# Patient Record
Sex: Male | Born: 1935 | Race: White | Hispanic: No | Marital: Married | Smoking: Former smoker
Health system: Southern US, Community
[De-identification: ages and names within clinical notes are randomized; demographics above are authoritative.]

## PROBLEM LIST (undated history)

## (undated) DIAGNOSIS — J45909 Unspecified asthma, uncomplicated: Secondary | ICD-10-CM

## (undated) DIAGNOSIS — D649 Anemia, unspecified: Secondary | ICD-10-CM

## (undated) DIAGNOSIS — C801 Malignant (primary) neoplasm, unspecified: Secondary | ICD-10-CM

## (undated) DIAGNOSIS — E785 Hyperlipidemia, unspecified: Secondary | ICD-10-CM

## (undated) DIAGNOSIS — E119 Type 2 diabetes mellitus without complications: Secondary | ICD-10-CM

## (undated) DIAGNOSIS — Z923 Personal history of irradiation: Secondary | ICD-10-CM

## (undated) HISTORY — DX: Unspecified asthma, uncomplicated: J45.909

## (undated) HISTORY — PX: EYE SURGERY: SHX253

## (undated) HISTORY — PX: ORIF ANKLE FRACTURE: SUR919

---

## 2001-03-15 ENCOUNTER — Encounter: Admission: RE | Admit: 2001-03-15 | Discharge: 2001-06-13 | Payer: Self-pay | Admitting: Internal Medicine

## 2001-06-07 ENCOUNTER — Ambulatory Visit (HOSPITAL_COMMUNITY): Admission: RE | Admit: 2001-06-07 | Discharge: 2001-06-07 | Payer: Self-pay | Admitting: Internal Medicine

## 2004-05-22 ENCOUNTER — Emergency Department (HOSPITAL_COMMUNITY): Admission: EM | Admit: 2004-05-22 | Discharge: 2004-05-22 | Payer: Self-pay | Admitting: Emergency Medicine

## 2004-05-24 ENCOUNTER — Inpatient Hospital Stay (HOSPITAL_COMMUNITY): Admission: RE | Admit: 2004-05-24 | Discharge: 2004-05-26 | Payer: Self-pay | Admitting: Orthopaedic Surgery

## 2007-07-24 ENCOUNTER — Emergency Department (HOSPITAL_COMMUNITY): Admission: EM | Admit: 2007-07-24 | Discharge: 2007-07-25 | Payer: Self-pay | Admitting: Emergency Medicine

## 2008-07-24 ENCOUNTER — Ambulatory Visit (HOSPITAL_COMMUNITY): Admission: RE | Admit: 2008-07-24 | Discharge: 2008-07-24 | Payer: Self-pay | Admitting: Internal Medicine

## 2009-11-17 ENCOUNTER — Ambulatory Visit (HOSPITAL_COMMUNITY): Admission: RE | Admit: 2009-11-17 | Discharge: 2009-11-17 | Payer: Self-pay | Admitting: Internal Medicine

## 2010-07-29 ENCOUNTER — Other Ambulatory Visit (HOSPITAL_COMMUNITY): Payer: Self-pay | Admitting: Internal Medicine

## 2010-07-29 ENCOUNTER — Ambulatory Visit (HOSPITAL_COMMUNITY)
Admission: RE | Admit: 2010-07-29 | Discharge: 2010-07-29 | Disposition: A | Discharge status: Home / Self Care | Payer: Medicare Other | Source: Ambulatory Visit | Attending: Internal Medicine | Admitting: Internal Medicine

## 2010-07-29 DIAGNOSIS — M545 Low back pain, unspecified: Secondary | ICD-10-CM | POA: Insufficient documentation

## 2010-07-29 DIAGNOSIS — M51379 Other intervertebral disc degeneration, lumbosacral region without mention of lumbar back pain or lower extremity pain: Secondary | ICD-10-CM | POA: Insufficient documentation

## 2010-07-29 DIAGNOSIS — M5137 Other intervertebral disc degeneration, lumbosacral region: Secondary | ICD-10-CM | POA: Insufficient documentation

## 2010-08-20 NOTE — Discharge Summary (Signed)
NAME:  Eric Brady, Eric Brady             ACCOUNT NO.:  [id number]   MEDICAL RECORD NO.:  [id number]          PATIENT TYPE:  INP   LOCATION:  A309                          FACILITY:  [facility]   PHYSICIAN:  J. Sanjuana Kava, M.D. DATE OF BIRTH:  08-Jul-1932   DATE OF ADMISSION:  05/24/2004  DATE OF DISCHARGE:  02/22/2006LH                                 DISCHARGE SUMMARY   CHIEF COMPLAINT:  Displaced medial malleolus fracture and nondisplaced  fracture of the lateral malleolus right ankle.  Noninsulin-dependent  diabetes mellitus.  Hyperlipidemia.   OPERATION:  Open treatment internal fixation right ankle fracture.   DISCHARGE DISPOSITION:  Patient is discharged home.  He is to follow up in  our office on June 08, 2004.  X-rays and plaster at that time.   DISCHARGE STATUS:  Improved.   DISCHARGE PROGNOSIS:  Good.   DISCHARGE MEDICATION:  Vicodin ES one tab q.4h. p.r.n. for pain.   BRIEF HISTORY AND PHYSICAL:  Please refer to the typed history and physical  in the patient's chart.   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  He was seen in the emergency room and was eventually referred to our office  for an evaluation on Monday of this week, it was noted at that time patient  had a displaced medial malleolus fracture, recommendation was that of  surgical treatment.  He also had a nondisplaced fracture of the fibula of  the right ankle.   Patient underwent open treatment internal fixation of the right ankle  fracture on May 24, 2004.  He tolerated procedure very well.  Postoperatively he was admitted to the hospital for pain control and further  instructions and physical therapy.   Patient has done well.  He had some complaints of dizziness and nausea every  time his head was elevated or he attempted to ambulate however, by the time  of discharge the patient was ambulating  toe-touch weightbearing 150 feet  with crutches with no assistance.   He remained afebrile in stable condition.   Patient was given the following instructions to continue ice and elevation  of the right lower extremity, crutch ambulation, no weightbearing to that  extremity, he is to keep it dry and clean, call the office with any  problems.      BC/MEDQ  D:  05/26/2004  T:  05/26/2004  Job:  [id number]

## 2010-08-20 NOTE — H&P (Signed)
NAME:  Eric Brady, Eric Brady             ACCOUNT NO.:  [id number]   MEDICAL RECORD NO.:  [id number]          PATIENT TYPE:  AMB   LOCATION:  DAY                           FACILITY:  [facility]   PHYSICIAN:  J. Sanjuana Kava, M.D. DATE OF BIRTH:  [birth year]   DATE OF ADMISSION:  05/24/2004  DATE OF DISCHARGE:  LH                                HISTORY & PHYSICAL   CHIEF COMPLAINT:  I hurt my ankle.   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  A cow kicked over a 4 x 6 gate, hit against his right ankle. He had pain and  tenderness. He had to take some cattle into [place], [place]. He  went ahead and did that and then came back home. Later that evening, he was  limping and having pain to his ankle. Complained about it during the night.  The next morning, he complained about it, and his wife brought him to the  hospital. X-rays revealed bimalleolar fracture of the right ankle with some  displacement of the medial malleolus. Put in a posterior splint, told to  come see me this morning. X-ray shows some separation medially over the  medial malleolus. Lateral malleolus is nondisplaced. Because of the  separation medially, he is going to need open treatment internal fixation. I  have explained this to the patient and his wife. Risks and imponderables of  the surgery are discussed including infection, pulmonary embolism, need for  physical therapy, no to limited weight bearing for 6 to 8 weeks.   PAST MEDICAL HISTORY:  Positive for diabetes, diet controlled. He denies  heart disease, lung disease, kidney disease, stroke, paralysis, TB,  rheumatic fever, cancer, polio, ulcer disease, or circulatory problems.   ALLERGIES:  He denies any allergies.   MEDICATIONS:  He is currently taking Lipitor 5 mg a day and one baby aspirin  a day.   SOCIAL HISTORY:  He does smoke, does not drink. 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 Dr. Willey Blade is his family doctor. The patient  lives in [place], and  he is married.   PAST SURGICAL HISTORY:  His only surgery is cataract surgery 6 or 7 years  ago.   FAMILY HISTORY:  Denies any diseases that run in the family.   PHYSICAL EXAMINATION:  VITAL SIGNS:  BP is 140/80, pulse 60, respiratory  rate 16, afebrile. Height 5 foot 10-1/2 inches, weight 183.  GENERAL:  The patient alert and cooperative, oriented.  HEENT:  Negative.  NECK:  Supple.  LUNGS:  Clear to P&A.  HEART:  Regular without murmur heard.  ABDOMEN:  Soft, nontender without masses.  EXTREMITIES:  Right lower leg swollen, nontender, ecchymotic. There is no  erythema. Neurovascular intact. Other extremities within normal limits. No  other apparent injuries.  CENTRAL NERVOUS SYSTEM:  Intact.  SKIN:  Intact.   IMPRESSION:  Bimalleolar fracture of right ankle.   PLAN:  Open treatment and internal fixation. Plan to do his surgery later  today. I have talked to the anesthesiologist, and by the time we do the  surgery, he had been NPO long enough to safely proceed. The patient did have  oatmeal early this morning. It will be greater than 6 hours when his surgery  is done. He knows about being NPO. He is going to be directly admitted to  the hospital.      JWK/MEDQ  D:  05/24/2004  T:  05/24/2004  Job:  [id number]

## 2010-08-20 NOTE — Op Note (Signed)
NAME:  Eric Brady, Eric Brady             ACCOUNT NO.:  [id number]   MEDICAL RECORD NO.:  [id number]          PATIENT TYPE:  AMB   LOCATION:  DAY                           FACILITY:  [facility]   PHYSICIAN:  J. Sanjuana Kava, M.D. DATE OF BIRTH:  1933-02-03   DATE OF PROCEDURE:  05/24/2004  DATE OF DISCHARGE:                                 OPERATIVE REPORT   PREOPERATIVE DIAGNOSIS:  Bimalleolar fracture of the right ankle.   POSTOPERATIVE DIAGNOSIS:  Bimalleolar fracture of the right ankle.   PROCEDURE:  Open reduction and internal treatment of the ankle on the right.   SURGEON:  J. Sanjuana Kava, M.D.   ASSISTANT:  Adolphus Birchwood, P.A.-C.   ANESTHESIA:  Spinal.   TOURNIQUET TIME:  9 minutes.   No drains.  Posterior splint applied.   INDICATION FOR PHYSICIAN ASSISTANT:  We are a small community hospital.  There is no house staff.  Physician assistant helps obtain placement of the  screw, casting.   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  He came to the office today.  The fracture  medially is displaced.  I saw him earlier this morning and I recommended  surgery, put him on the schedule.  Risks and imponderables were discussed in  detail.  Both the patient and his wife appeared to understand and agreed to  the procedure as outlined.   DESCRIPTION OF PROCEDURE:  The patient was seen in the holding area with his  wife.  The right leg has been marked previously by the patient.  I marked  it.  The patient was taken to the operating room, given spinal anesthesia,  placed supine on the operating table.  The tourniquet placed deflated on the  right upper leg.  The patient prepped and draped in the usual manner.  The  leg was elevated and wrapped circumferentially with an Esmarch bandage.  We  again identified the patient and the right ankle.  Esmarch removed.   A  medial incision was made with retraction, medial malleolus identified,  removed the hematoma and anatomically held it in place with the clamp.  A  0.062 smooth Kirschner wire was placed and then a 3.2 drill and then a 55 mm  malleolar screw was inserted.  The wound was reapproximated using 2-0  chromic, 2-0 plain and skin staples.  The tourniquet deflated after nine  minutes.  X-  ray showed excellent reduction of the fracture.  I did not fix the lateral  side because it was anatomically in place and I did not see that placing a  plate or screw would help that any more.  The patient placed in a sterile  dressing, posterior splint.  Tolerated the procedure well and will go to the  recovery room in good condition.      JWK/MEDQ  D:  05/24/2004  T:  05/24/2004  Job:  [id number]

## 2011-08-12 ENCOUNTER — Encounter (INDEPENDENT_AMBULATORY_CARE_PROVIDER_SITE_OTHER): Payer: Self-pay | Admitting: *Deleted

## 2012-08-02 ENCOUNTER — Telehealth (INDEPENDENT_AMBULATORY_CARE_PROVIDER_SITE_OTHER): Payer: Self-pay | Admitting: *Deleted

## 2012-08-02 ENCOUNTER — Other Ambulatory Visit (INDEPENDENT_AMBULATORY_CARE_PROVIDER_SITE_OTHER): Payer: Self-pay | Admitting: *Deleted

## 2012-08-02 ENCOUNTER — Encounter (INDEPENDENT_AMBULATORY_CARE_PROVIDER_SITE_OTHER): Payer: Self-pay | Admitting: *Deleted

## 2012-08-02 DIAGNOSIS — Z1211 Encounter for screening for malignant neoplasm of colon: Secondary | ICD-10-CM

## 2012-08-02 MED ORDER — PEG-KCL-NACL-NASULF-NA ASC-C 100 G PO SOLR: 1.0000 | Freq: Once | ORAL | Status: DC

## 2012-08-02 NOTE — Telephone Encounter (Signed)
Patient needs movi prep 

## 2012-08-15 ENCOUNTER — Telehealth (INDEPENDENT_AMBULATORY_CARE_PROVIDER_SITE_OTHER): Payer: Self-pay | Admitting: *Deleted

## 2012-08-15 NOTE — Telephone Encounter (Signed)
  Procedure: tcs  Reason/Indication:  screening  Has patient had this procedure before?  Yes, 10-11 yrs ago  If so, when, by whom and where?    Is there a family history of colon cancer?  no  Who?  What age when diagnosed?    Is patient diabetic?   yes      Does patient have prosthetic heart valve?  no  Do you have a pacemaker?  no  Has patient ever had endocarditis? no  Has patient had joint replacement within last 12 months?  no  Is patient on Coumadin, Plavix and/or Aspirin? yes  Medications: metformin 500 mg 2 in am & 2 in pm, atorvastatin 10 mg daily, asa 81 mg daily  Allergies: nkda  Medication Adjustment: asa 2 days, hold metformin evening before procedure and morning of procedure  Procedure date & time: 09/12/12 at 915

## 2012-08-16 NOTE — Telephone Encounter (Signed)
agree

## 2012-08-29 ENCOUNTER — Encounter (HOSPITAL_COMMUNITY): Payer: Self-pay | Admitting: Pharmacy Technician

## 2012-09-12 ENCOUNTER — Encounter (HOSPITAL_COMMUNITY)
Admission: RE | Disposition: A | Discharge status: Home / Self Care | Payer: Self-pay | Source: Ambulatory Visit | Attending: Internal Medicine

## 2012-09-12 ENCOUNTER — Encounter (HOSPITAL_COMMUNITY): Payer: Self-pay | Admitting: *Deleted

## 2012-09-12 ENCOUNTER — Ambulatory Visit (HOSPITAL_COMMUNITY)
Admission: RE | Admit: 2012-09-12 | Discharge: 2012-09-12 | Disposition: A | Discharge status: Home / Self Care | Payer: Medicare Other | Source: Ambulatory Visit | Attending: Internal Medicine | Admitting: Internal Medicine

## 2012-09-12 DIAGNOSIS — E785 Hyperlipidemia, unspecified: Secondary | ICD-10-CM | POA: Insufficient documentation

## 2012-09-12 DIAGNOSIS — K5730 Diverticulosis of large intestine without perforation or abscess without bleeding: Secondary | ICD-10-CM

## 2012-09-12 DIAGNOSIS — K644 Residual hemorrhoidal skin tags: Secondary | ICD-10-CM

## 2012-09-12 DIAGNOSIS — Z79899 Other long term (current) drug therapy: Secondary | ICD-10-CM | POA: Insufficient documentation

## 2012-09-12 DIAGNOSIS — Z87891 Personal history of nicotine dependence: Secondary | ICD-10-CM | POA: Insufficient documentation

## 2012-09-12 DIAGNOSIS — D126 Benign neoplasm of colon, unspecified: Secondary | ICD-10-CM

## 2012-09-12 DIAGNOSIS — Z7982 Long term (current) use of aspirin: Secondary | ICD-10-CM | POA: Insufficient documentation

## 2012-09-12 DIAGNOSIS — Z1211 Encounter for screening for malignant neoplasm of colon: Secondary | ICD-10-CM

## 2012-09-12 DIAGNOSIS — E119 Type 2 diabetes mellitus without complications: Secondary | ICD-10-CM | POA: Insufficient documentation

## 2012-09-12 DIAGNOSIS — D1779 Benign lipomatous neoplasm of other sites: Secondary | ICD-10-CM | POA: Insufficient documentation

## 2012-09-12 HISTORY — DX: Hyperlipidemia, unspecified: E78.5

## 2012-09-12 HISTORY — DX: Type 2 diabetes mellitus without complications: E11.9

## 2012-09-12 HISTORY — PX: COLONOSCOPY: SHX5424

## 2012-09-12 LAB — GLUCOSE, CAPILLARY: Glucose-Capillary: 89 mg/dL (ref 70–99)

## 2012-09-12 SURGERY — COLONOSCOPY
Anesthesia: Moderate Sedation

## 2012-09-12 MED ORDER — MEPERIDINE HCL 50 MG/ML IJ SOLN
INTRAMUSCULAR | Status: DC | PRN
  Administered 2012-09-12: 25 mg via INTRAVENOUS

## 2012-09-12 MED ORDER — MIDAZOLAM HCL 5 MG/5ML IJ SOLN
INTRAMUSCULAR | Status: DC | PRN
  Administered 2012-09-12: 1 mg via INTRAVENOUS
  Administered 2012-09-12: 2 mg via INTRAVENOUS

## 2012-09-12 MED ORDER — MIDAZOLAM HCL 5 MG/5ML IJ SOLN
INTRAMUSCULAR | Status: AC
  Filled 2012-09-12: qty 10

## 2012-09-12 MED ORDER — MEPERIDINE HCL 50 MG/ML IJ SOLN
INTRAMUSCULAR | Status: AC
  Filled 2012-09-12: qty 1

## 2012-09-12 MED ORDER — STERILE WATER FOR IRRIGATION IR SOLN
Status: DC | PRN
  Administered 2012-09-12: 09:00:00

## 2012-09-12 MED ORDER — SODIUM CHLORIDE 0.9 % IV SOLN
INTRAVENOUS | Status: DC
  Administered 2012-09-12: 09:00:00 via INTRAVENOUS

## 2012-09-12 NOTE — H&P (Signed)
Eric Brady is an 79 y.o. male.   Chief Complaint: Patient is here for colonoscopy. HPI: Patient is 79 year old Caucasian male who presents for screening colonoscopy. His last exam was in 2003. He denies abdominal pain change in his bowel habits or rectal bleeding. Family history is negative for CRC.  Past Medical History  Diagnosis Date  . Hyperlipidemia   . Diabetes mellitus without complication     Past Surgical History  Procedure Laterality Date  . Orif ankle fracture Right     History reviewed. No pertinent family history. Social History:  reports that he has quit smoking. He does not have any smokeless tobacco history on file. He reports that he does not drink alcohol or use illicit drugs.  Allergies: No Known Allergies  Medications Prior to Admission  Medication Sig Dispense Refill  . aspirin EC 81 MG tablet Take 81 mg by mouth daily.      Marland Kitchen atorvastatin (LIPITOR) 10 MG tablet Take 10 mg by mouth daily.      . metFORMIN (GLUCOPHAGE) 500 MG tablet Take 1,000 mg by mouth 2 (two) times daily with a meal.        Results for orders placed during the hospital encounter of 09/12/12 (from the past 48 hour(s))  GLUCOSE, CAPILLARY     Status: None   Collection Time    09/12/12  8:36 AM      Result Value Range   Glucose-Capillary 89  70 - 99 mg/dL   No results found.  ROS  Blood pressure 143/88, pulse 46, temperature 98 F (36.7 C), resp. rate 18, height 5\' 11"  (1.803 m), weight 180 lb (81.647 kg), SpO2 99.00%. Physical Exam  Constitutional: He appears well-developed and well-nourished.  HENT:  Mouth/Throat: Oropharynx is clear and moist.  Eyes: Conjunctivae are normal. No scleral icterus.  Neck: No thyromegaly present.  Cardiovascular: Normal rate, regular rhythm and normal heart sounds.   No murmur heard. Respiratory: Effort normal and breath sounds normal.  GI: Soft. He exhibits no distension and no mass. There is no tenderness.  Musculoskeletal: He exhibits no  edema.  Lymphadenopathy:    He has no cervical adenopathy.  Neurological: He is alert.  Skin: Skin is warm and dry.     Assessment/Plan Average risk screening colonoscopy.  Brady,Eric U 09/12/2012, 9:28 AM

## 2012-09-12 NOTE — Op Note (Signed)
COLONOSCOPY PROCEDURE REPORT  PATIENT:  Eric Brady  MR#:  [id number] Birthdate:  06/20/1932, 79 y.o., male Endoscopist:  Dr. Rogene Houston, MD Referred By:  Dr. Asencion Noble, MD  Procedure Date: 09/12/2012  Procedure:   Colonoscopy  Indications:  Patient is 79 year old Caucasian male who is here for average risk screening colonoscopy. His last exam was in 2003.  Informed Consent:  The procedure and risks were reviewed with the patient and informed consent was obtained.  Medications:  Demerol 25 mg IV Versed 3 mg IV  Description of procedure:  After a digital rectal exam was performed, that colonoscope was advanced from the anus through the rectum and colon to the area of the cecum, ileocecal valve and appendiceal orifice. The cecum was deeply intubated. These structures were well-seen and photographed for the record. From the level of the cecum and ileocecal valve, the scope was slowly and cautiously withdrawn. The mucosal surfaces were carefully surveyed utilizing scope tip to flexion to facilitate fold flattening as needed. The scope was pulled down into the rectum where a thorough exam including retroflexion was performed.  Findings:   Prep excellent. Few scattered diverticula throughout the colon. 12 dimeter submucosal lipoma noted at ascending colon above the ileocecal valve. Small cecal polyp ablated via cold biopsy. 5 mm polyp in transverse colon cold snared. Both of these polyps were submitted together. Normal rectal mucosa. Small hemorrhoids below the dentate line.    Therapeutic/Diagnostic Maneuvers Performed:  See above  Complications:  None  Cecal Withdrawal Time:  12 minutes  Impression:  Examination performed to cecum. Pancolonic diverticulosis. 12 mm submucosal lipoma at ascending colon. Two small polyps were removed and submitted together; one from cecum removed via cold biopsy and the second one was cold snared from transverse colon.  Recommendations:   Standard instructions given. I will contact patient with biopsy results and further recommendations.  [name]  09/12/2012 10:09 AM  CC: Dr. Asencion Noble, MD & Dr. Rayne Du ref. provider found

## 2012-09-13 ENCOUNTER — Encounter (HOSPITAL_COMMUNITY): Payer: Self-pay | Admitting: Internal Medicine

## 2012-09-19 ENCOUNTER — Encounter (INDEPENDENT_AMBULATORY_CARE_PROVIDER_SITE_OTHER): Payer: Self-pay | Admitting: *Deleted

## 2014-05-29 ENCOUNTER — Other Ambulatory Visit (HOSPITAL_COMMUNITY): Payer: Self-pay | Admitting: Internal Medicine

## 2014-05-29 ENCOUNTER — Ambulatory Visit (HOSPITAL_COMMUNITY)
Admission: RE | Admit: 2014-05-29 | Discharge: 2014-05-29 | Disposition: A | Discharge status: Home / Self Care | Payer: Medicare Other | Source: Ambulatory Visit | Attending: Internal Medicine | Admitting: Internal Medicine

## 2014-05-29 DIAGNOSIS — W109XXA Fall (on) (from) unspecified stairs and steps, initial encounter: Secondary | ICD-10-CM | POA: Diagnosis not present

## 2014-05-29 DIAGNOSIS — M542 Cervicalgia: Secondary | ICD-10-CM

## 2014-05-29 DIAGNOSIS — W19XXXA Unspecified fall, initial encounter: Secondary | ICD-10-CM

## 2014-05-29 DIAGNOSIS — M479 Spondylosis, unspecified: Secondary | ICD-10-CM | POA: Insufficient documentation

## 2014-11-10 ENCOUNTER — Other Ambulatory Visit (HOSPITAL_COMMUNITY): Payer: Self-pay | Admitting: Internal Medicine

## 2014-11-10 DIAGNOSIS — R748 Abnormal levels of other serum enzymes: Secondary | ICD-10-CM

## 2014-11-11 ENCOUNTER — Ambulatory Visit (HOSPITAL_COMMUNITY)
Admission: RE | Admit: 2014-11-11 | Discharge: 2014-11-11 | Disposition: A | Discharge status: Home / Self Care | Payer: Medicare Other | Source: Ambulatory Visit | Attending: Internal Medicine | Admitting: Internal Medicine

## 2014-11-11 ENCOUNTER — Other Ambulatory Visit (HOSPITAL_COMMUNITY): Payer: Self-pay | Admitting: Internal Medicine

## 2014-11-11 DIAGNOSIS — N2889 Other specified disorders of kidney and ureter: Secondary | ICD-10-CM | POA: Diagnosis not present

## 2014-11-11 DIAGNOSIS — R748 Abnormal levels of other serum enzymes: Secondary | ICD-10-CM

## 2014-11-11 DIAGNOSIS — R944 Abnormal results of kidney function studies: Secondary | ICD-10-CM | POA: Diagnosis present

## 2014-11-11 DIAGNOSIS — R531 Weakness: Secondary | ICD-10-CM | POA: Insufficient documentation

## 2014-11-11 DIAGNOSIS — R0602 Shortness of breath: Secondary | ICD-10-CM

## 2014-11-12 ENCOUNTER — Other Ambulatory Visit (HOSPITAL_COMMUNITY): Payer: Self-pay | Admitting: Internal Medicine

## 2014-11-12 DIAGNOSIS — N2889 Other specified disorders of kidney and ureter: Secondary | ICD-10-CM

## 2014-11-12 DIAGNOSIS — R42 Dizziness and giddiness: Secondary | ICD-10-CM

## 2014-11-14 ENCOUNTER — Ambulatory Visit (HOSPITAL_COMMUNITY)
Admission: RE | Admit: 2014-11-14 | Discharge: 2014-11-14 | Disposition: A | Discharge status: Home / Self Care | Payer: Medicare Other | Source: Ambulatory Visit | Attending: Internal Medicine | Admitting: Internal Medicine

## 2014-11-14 DIAGNOSIS — K5730 Diverticulosis of large intestine without perforation or abscess without bleeding: Secondary | ICD-10-CM | POA: Diagnosis not present

## 2014-11-14 DIAGNOSIS — N323 Diverticulum of bladder: Secondary | ICD-10-CM | POA: Insufficient documentation

## 2014-11-14 DIAGNOSIS — N2889 Other specified disorders of kidney and ureter: Secondary | ICD-10-CM | POA: Diagnosis present

## 2014-11-14 DIAGNOSIS — R42 Dizziness and giddiness: Secondary | ICD-10-CM

## 2014-11-14 MED ORDER — SODIUM CHLORIDE 0.9 % IJ SOLN
INTRAMUSCULAR | Status: AC
  Filled 2014-11-14: qty 500

## 2014-11-14 MED ORDER — SODIUM CHLORIDE 0.9 % IJ SOLN
INTRAMUSCULAR | Status: AC
  Filled 2014-11-14: qty 30

## 2014-11-14 MED ORDER — IOHEXOL 300 MG/ML  SOLN
80.0000 mL | Freq: Once | INTRAMUSCULAR | Status: AC | PRN
  Administered 2014-11-14: 80 mL via INTRAVENOUS

## 2014-12-09 ENCOUNTER — Other Ambulatory Visit (HOSPITAL_COMMUNITY): Payer: Self-pay | Admitting: Internal Medicine

## 2014-12-09 ENCOUNTER — Ambulatory Visit (HOSPITAL_COMMUNITY)
Admission: RE | Admit: 2014-12-09 | Discharge: 2014-12-09 | Disposition: A | Discharge status: Home / Self Care | Payer: Medicare Other | Source: Ambulatory Visit | Attending: Internal Medicine | Admitting: Internal Medicine

## 2014-12-09 DIAGNOSIS — R262 Difficulty in walking, not elsewhere classified: Secondary | ICD-10-CM | POA: Insufficient documentation

## 2014-12-09 DIAGNOSIS — R410 Disorientation, unspecified: Secondary | ICD-10-CM

## 2014-12-09 DIAGNOSIS — R4182 Altered mental status, unspecified: Secondary | ICD-10-CM | POA: Insufficient documentation

## 2014-12-09 DIAGNOSIS — R42 Dizziness and giddiness: Secondary | ICD-10-CM | POA: Insufficient documentation

## 2014-12-09 DIAGNOSIS — I6789 Other cerebrovascular disease: Secondary | ICD-10-CM | POA: Insufficient documentation

## 2014-12-09 DIAGNOSIS — R269 Unspecified abnormalities of gait and mobility: Secondary | ICD-10-CM

## 2014-12-26 ENCOUNTER — Encounter (HOSPITAL_COMMUNITY): Payer: Self-pay | Admitting: *Deleted

## 2014-12-26 ENCOUNTER — Emergency Department (HOSPITAL_COMMUNITY)
Admission: EM | Admit: 2014-12-26 | Discharge: 2014-12-26 | Disposition: A | Discharge status: Home / Self Care | Payer: Medicare Other | Attending: Emergency Medicine | Admitting: Emergency Medicine

## 2014-12-26 DIAGNOSIS — E785 Hyperlipidemia, unspecified: Secondary | ICD-10-CM | POA: Diagnosis not present

## 2014-12-26 DIAGNOSIS — Z79899 Other long term (current) drug therapy: Secondary | ICD-10-CM | POA: Insufficient documentation

## 2014-12-26 DIAGNOSIS — Z87891 Personal history of nicotine dependence: Secondary | ICD-10-CM | POA: Insufficient documentation

## 2014-12-26 DIAGNOSIS — E119 Type 2 diabetes mellitus without complications: Secondary | ICD-10-CM | POA: Diagnosis not present

## 2014-12-26 DIAGNOSIS — R29898 Other symptoms and signs involving the musculoskeletal system: Secondary | ICD-10-CM

## 2014-12-26 DIAGNOSIS — R531 Weakness: Secondary | ICD-10-CM | POA: Diagnosis present

## 2014-12-26 DIAGNOSIS — M6281 Muscle weakness (generalized): Secondary | ICD-10-CM | POA: Diagnosis not present

## 2014-12-26 LAB — BASIC METABOLIC PANEL
ANION GAP: 7 (ref 5–15)
BUN: 19 mg/dL (ref 6–20)
CHLORIDE: 109 mmol/L (ref 101–111)
CO2: 26 mmol/L (ref 22–32)
Calcium: 8.8 mg/dL — ABNORMAL LOW (ref 8.9–10.3)
Creatinine, Ser: 1.34 mg/dL — ABNORMAL HIGH (ref 0.61–1.24)
GFR calc Af Amer: 56 mL/min — ABNORMAL LOW (ref >60)
GFR, EST NON AFRICAN AMERICAN: 49 mL/min — AB (ref >60)
GLUCOSE: 170 mg/dL — AB (ref 65–99)
POTASSIUM: 3.9 mmol/L (ref 3.5–5.1)
Sodium: 142 mmol/L (ref 135–145)

## 2014-12-26 LAB — CBC WITH DIFFERENTIAL/PLATELET
BASOS ABS: 0 10*3/uL (ref 0.0–0.1)
Basophils Relative: 0 %
EOS PCT: 1 %
Eosinophils Absolute: 0.1 10*3/uL (ref 0.0–0.7)
HEMATOCRIT: 34.5 % — AB (ref 39.0–52.0)
HEMOGLOBIN: 10.8 g/dL — AB (ref 13.0–17.0)
LYMPHS ABS: 2.5 10*3/uL (ref 0.7–4.0)
LYMPHS PCT: 29 %
MCH: 27.5 pg (ref 26.0–34.0)
MCHC: 31.3 g/dL (ref 30.0–36.0)
MCV: 87.8 fL (ref 78.0–100.0)
Monocytes Absolute: 0.7 10*3/uL (ref 0.1–1.0)
Monocytes Relative: 8 %
NEUTROS ABS: 5.3 10*3/uL (ref 1.7–7.7)
NEUTROS PCT: 62 %
PLATELETS: 142 10*3/uL — AB (ref 150–400)
RBC: 3.93 MIL/uL — AB (ref 4.22–5.81)
RDW: 14 % (ref 11.5–15.5)
WBC: 8.5 10*3/uL (ref 4.0–10.5)

## 2014-12-26 LAB — CBG MONITORING, ED: GLUCOSE-CAPILLARY: 164 mg/dL — AB (ref 65–99)

## 2014-12-26 NOTE — Discharge Instructions (Signed)
Hypotonia Hypotonia is decreased muscle tone. Muscle tone is the amount of tension or resistance to movement in a muscle while at rest. It is different from muscle strength which is how much force a muscle can apply. For example, you may be able to do sit-ups which are a measure of abdominal muscle strength but if the belly is not firm then there is low muscle tone. Many people with hypotonia also have muscle weakness. Hypotonic infants are also called "floppy infants" because the low muscle tone and decreased strength of the neck and trunk muscles make the children floppy like a rag doll. CAUSES  Hypotonia is caused by a problem in one of the following areas of the body:  Brain.  Spinal cord.  Nerves.  Junction between the nerves and muscle.  Muscle. SYMPTOMS IN FLOPPY INFANTS  Delay in motor skills and development.  Shortness of breath or fast shallow breaths.  Poor sucking ability leading to poor weight gain.  Decreased alertness.  Joints can be bent further than expected due to ligament and joint laxity.  Hypotonia may be associated with disorders that can affect intellect and learning. SYMPTOMS IN OLDER CHILDREN / ADULTS  Low muscle tone and/or weakness which may be over the entire body or affect only a specific region.  Fluctuating muscle strength.  Weight loss due to fatigue during eating.  Double vision due to weakness of the eye muscles.  Decreased energy. DIAGNOSIS  Depending upon how and at what age the patient develops hypotonia, the pattern of weakness, and physical examination, your physician may order the following tests to find out which part of the area of the body is involved :  Magnetic Resonance Imaging (MRI) of the Brain and/or Spinal Cord - a way to look at the structure of the brain and spinal cord without radiation.  Electromyogram with Nerve Conduction Studies (EMG with NCS) - a way to evaluate the function of the nerves, junction between the nerves  and muscle and muscle. This test may be painful, but it is the only way to test for many different diseases.  Based on the above tests, further testing may be done to narrow down the exact cause of why that part of the body is not working properly. Depending on the cause, the hypotonia may be a short term problem that is treatable or a life-long problem that is untreatable or something in-between. TREATMENT  Treatment will be based on the disease that caused the hypotonia first. This is accompanied by symptomatic and supportive therapy for the hypotonia. Physical therapy can improve fine and movement (gross motor) control and overall body strength. Occupational therapy focuses on fine motor skills and basic skills for life. Speech therapy focuses on speech and swallowing difficulties.  Document Released: 03/11/2002 Document Revised: 06/13/2011 Document Reviewed: 10/14/2008 [facility] Patient Information 2015 [place], [place]. This information is not intended to replace advice given to you by your health care provider. Make sure you discuss any questions you have with your health care provider.

## 2014-12-26 NOTE — ED Provider Notes (Signed)
CSN: [id number]     Arrival date & time 12/26/14  2116 History  This chart was scribed for Deno Etienne, DO by Irene Pap, ED Scribe. This patient was seen in room APA05/APA05 and patient care was started at 9:29 PM.    Chief Complaint  Patient presents with  . Weakness   Patient is a 81 y.o. male presenting with weakness. The history is provided by the patient and the spouse. No language interpreter was used.  Weakness This is a new problem. The current episode started 1 to 2 hours ago. The problem occurs constantly. The problem has been gradually improving. Pertinent negatives include no chest pain, no abdominal pain, no headaches and no shortness of breath. He has tried nothing for the symptoms.   HPI Comments: Eric Brady is a 81 y.o. male with hx of DM who presents to the Emergency Department complaining of weakness onset one hour ago. Pt states that his wife made him come in because at 8 PM, he was unable to move or walk. Wife states that she was able to get him to his bed after he complained of weakness. He states that he has not tried to walk since, but feels like he is returning to baseline. Pt states that he has no prior hx of similar symptoms but wife states that he has a B12 deficiency which may contribute to his "wobbly" gait. Pt is followed by a neurologist at [facility]. Wife states that pt went outside to fix the fence and came back inside with deer ticks on his legs. He denies arm weakness, headache, neck pain, back pain, chest pain, leg pain, dizziness, lightheadedness, nausea, or vomiting.   Past Medical History  Diagnosis Date  . Hyperlipidemia   . Diabetes mellitus without complication    Past Surgical History  Procedure Laterality Date  . Orif ankle fracture Right   . Colonoscopy N/A 09/12/2012    Procedure: COLONOSCOPY;  Surgeon: Rogene Houston, MD;  Location: [facility];  Service: Endoscopy;  Laterality: N/A;  915   History reviewed. No pertinent family  history. Social History  Substance Use Topics  . Smoking status: Former Research scientist (life sciences)  . Smokeless tobacco: None  . Alcohol Use: No    Review of Systems  Constitutional: Negative for fever and chills.  HENT: Negative for congestion and facial swelling.   Eyes: Negative for discharge and visual disturbance.  Respiratory: Negative for shortness of breath.   Cardiovascular: Negative for chest pain and palpitations.  Gastrointestinal: Negative for nausea, vomiting, abdominal pain and diarrhea.  Musculoskeletal: Negative for myalgias, back pain, arthralgias and neck pain.  Skin: Negative for color change and rash.  Neurological: Positive for weakness. Negative for dizziness, tremors, syncope, numbness and headaches.  Psychiatric/Behavioral: Negative for confusion and dysphoric mood.   Allergies  Review of patient's allergies indicates no known allergies.  Home Medications   Prior to Admission medications   Medication Sig Start Date End Date Taking? Authorizing Provider  atorvastatin (LIPITOR) 10 MG tablet Take 10 mg by mouth daily.   Yes Historical Provider, MD   BP 152/72 mmHg  Pulse 60  SpO2 98% Physical Exam  Constitutional: He is oriented to person, place, and time. He appears well-developed and well-nourished.  HENT:  Head: Normocephalic and atraumatic.  Eyes: Conjunctivae and EOM are normal. Pupils are equal, round, and reactive to light.  Neck: Normal range of motion. Neck supple.  Cardiovascular: Normal rate and regular rhythm.  Exam reveals no gallop and  no friction rub.   No murmur heard. Pulmonary/Chest: Effort normal and breath sounds normal.  Abdominal: Soft. Bowel sounds are normal. There is no tenderness. There is no rebound and no guarding.  Musculoskeletal: Normal range of motion.  Neurological: He is alert and oriented to person, place, and time.  Skin: Skin is warm and dry.  Small tick noted to anterior aspect of left shin  Psychiatric: He has a normal mood and  affect. His behavior is normal.  Nursing note and vitals reviewed.   ED Course  Procedures (including critical care time) DIAGNOSTIC STUDIES: Oxygen Saturation is 98% on RA, normal by my interpretation.    COORDINATION OF CARE: 9:35 PM-Discussed treatment plan which includes labs with pt at bedside and pt agreed to plan.   Labs Review Labs Reviewed  CBC WITH DIFFERENTIAL/PLATELET - Abnormal; Notable for the following:    RBC 3.93 (*)    Hemoglobin 10.8 (*)    HCT 34.5 (*)    Platelets 142 (*)    All other components within normal limits  BASIC METABOLIC PANEL - Abnormal; Notable for the following:    Glucose, Bld 170 (*)    Creatinine, Ser 1.34 (*)    Calcium 8.8 (*)    GFR calc non Af Amer 49 (*)    GFR calc Af Amer 56 (*)    All other components within normal limits  CBG MONITORING, ED - Abnormal; Notable for the following:    Glucose-Capillary 164 (*)    All other components within normal limits    Imaging Review No results found.    EKG Interpretation None      MDM   Final diagnoses:  Weakness of both legs    81 yo M with a chief complaint of bilateral lower extremity weakness. This occurred suddenly and lasted about 20 minutes. Spontaneously resolved. Patient currently being worked up for possible B12 deficiency. Has seen multiple neurologists without complete etiology. Patient having a wide-based gait chronically. Today patient was outside working on a fence. Wife thinks maybe he overdid it is why he became suddenly weak. Found to have a tick on his leg. Possibility of tickborne paralysis. Patient with unremarkable CBC and BMP. Patient able to ambulate without difficulty in the ED. Family will follow with their neurologist. I personally performed the services described in this documentation, which was scribed in my presence. The recorded information has been reviewed and is accurate.    I have discussed the diagnosis/risks/treatment options with the patient and  family and believe the pt to be eligible for discharge home to follow-up with Neurologist. We also discussed returning to the ED immediately if new or worsening sx occur. We discussed the sx which are most concerning (e.g., recurrent event) that necessitate immediate return. Medications administered to the patient during their visit and any new prescriptions provided to the patient are listed below.  Medications given during this visit Medications - No data to display  Discharge Medication List as of 12/26/2014 10:45 PM       The patient appears reasonably screen and/or stabilized for discharge and I doubt any other medical condition or other [facility] requiring further screening, evaluation, or treatment in the ED at this time prior to discharge.       Deno Etienne, DO 12/27/14 0006

## 2014-12-26 NOTE — ED Notes (Signed)
Pt states that he was dizzy earlier tonight but feeling better now.  Just feeling a little weak.

## 2014-12-26 NOTE — ED Notes (Signed)
Family reports that pt was outside fixing fences and moving cattle today, and "maybe just overdid it."  Denies adequate fluid intake.

## 2014-12-26 NOTE — ED Notes (Signed)
Pt to department via [facility]. Per report, family noticed a sudden onset about 8pm right sided weakness.  Symptoms resolving at this time.  Pt has equal grip strength. AAOX4.  Speech slightly slurred, but clearing.  Very faint right sided facial droop, per EMS, vast improvement in past 20 minutes.

## 2014-12-26 NOTE — ED Notes (Signed)
Pt tolerated ambulation well.  Slightly unstable on feet at times but quickly corrected balance on own

## 2014-12-26 NOTE — ED Notes (Signed)
Patient given discharge instruction, verbalized understand. IV removed, band aid applied. Patient ambulatory out of the department.  

## 2015-02-07 ENCOUNTER — Encounter (HOSPITAL_COMMUNITY): Payer: Self-pay | Admitting: *Deleted

## 2015-02-07 ENCOUNTER — Emergency Department (HOSPITAL_COMMUNITY): Payer: Medicare Other

## 2015-02-07 ENCOUNTER — Emergency Department (HOSPITAL_COMMUNITY)
Admission: EM | Admit: 2015-02-07 | Discharge: 2015-02-07 | Disposition: A | Discharge status: Home / Self Care | Payer: Medicare Other | Attending: Emergency Medicine | Admitting: Emergency Medicine

## 2015-02-07 DIAGNOSIS — R001 Bradycardia, unspecified: Secondary | ICD-10-CM | POA: Diagnosis not present

## 2015-02-07 DIAGNOSIS — E785 Hyperlipidemia, unspecified: Secondary | ICD-10-CM | POA: Diagnosis not present

## 2015-02-07 DIAGNOSIS — Z79899 Other long term (current) drug therapy: Secondary | ICD-10-CM | POA: Diagnosis not present

## 2015-02-07 DIAGNOSIS — R404 Transient alteration of awareness: Secondary | ICD-10-CM | POA: Insufficient documentation

## 2015-02-07 DIAGNOSIS — Z862 Personal history of diseases of the blood and blood-forming organs and certain disorders involving the immune mechanism: Secondary | ICD-10-CM | POA: Insufficient documentation

## 2015-02-07 DIAGNOSIS — Z87891 Personal history of nicotine dependence: Secondary | ICD-10-CM | POA: Insufficient documentation

## 2015-02-07 DIAGNOSIS — R42 Dizziness and giddiness: Secondary | ICD-10-CM | POA: Insufficient documentation

## 2015-02-07 DIAGNOSIS — R410 Disorientation, unspecified: Secondary | ICD-10-CM | POA: Diagnosis present

## 2015-02-07 HISTORY — DX: Anemia, unspecified: D64.9

## 2015-02-07 LAB — CBC WITH DIFFERENTIAL/PLATELET
BASOS ABS: 0 10*3/uL (ref 0.0–0.1)
BASOS PCT: 0 %
EOS ABS: 0.1 10*3/uL (ref 0.0–0.7)
EOS PCT: 2 %
HCT: 36.2 % — ABNORMAL LOW (ref 39.0–52.0)
HEMOGLOBIN: 11.6 g/dL — AB (ref 13.0–17.0)
LYMPHS ABS: 2.4 10*3/uL (ref 0.7–4.0)
Lymphocytes Relative: 39 %
MCH: 28 pg (ref 26.0–34.0)
MCHC: 32 g/dL (ref 30.0–36.0)
MCV: 87.2 fL (ref 78.0–100.0)
Monocytes Absolute: 0.5 10*3/uL (ref 0.1–1.0)
Monocytes Relative: 9 %
NEUTROS PCT: 50 %
Neutro Abs: 3 10*3/uL (ref 1.7–7.7)
PLATELETS: 150 10*3/uL (ref 150–400)
RBC: 4.15 MIL/uL — AB (ref 4.22–5.81)
RDW: 13.8 % (ref 11.5–15.5)
WBC: 6 10*3/uL (ref 4.0–10.5)

## 2015-02-07 LAB — BASIC METABOLIC PANEL
ANION GAP: 7 (ref 5–15)
BUN: 19 mg/dL (ref 6–20)
CALCIUM: 9.1 mg/dL (ref 8.9–10.3)
CO2: 27 mmol/L (ref 22–32)
Chloride: 108 mmol/L (ref 101–111)
Creatinine, Ser: 1.03 mg/dL (ref 0.61–1.24)
GLUCOSE: 159 mg/dL — AB (ref 65–99)
POTASSIUM: 4 mmol/L (ref 3.5–5.1)
Sodium: 142 mmol/L (ref 135–145)

## 2015-02-07 LAB — URINALYSIS, ROUTINE W REFLEX MICROSCOPIC
BILIRUBIN URINE: NEGATIVE
Glucose, UA: NEGATIVE mg/dL
Hgb urine dipstick: NEGATIVE
KETONES UR: NEGATIVE mg/dL
LEUKOCYTES UA: NEGATIVE
NITRITE: NEGATIVE
PROTEIN: NEGATIVE mg/dL
Specific Gravity, Urine: <1.005 — ABNORMAL LOW (ref 1.005–1.030)
UROBILINOGEN UA: 0.2 mg/dL (ref 0.0–1.0)
pH: 6 (ref 5.0–8.0)

## 2015-02-07 LAB — CBG MONITORING, ED: GLUCOSE-CAPILLARY: 140 mg/dL — AB (ref 65–99)

## 2015-02-07 LAB — TROPONIN I: Troponin I: <0.03 ng/mL (ref <0.031)

## 2015-02-07 MED ORDER — SODIUM CHLORIDE 0.9 % IV BOLUS (SEPSIS)
1000.0000 mL | Freq: Once | INTRAVENOUS | Status: AC
  Administered 2015-02-07: 1000 mL via INTRAVENOUS

## 2015-02-07 NOTE — ED Notes (Signed)
Wife states AMS, stating becoming confused today. States he missed his B12 shot and he may be dehydrated. States he gets confused due to both

## 2015-02-07 NOTE — Discharge Instructions (Signed)
If you were given medicines take as directed.  If you are on coumadin or contraceptives realize their levels and effectiveness is altered by many different medicines.  If you have any reaction (rash, tongues swelling, other) to the medicines stop taking and see a physician.    If your blood pressure was elevated in the ER make sure you follow up for management with a primary doctor or return for chest pain, shortness of breath or stroke symptoms.  Please follow up as directed and return to the ER or see a physician for new or worsening symptoms.  Thank you. Filed Vitals:   02/07/15 1551 02/07/15 1630 02/07/15 1704 02/07/15 1805  BP: 137/69 149/61 145/65 154/60  Pulse: 51 48 46 50  Temp: 97.7 F (36.5 C)     Resp: 16  22 20   Height: 5\' 11"  (1.803 m)     Weight: 174 lb (78.926 kg)     SpO2: 98% 98% 97% 100%

## 2015-02-07 NOTE — ED Provider Notes (Signed)
CSN: [id number]     Arrival date & time 02/07/15  1540 History   First MD Initiated Contact with Patient 02/07/15 1619     Chief Complaint  Patient presents with  . Altered Mental Status     (Consider location/radiation/quality/duration/timing/severity/associated sxs/prior Treatment) HPI Comments: 81 year old male past smoker, history of type 2 diabetes, B 12 deficiency/anemia, lipids presents with his wife for episode of confusion around 3:00 this afternoon. Patient missed his last B-12 shot and has decreased by mouth intake recently. No fevers or chills. Patient had transient episode approximately lasting one hour of confusion and dizziness. No stroke history. Patient has had this multiple times in the past however has never been diagnosed with a stroke. Patient has had an MRI and blood work in the past. Patient follows up closely outpatient  Patient is a 81 y.o. male presenting with altered mental status. The history is provided by the patient and the spouse.  Altered Mental Status Presenting symptoms: confusion   Associated symptoms: no abdominal pain, no fever, no headaches, no light-headedness, no rash and no vomiting     Past Medical History  Diagnosis Date  . Hyperlipidemia   . Diabetes mellitus without complication ([place])   . Anemia    Past Surgical History  Procedure Laterality Date  . Orif ankle fracture Right   . Colonoscopy N/A 09/12/2012    Procedure: COLONOSCOPY;  Surgeon: Rogene Houston, MD;  [facility];  Service: Endoscopy;  Laterality: N/A;  915   No family history on file. Social History  Substance Use Topics  . Smoking status: Former Research scientist (life sciences)  . Smokeless tobacco: None  . Alcohol Use: No    Review of Systems  Constitutional: Negative for fever and chills.  HENT: Negative for congestion.   Eyes: Negative for visual disturbance.  Respiratory: Negative for shortness of breath.   Cardiovascular: Negative for chest pain.  Gastrointestinal: Negative  for vomiting and abdominal pain.  Genitourinary: Negative for dysuria and flank pain.  Musculoskeletal: Negative for back pain, neck pain and neck stiffness.  Skin: Negative for rash.  Neurological: Positive for dizziness. Negative for light-headedness and headaches.  Psychiatric/Behavioral: Positive for confusion.      Allergies  Review of patient's allergies indicates no known allergies.  Home Medications   Prior to Admission medications   Medication Sig Start Date End Date Taking? Authorizing Provider  atorvastatin (LIPITOR) 10 MG tablet Take 10 mg by mouth daily.   Yes Historical Provider, MD  cyanocobalamin (,VITAMIN B-12,) 1000 MCG/ML injection Inject 1,000 mcg into the muscle every 30 (thirty) days.   Yes Historical Provider, MD  metFORMIN (GLUCOPHAGE-XR) 500 MG 24 hr tablet Take 500 mg by mouth daily. 01/28/15  Yes Historical Provider, MD   BP 154/60 mmHg  Pulse 50  Temp(Src) 97.7 F (36.5 C)  Resp 20  Ht 5\' 11"  (1.803 m)  Wt 174 lb (78.926 kg)  BMI 24.28 kg/m2  SpO2 100% Physical Exam  Constitutional: He is oriented to person, place, and time. He appears well-developed and well-nourished.  HENT:  Head: Normocephalic and atraumatic.  Mild dry meters membranes  Eyes: Conjunctivae are normal. Right eye exhibits no discharge. Left eye exhibits no discharge.  Neck: Normal range of motion. Neck supple. No tracheal deviation present.  Cardiovascular: Regular rhythm.  Bradycardia present.   Pulmonary/Chest: Effort normal and breath sounds normal.  Abdominal: Soft. He exhibits no distension. There is no tenderness. There is no guarding.  Musculoskeletal: He exhibits no edema.  Neurological:  He is alert and oriented to person, place, and time. GCS eye subscore is 4. GCS verbal subscore is 5. GCS motor subscore is 6.  5+ strength in UE and LE with f/e at major joints. Sensation to palpation intact in UE and LE. CNs 2-12 grossly intact.  EOMFI.  PERRL.   Finger nose and  coordination intact bilateral.   No nystagmus Patient unable to do tandem gait. Cautious gait however no focal deficits.   Skin: Skin is warm. No rash noted.  Psychiatric: He has a normal mood and affect.  Nursing note and vitals reviewed.   ED Course  Procedures (including critical care time) Labs Review Labs Reviewed  CBC WITH DIFFERENTIAL/PLATELET - Abnormal; Notable for the following:    RBC 4.15 (*)    Hemoglobin 11.6 (*)    HCT 36.2 (*)    All other components within normal limits  BASIC METABOLIC PANEL - Abnormal; Notable for the following:    Glucose, Bld 159 (*)    All other components within normal limits  URINALYSIS, ROUTINE W REFLEX MICROSCOPIC (NOT AT [facility]) - Abnormal; Notable for the following:    Specific Gravity, Urine <1.005 (*)    All other components within normal limits  CBG MONITORING, ED - Abnormal; Notable for the following:    Glucose-Capillary 140 (*)    All other components within normal limits  TROPONIN I    Imaging Review Ct Head Wo Contrast  02/07/2015  CLINICAL DATA:  Altered mental status, confused.  Dehydrated. EXAM: CT HEAD WITHOUT CONTRAST TECHNIQUE: Contiguous axial images were obtained from the base of the skull through the vertex without intravenous contrast. COMPARISON:  MR brain 12/09/2014 FINDINGS: There is no evidence of mass effect, midline shift, or extra-axial fluid collections. There is no evidence of a space-occupying lesion or intracranial hemorrhage. There is no evidence of a cortical-based area of acute infarction. There is generalized cerebral atrophy. There is periventricular white matter low attenuation likely secondary to microangiopathy. The ventricles and sulci are appropriate for the patient's age. The basal cisterns are patent. Visualized portions of the orbits are unremarkable. The visualized portions of the paranasal sinuses and mastoid air cells are unremarkable. Cerebrovascular atherosclerotic calcifications are noted. The  osseous structures are unremarkable. IMPRESSION: 1. No acute intracranial pathology. 2. Chronic microvascular disease and cerebral atrophy. Electronically Signed   By: Kathreen Devoid   On: 02/07/2015 17:50   I have personally reviewed and evaluated these images and lab results as part of my medical decision-making.   EKG Interpretation   Date/Time:  Saturday February 07 2015 16:08:37 EDT Ventricular Rate:  46 PR Interval:  175 QRS Duration: 92 QT Interval:  457 QTC Calculation: 400 R Axis:   77 Text Interpretation:  Sinus bradycardia Confirmed by Rania Prothero  MD, Skyah Hannon  ([id number]) on 02/07/2015 6:05:08 PM      MDM   Final diagnoses:  Transient alteration of awareness  Dizziness   Patient presents with altered mental status, resolved upon arrival. Plan for metabolic workup in addition with CT scan and urinalysis. IV fluid bolus ordered. Discussed likely plan for close outpatient follow-up with his primary doctor as patient has had this multiple times in the past.  On reassessment patient at baseline. Screening blood work, EKG and CT head reviewed no acute findings. Patient has close follow up outpatient. Wife and patient comfortable with discharge and will return for recurrent symptoms. Patient bradycardic however is not on any medications that would cause this. Results and differential diagnosis  were discussed with the patient/parent/guardian. Xrays were independently reviewed by myself.  Close follow up outpatient was discussed, comfortable with the plan.   Medications  sodium chloride 0.9 % bolus 1,000 mL (0 mLs Intravenous Stopped 02/07/15 1806)    Filed Vitals:   02/07/15 1551 02/07/15 1630 02/07/15 1704 02/07/15 1805  BP: 137/69 149/61 145/65 154/60  Pulse: 51 48 46 50  Temp: 97.7 F (36.5 C)     Resp: 16  22 20   Height: 5\' 11"  (1.803 m)     Weight: 174 lb (78.926 kg)     SpO2: 98% 98% 97% 100%    Final diagnoses:  Transient alteration of awareness  Dizziness        Elnora Morrison, MD 02/07/15 1825

## 2015-05-18 DIAGNOSIS — E119 Type 2 diabetes mellitus without complications: Secondary | ICD-10-CM | POA: Diagnosis not present

## 2015-05-18 DIAGNOSIS — D510 Vitamin B12 deficiency anemia due to intrinsic factor deficiency: Secondary | ICD-10-CM | POA: Diagnosis not present

## 2015-05-18 DIAGNOSIS — Z6825 Body mass index (BMI) 25.0-25.9, adult: Secondary | ICD-10-CM | POA: Diagnosis not present

## 2015-06-03 DIAGNOSIS — E538 Deficiency of other specified B group vitamins: Secondary | ICD-10-CM | POA: Diagnosis not present

## 2015-06-24 DIAGNOSIS — E119 Type 2 diabetes mellitus without complications: Secondary | ICD-10-CM | POA: Diagnosis not present

## 2015-07-06 DIAGNOSIS — E538 Deficiency of other specified B group vitamins: Secondary | ICD-10-CM | POA: Diagnosis not present

## 2015-08-10 DIAGNOSIS — E785 Hyperlipidemia, unspecified: Secondary | ICD-10-CM | POA: Diagnosis not present

## 2015-08-10 DIAGNOSIS — D519 Vitamin B12 deficiency anemia, unspecified: Secondary | ICD-10-CM | POA: Diagnosis not present

## 2015-08-10 DIAGNOSIS — E119 Type 2 diabetes mellitus without complications: Secondary | ICD-10-CM | POA: Diagnosis not present

## 2015-08-10 DIAGNOSIS — D649 Anemia, unspecified: Secondary | ICD-10-CM | POA: Diagnosis not present

## 2015-08-15 DIAGNOSIS — E119 Type 2 diabetes mellitus without complications: Secondary | ICD-10-CM | POA: Diagnosis not present

## 2015-08-17 DIAGNOSIS — E538 Deficiency of other specified B group vitamins: Secondary | ICD-10-CM | POA: Diagnosis not present

## 2015-08-18 DIAGNOSIS — E119 Type 2 diabetes mellitus without complications: Secondary | ICD-10-CM | POA: Diagnosis not present

## 2015-08-18 DIAGNOSIS — R972 Elevated prostate specific antigen [PSA]: Secondary | ICD-10-CM | POA: Diagnosis not present

## 2015-08-18 DIAGNOSIS — E538 Deficiency of other specified B group vitamins: Secondary | ICD-10-CM | POA: Diagnosis not present

## 2015-09-08 DIAGNOSIS — E538 Deficiency of other specified B group vitamins: Secondary | ICD-10-CM | POA: Diagnosis not present

## 2015-09-09 DIAGNOSIS — E113212 Type 2 diabetes mellitus with mild nonproliferative diabetic retinopathy with macular edema, left eye: Secondary | ICD-10-CM | POA: Diagnosis not present

## 2015-09-09 DIAGNOSIS — H47293 Other optic atrophy, bilateral: Secondary | ICD-10-CM | POA: Diagnosis not present

## 2015-09-09 DIAGNOSIS — H26492 Other secondary cataract, left eye: Secondary | ICD-10-CM | POA: Diagnosis not present

## 2015-09-18 DIAGNOSIS — E538 Deficiency of other specified B group vitamins: Secondary | ICD-10-CM | POA: Diagnosis not present

## 2015-09-29 ENCOUNTER — Other Ambulatory Visit: Payer: Self-pay | Admitting: Dermatology

## 2015-09-29 DIAGNOSIS — D239 Other benign neoplasm of skin, unspecified: Secondary | ICD-10-CM | POA: Diagnosis not present

## 2015-09-29 DIAGNOSIS — L821 Other seborrheic keratosis: Secondary | ICD-10-CM | POA: Diagnosis not present

## 2015-09-29 DIAGNOSIS — D485 Neoplasm of uncertain behavior of skin: Secondary | ICD-10-CM | POA: Diagnosis not present

## 2015-09-29 DIAGNOSIS — L820 Inflamed seborrheic keratosis: Secondary | ICD-10-CM | POA: Diagnosis not present

## 2015-10-07 DIAGNOSIS — E119 Type 2 diabetes mellitus without complications: Secondary | ICD-10-CM | POA: Diagnosis not present

## 2015-10-19 ENCOUNTER — Encounter (HOSPITAL_COMMUNITY): Payer: Self-pay | Admitting: *Deleted

## 2015-10-19 ENCOUNTER — Encounter (HOSPITAL_COMMUNITY)
Admission: RE | Disposition: A | Discharge status: Home / Self Care | Payer: Self-pay | Source: Ambulatory Visit | Attending: Ophthalmology

## 2015-10-19 ENCOUNTER — Ambulatory Visit (HOSPITAL_COMMUNITY)
Admission: RE | Admit: 2015-10-19 | Discharge: 2015-10-19 | Disposition: A | Discharge status: Home / Self Care | Payer: Medicare Other | Source: Ambulatory Visit | Attending: Ophthalmology | Admitting: Ophthalmology

## 2015-10-19 DIAGNOSIS — Z7984 Long term (current) use of oral hypoglycemic drugs: Secondary | ICD-10-CM | POA: Diagnosis not present

## 2015-10-19 DIAGNOSIS — H1012 Acute atopic conjunctivitis, left eye: Secondary | ICD-10-CM | POA: Diagnosis not present

## 2015-10-19 DIAGNOSIS — H538 Other visual disturbances: Secondary | ICD-10-CM | POA: Diagnosis not present

## 2015-10-19 DIAGNOSIS — H5200 Hypermetropia, unspecified eye: Secondary | ICD-10-CM | POA: Diagnosis not present

## 2015-10-19 DIAGNOSIS — H52209 Unspecified astigmatism, unspecified eye: Secondary | ICD-10-CM | POA: Insufficient documentation

## 2015-10-19 DIAGNOSIS — E113212 Type 2 diabetes mellitus with mild nonproliferative diabetic retinopathy with macular edema, left eye: Secondary | ICD-10-CM | POA: Insufficient documentation

## 2015-10-19 DIAGNOSIS — H5210 Myopia, unspecified eye: Secondary | ICD-10-CM | POA: Diagnosis not present

## 2015-10-19 DIAGNOSIS — Z7982 Long term (current) use of aspirin: Secondary | ICD-10-CM | POA: Insufficient documentation

## 2015-10-19 DIAGNOSIS — E7800 Pure hypercholesterolemia, unspecified: Secondary | ICD-10-CM | POA: Insufficient documentation

## 2015-10-19 DIAGNOSIS — E538 Deficiency of other specified B group vitamins: Secondary | ICD-10-CM | POA: Diagnosis not present

## 2015-10-19 DIAGNOSIS — H2640 Unspecified secondary cataract: Secondary | ICD-10-CM | POA: Diagnosis not present

## 2015-10-19 DIAGNOSIS — Z79899 Other long term (current) drug therapy: Secondary | ICD-10-CM | POA: Insufficient documentation

## 2015-10-19 DIAGNOSIS — H26492 Other secondary cataract, left eye: Secondary | ICD-10-CM | POA: Diagnosis not present

## 2015-10-19 HISTORY — PX: YAG LASER APPLICATION: SHX6189

## 2015-10-19 SURGERY — TREATMENT, USING YAG LASER
Anesthesia: LOCAL | Laterality: Left

## 2015-10-19 MED ORDER — CYCLOPENTOLATE-PHENYLEPHRINE OP SOLN OPTIME - NO CHARGE
OPHTHALMIC | Status: AC
  Filled 2015-10-19: qty 2

## 2015-10-19 MED ORDER — TETRACAINE HCL 0.5 % OP SOLN
1.0000 [drp] | Freq: Once | OPHTHALMIC | Status: AC
  Administered 2015-10-19: 1 [drp] via OPHTHALMIC

## 2015-10-19 MED ORDER — TETRACAINE HCL 0.5 % OP SOLN
OPHTHALMIC | Status: AC
  Filled 2015-10-19: qty 4

## 2015-10-19 MED ORDER — CYCLOPENTOLATE-PHENYLEPHRINE 0.2-1 % OP SOLN
1.0000 [drp] | OPHTHALMIC | Status: AC
  Administered 2015-10-19 (×3): 1 [drp] via OPHTHALMIC

## 2015-10-19 NOTE — H&P (Signed)
I have reviewed the pre printed H&P, the patient was re-examined, and I have identified no significant interval changes in the patient's medical condition.  There is no change in the plan of care since the history and physical of record. 

## 2015-10-19 NOTE — Brief Op Note (Signed)
Eric Brady 10/19/2015  Williams Che, MD  Pre-op Diagnosis:  secondary cataract left eye  Post-op Diagnosis:   same  Yag laser self-test completed: Yes.    Indications:  See scanned office H&P for details  Procedure:  YAG posterior capsulotomy OS  Eye protection worn by staff:  Yes.   Laser In Use sign on door:  Yes.    Laser:  {LUMENIS YAG SLT  Selecta duet  Power Setting:  1.7 mJ/burst Anatomical site treated:  Posterior capsule Number of applications:  50 Total energy delivered: 81.23 mJ Results:  Clear visual axis  Patient was instructed to go to the office, as previously scheduled, for intraocular pressure:  No.  Patient verbalizes understanding of discharge instructions:  Yes.    Notes:   Patient tolerated procedure well without complications

## 2015-10-19 NOTE — Discharge Instructions (Signed)
Eric Brady  10/19/2015     Instructions    Activity: No Restrictions.   Diet: Resume Diet you were on at home.   Pain Medication: Tylenol if Needed.   CONTACT YOUR DOCTOR IF YOU HAVE PAIN, REDNESS IN YOUR EYE, OR DECREASED VISION.   Follow-up:{follow up:32580} with Williams Che, MD.   Dr. Gershon Crane: [phone]  Dr. Iona HansenJI:[id number]  Dr. Geoffry ParadiseID:[id number]   If you find that you cannot contact your physician, but feel that your signs and   Symptoms warrant a physician's attention, call the Emergency Room at   [phone] ext.[phone].   Othern/a.  Followup Appointment 11-10-2015 at 8:15 am with Dr. Iona Hansen

## 2015-10-20 ENCOUNTER — Encounter (HOSPITAL_COMMUNITY): Payer: Self-pay | Admitting: Ophthalmology

## 2015-10-26 DIAGNOSIS — E113212 Type 2 diabetes mellitus with mild nonproliferative diabetic retinopathy with macular edema, left eye: Secondary | ICD-10-CM | POA: Diagnosis not present

## 2015-11-26 DIAGNOSIS — E119 Type 2 diabetes mellitus without complications: Secondary | ICD-10-CM | POA: Diagnosis not present

## 2015-11-30 DIAGNOSIS — E538 Deficiency of other specified B group vitamins: Secondary | ICD-10-CM | POA: Diagnosis not present

## 2015-12-28 ENCOUNTER — Telehealth: Payer: Self-pay

## 2015-12-28 NOTE — Telephone Encounter (Signed)
Error. Wrong patient.

## 2015-12-28 NOTE — Telephone Encounter (Signed)
Pt called to speak with DS about being triaged for her colonoscopy. She said she is going to [place] to check on her car, but if DS could call her before she leaves today or either tomorrow. [phone]

## 2016-01-01 DIAGNOSIS — E538 Deficiency of other specified B group vitamins: Secondary | ICD-10-CM | POA: Diagnosis not present

## 2016-01-13 DIAGNOSIS — E119 Type 2 diabetes mellitus without complications: Secondary | ICD-10-CM | POA: Diagnosis not present

## 2016-01-21 DIAGNOSIS — Z23 Encounter for immunization: Secondary | ICD-10-CM | POA: Diagnosis not present

## 2016-02-08 DIAGNOSIS — E538 Deficiency of other specified B group vitamins: Secondary | ICD-10-CM | POA: Diagnosis not present

## 2016-02-12 DIAGNOSIS — E119 Type 2 diabetes mellitus without complications: Secondary | ICD-10-CM | POA: Diagnosis not present

## 2016-02-12 DIAGNOSIS — E785 Hyperlipidemia, unspecified: Secondary | ICD-10-CM | POA: Diagnosis not present

## 2016-02-12 DIAGNOSIS — Z79899 Other long term (current) drug therapy: Secondary | ICD-10-CM | POA: Diagnosis not present

## 2016-02-12 DIAGNOSIS — Z125 Encounter for screening for malignant neoplasm of prostate: Secondary | ICD-10-CM | POA: Diagnosis not present

## 2016-02-12 DIAGNOSIS — D649 Anemia, unspecified: Secondary | ICD-10-CM | POA: Diagnosis not present

## 2016-02-19 DIAGNOSIS — R972 Elevated prostate specific antigen [PSA]: Secondary | ICD-10-CM | POA: Diagnosis not present

## 2016-02-19 DIAGNOSIS — E119 Type 2 diabetes mellitus without complications: Secondary | ICD-10-CM | POA: Diagnosis not present

## 2016-03-07 DIAGNOSIS — E119 Type 2 diabetes mellitus without complications: Secondary | ICD-10-CM | POA: Diagnosis not present

## 2016-03-10 DIAGNOSIS — E538 Deficiency of other specified B group vitamins: Secondary | ICD-10-CM | POA: Diagnosis not present

## 2016-04-11 DIAGNOSIS — E538 Deficiency of other specified B group vitamins: Secondary | ICD-10-CM | POA: Diagnosis not present

## 2016-04-15 DIAGNOSIS — Z8042 Family history of malignant neoplasm of prostate: Secondary | ICD-10-CM | POA: Insufficient documentation

## 2016-04-15 DIAGNOSIS — N401 Enlarged prostate with lower urinary tract symptoms: Secondary | ICD-10-CM | POA: Diagnosis not present

## 2016-04-15 DIAGNOSIS — N138 Other obstructive and reflux uropathy: Secondary | ICD-10-CM | POA: Diagnosis not present

## 2016-04-15 DIAGNOSIS — R972 Elevated prostate specific antigen [PSA]: Secondary | ICD-10-CM | POA: Diagnosis not present

## 2016-04-26 DIAGNOSIS — E119 Type 2 diabetes mellitus without complications: Secondary | ICD-10-CM | POA: Diagnosis not present

## 2016-05-09 DIAGNOSIS — R972 Elevated prostate specific antigen [PSA]: Secondary | ICD-10-CM | POA: Diagnosis not present

## 2016-05-13 DIAGNOSIS — E538 Deficiency of other specified B group vitamins: Secondary | ICD-10-CM | POA: Diagnosis not present

## 2016-06-13 DIAGNOSIS — E538 Deficiency of other specified B group vitamins: Secondary | ICD-10-CM | POA: Diagnosis not present

## 2016-06-15 DIAGNOSIS — E119 Type 2 diabetes mellitus without complications: Secondary | ICD-10-CM | POA: Diagnosis not present

## 2016-06-21 DIAGNOSIS — E119 Type 2 diabetes mellitus without complications: Secondary | ICD-10-CM | POA: Diagnosis not present

## 2016-06-23 DIAGNOSIS — Z6826 Body mass index (BMI) 26.0-26.9, adult: Secondary | ICD-10-CM | POA: Diagnosis not present

## 2016-06-23 DIAGNOSIS — E1139 Type 2 diabetes mellitus with other diabetic ophthalmic complication: Secondary | ICD-10-CM | POA: Diagnosis not present

## 2016-06-23 DIAGNOSIS — R972 Elevated prostate specific antigen [PSA]: Secondary | ICD-10-CM | POA: Diagnosis not present

## 2016-07-11 DIAGNOSIS — E539 Vitamin B deficiency, unspecified: Secondary | ICD-10-CM | POA: Diagnosis not present

## 2016-07-18 DIAGNOSIS — N401 Enlarged prostate with lower urinary tract symptoms: Secondary | ICD-10-CM | POA: Diagnosis not present

## 2016-07-18 DIAGNOSIS — R972 Elevated prostate specific antigen [PSA]: Secondary | ICD-10-CM | POA: Diagnosis not present

## 2016-07-18 DIAGNOSIS — Z8042 Family history of malignant neoplasm of prostate: Secondary | ICD-10-CM | POA: Diagnosis not present

## 2016-07-18 DIAGNOSIS — N138 Other obstructive and reflux uropathy: Secondary | ICD-10-CM | POA: Diagnosis not present

## 2016-08-10 DIAGNOSIS — E119 Type 2 diabetes mellitus without complications: Secondary | ICD-10-CM | POA: Diagnosis not present

## 2016-08-12 DIAGNOSIS — E538 Deficiency of other specified B group vitamins: Secondary | ICD-10-CM | POA: Diagnosis not present

## 2016-08-22 DIAGNOSIS — L570 Actinic keratosis: Secondary | ICD-10-CM | POA: Diagnosis not present

## 2016-08-22 DIAGNOSIS — D229 Melanocytic nevi, unspecified: Secondary | ICD-10-CM | POA: Diagnosis not present

## 2016-08-22 DIAGNOSIS — L821 Other seborrheic keratosis: Secondary | ICD-10-CM | POA: Diagnosis not present

## 2016-09-28 DIAGNOSIS — E119 Type 2 diabetes mellitus without complications: Secondary | ICD-10-CM | POA: Diagnosis not present

## 2016-10-04 DIAGNOSIS — E538 Deficiency of other specified B group vitamins: Secondary | ICD-10-CM | POA: Diagnosis not present

## 2016-10-20 DIAGNOSIS — D508 Other iron deficiency anemias: Secondary | ICD-10-CM | POA: Diagnosis not present

## 2016-10-20 DIAGNOSIS — Z79899 Other long term (current) drug therapy: Secondary | ICD-10-CM | POA: Diagnosis not present

## 2016-10-20 DIAGNOSIS — E119 Type 2 diabetes mellitus without complications: Secondary | ICD-10-CM | POA: Diagnosis not present

## 2016-10-20 DIAGNOSIS — E785 Hyperlipidemia, unspecified: Secondary | ICD-10-CM | POA: Diagnosis not present

## 2016-10-27 DIAGNOSIS — E538 Deficiency of other specified B group vitamins: Secondary | ICD-10-CM | POA: Diagnosis not present

## 2016-10-27 DIAGNOSIS — E119 Type 2 diabetes mellitus without complications: Secondary | ICD-10-CM | POA: Diagnosis not present

## 2016-11-08 DIAGNOSIS — E538 Deficiency of other specified B group vitamins: Secondary | ICD-10-CM | POA: Diagnosis not present

## 2016-11-17 DIAGNOSIS — E119 Type 2 diabetes mellitus without complications: Secondary | ICD-10-CM | POA: Diagnosis not present

## 2016-11-18 DIAGNOSIS — N401 Enlarged prostate with lower urinary tract symptoms: Secondary | ICD-10-CM | POA: Diagnosis not present

## 2016-11-18 DIAGNOSIS — Z8042 Family history of malignant neoplasm of prostate: Secondary | ICD-10-CM | POA: Diagnosis not present

## 2016-11-18 DIAGNOSIS — R972 Elevated prostate specific antigen [PSA]: Secondary | ICD-10-CM | POA: Diagnosis not present

## 2016-11-18 DIAGNOSIS — N138 Other obstructive and reflux uropathy: Secondary | ICD-10-CM | POA: Diagnosis not present

## 2016-12-06 DIAGNOSIS — E538 Deficiency of other specified B group vitamins: Secondary | ICD-10-CM | POA: Diagnosis not present

## 2016-12-22 DIAGNOSIS — H8149 Vertigo of central origin, unspecified ear: Secondary | ICD-10-CM | POA: Diagnosis not present

## 2016-12-22 DIAGNOSIS — R42 Dizziness and giddiness: Secondary | ICD-10-CM | POA: Diagnosis not present

## 2016-12-22 DIAGNOSIS — R51 Headache: Secondary | ICD-10-CM | POA: Diagnosis not present

## 2017-01-05 DIAGNOSIS — E119 Type 2 diabetes mellitus without complications: Secondary | ICD-10-CM | POA: Diagnosis not present

## 2017-01-10 DIAGNOSIS — Z23 Encounter for immunization: Secondary | ICD-10-CM | POA: Diagnosis not present

## 2017-01-10 DIAGNOSIS — E538 Deficiency of other specified B group vitamins: Secondary | ICD-10-CM | POA: Diagnosis not present

## 2017-02-20 DIAGNOSIS — E785 Hyperlipidemia, unspecified: Secondary | ICD-10-CM | POA: Diagnosis not present

## 2017-02-20 DIAGNOSIS — E119 Type 2 diabetes mellitus without complications: Secondary | ICD-10-CM | POA: Diagnosis not present

## 2017-02-20 DIAGNOSIS — E538 Deficiency of other specified B group vitamins: Secondary | ICD-10-CM | POA: Diagnosis not present

## 2017-02-21 DIAGNOSIS — E538 Deficiency of other specified B group vitamins: Secondary | ICD-10-CM | POA: Diagnosis not present

## 2017-02-27 DIAGNOSIS — E1122 Type 2 diabetes mellitus with diabetic chronic kidney disease: Secondary | ICD-10-CM | POA: Diagnosis not present

## 2017-02-27 DIAGNOSIS — D519 Vitamin B12 deficiency anemia, unspecified: Secondary | ICD-10-CM | POA: Diagnosis not present

## 2017-02-27 DIAGNOSIS — E119 Type 2 diabetes mellitus without complications: Secondary | ICD-10-CM | POA: Diagnosis not present

## 2017-02-27 DIAGNOSIS — N183 Chronic kidney disease, stage 3 (moderate): Secondary | ICD-10-CM | POA: Diagnosis not present

## 2017-03-21 DIAGNOSIS — E538 Deficiency of other specified B group vitamins: Secondary | ICD-10-CM | POA: Diagnosis not present

## 2017-04-21 DIAGNOSIS — E119 Type 2 diabetes mellitus without complications: Secondary | ICD-10-CM | POA: Diagnosis not present

## 2017-04-25 DIAGNOSIS — E538 Deficiency of other specified B group vitamins: Secondary | ICD-10-CM | POA: Diagnosis not present

## 2017-05-26 DIAGNOSIS — Z8042 Family history of malignant neoplasm of prostate: Secondary | ICD-10-CM | POA: Diagnosis not present

## 2017-05-26 DIAGNOSIS — N401 Enlarged prostate with lower urinary tract symptoms: Secondary | ICD-10-CM | POA: Diagnosis not present

## 2017-05-26 DIAGNOSIS — R972 Elevated prostate specific antigen [PSA]: Secondary | ICD-10-CM | POA: Diagnosis not present

## 2017-05-26 DIAGNOSIS — N138 Other obstructive and reflux uropathy: Secondary | ICD-10-CM | POA: Diagnosis not present

## 2017-05-30 DIAGNOSIS — E538 Deficiency of other specified B group vitamins: Secondary | ICD-10-CM | POA: Diagnosis not present

## 2017-06-15 DIAGNOSIS — E119 Type 2 diabetes mellitus without complications: Secondary | ICD-10-CM | POA: Diagnosis not present

## 2017-06-19 DIAGNOSIS — E785 Hyperlipidemia, unspecified: Secondary | ICD-10-CM | POA: Diagnosis not present

## 2017-06-19 DIAGNOSIS — E119 Type 2 diabetes mellitus without complications: Secondary | ICD-10-CM | POA: Diagnosis not present

## 2017-06-19 DIAGNOSIS — N183 Chronic kidney disease, stage 3 (moderate): Secondary | ICD-10-CM | POA: Diagnosis not present

## 2017-06-19 DIAGNOSIS — Z79899 Other long term (current) drug therapy: Secondary | ICD-10-CM | POA: Diagnosis not present

## 2017-06-26 DIAGNOSIS — Z6825 Body mass index (BMI) 25.0-25.9, adult: Secondary | ICD-10-CM | POA: Diagnosis not present

## 2017-06-26 DIAGNOSIS — E119 Type 2 diabetes mellitus without complications: Secondary | ICD-10-CM | POA: Diagnosis not present

## 2017-06-26 DIAGNOSIS — N183 Chronic kidney disease, stage 3 (moderate): Secondary | ICD-10-CM | POA: Diagnosis not present

## 2017-06-28 ENCOUNTER — Other Ambulatory Visit: Payer: Self-pay | Admitting: Dermatology

## 2017-06-28 DIAGNOSIS — D485 Neoplasm of uncertain behavior of skin: Secondary | ICD-10-CM | POA: Diagnosis not present

## 2017-06-28 DIAGNOSIS — D229 Melanocytic nevi, unspecified: Secondary | ICD-10-CM | POA: Diagnosis not present

## 2017-06-28 DIAGNOSIS — L821 Other seborrheic keratosis: Secondary | ICD-10-CM | POA: Diagnosis not present

## 2017-06-28 DIAGNOSIS — L820 Inflamed seborrheic keratosis: Secondary | ICD-10-CM | POA: Diagnosis not present

## 2017-06-30 DIAGNOSIS — E539 Vitamin B deficiency, unspecified: Secondary | ICD-10-CM | POA: Diagnosis not present

## 2017-08-07 DIAGNOSIS — E539 Vitamin B deficiency, unspecified: Secondary | ICD-10-CM | POA: Diagnosis not present

## 2017-08-10 DIAGNOSIS — E119 Type 2 diabetes mellitus without complications: Secondary | ICD-10-CM | POA: Diagnosis not present

## 2017-08-25 DIAGNOSIS — Z961 Presence of intraocular lens: Secondary | ICD-10-CM | POA: Diagnosis not present

## 2017-08-25 DIAGNOSIS — E119 Type 2 diabetes mellitus without complications: Secondary | ICD-10-CM | POA: Diagnosis not present

## 2017-09-11 DIAGNOSIS — E539 Vitamin B deficiency, unspecified: Secondary | ICD-10-CM | POA: Diagnosis not present

## 2017-10-03 DIAGNOSIS — E119 Type 2 diabetes mellitus without complications: Secondary | ICD-10-CM | POA: Diagnosis not present

## 2017-10-09 DIAGNOSIS — Z6825 Body mass index (BMI) 25.0-25.9, adult: Secondary | ICD-10-CM | POA: Diagnosis not present

## 2017-10-09 DIAGNOSIS — E119 Type 2 diabetes mellitus without complications: Secondary | ICD-10-CM | POA: Diagnosis not present

## 2017-10-09 DIAGNOSIS — I700 Atherosclerosis of aorta: Secondary | ICD-10-CM | POA: Diagnosis not present

## 2017-10-09 DIAGNOSIS — K921 Melena: Secondary | ICD-10-CM | POA: Diagnosis not present

## 2017-10-16 DIAGNOSIS — E538 Deficiency of other specified B group vitamins: Secondary | ICD-10-CM | POA: Diagnosis not present

## 2017-11-20 DIAGNOSIS — E538 Deficiency of other specified B group vitamins: Secondary | ICD-10-CM | POA: Diagnosis not present

## 2017-11-24 DIAGNOSIS — R972 Elevated prostate specific antigen [PSA]: Secondary | ICD-10-CM | POA: Diagnosis not present

## 2017-11-24 DIAGNOSIS — Z8042 Family history of malignant neoplasm of prostate: Secondary | ICD-10-CM | POA: Diagnosis not present

## 2017-11-24 DIAGNOSIS — N401 Enlarged prostate with lower urinary tract symptoms: Secondary | ICD-10-CM | POA: Diagnosis not present

## 2017-11-24 DIAGNOSIS — N138 Other obstructive and reflux uropathy: Secondary | ICD-10-CM | POA: Diagnosis not present

## 2017-12-15 DIAGNOSIS — R972 Elevated prostate specific antigen [PSA]: Secondary | ICD-10-CM | POA: Diagnosis not present

## 2017-12-25 DIAGNOSIS — Z23 Encounter for immunization: Secondary | ICD-10-CM | POA: Diagnosis not present

## 2017-12-25 DIAGNOSIS — E538 Deficiency of other specified B group vitamins: Secondary | ICD-10-CM | POA: Diagnosis not present

## 2018-01-10 DIAGNOSIS — E119 Type 2 diabetes mellitus without complications: Secondary | ICD-10-CM | POA: Diagnosis not present

## 2018-02-02 DIAGNOSIS — E538 Deficiency of other specified B group vitamins: Secondary | ICD-10-CM | POA: Diagnosis not present

## 2018-02-05 DIAGNOSIS — D649 Anemia, unspecified: Secondary | ICD-10-CM | POA: Diagnosis not present

## 2018-02-05 DIAGNOSIS — Z79899 Other long term (current) drug therapy: Secondary | ICD-10-CM | POA: Diagnosis not present

## 2018-02-05 DIAGNOSIS — E119 Type 2 diabetes mellitus without complications: Secondary | ICD-10-CM | POA: Diagnosis not present

## 2018-02-05 DIAGNOSIS — D519 Vitamin B12 deficiency anemia, unspecified: Secondary | ICD-10-CM | POA: Diagnosis not present

## 2018-02-12 DIAGNOSIS — N183 Chronic kidney disease, stage 3 (moderate): Secondary | ICD-10-CM | POA: Diagnosis not present

## 2018-02-12 DIAGNOSIS — R079 Chest pain, unspecified: Secondary | ICD-10-CM | POA: Diagnosis not present

## 2018-02-12 DIAGNOSIS — D510 Vitamin B12 deficiency anemia due to intrinsic factor deficiency: Secondary | ICD-10-CM | POA: Diagnosis not present

## 2018-02-12 DIAGNOSIS — E1122 Type 2 diabetes mellitus with diabetic chronic kidney disease: Secondary | ICD-10-CM | POA: Diagnosis not present

## 2018-02-15 ENCOUNTER — Encounter: Payer: Self-pay | Admitting: Cardiology

## 2018-02-15 ENCOUNTER — Ambulatory Visit: Payer: Medicare Other | Admitting: Cardiology

## 2018-02-15 VITALS — BP 130/78 | HR 56 | Ht 71.0 in | Wt 179.8 lb

## 2018-02-15 DIAGNOSIS — R42 Dizziness and giddiness: Secondary | ICD-10-CM

## 2018-02-15 DIAGNOSIS — R011 Cardiac murmur, unspecified: Secondary | ICD-10-CM | POA: Diagnosis not present

## 2018-02-15 NOTE — Progress Notes (Signed)
Clinical Summary Mr. Thau is a 84 y.o.male seen as new consult, referred by Dr Willey Blade for dizziness.  1. Dizzy spells - x 1 month. Mainly with bending or standing - no true syncope - feels off balance, no palpitaitons.  - latsts just a few seconds, then resolves   - small bottles of water x 2, coffee x 2 cups, diet coke/pepsi bottles x 2, occasoinal tea, no energy drinks, no EtoH  Past Medical History:  Diagnosis Date  . Anemia   . Diabetes mellitus without complication ([place])   . Hyperlipidemia      No Known Allergies   Current Outpatient Medications  Medication Sig Dispense Refill  . aspirin 81 MG tablet Take 81 mg by mouth daily.    Marland Kitchen atorvastatin (LIPITOR) 10 MG tablet Take 10 mg by mouth daily.    . cyanocobalamin (,VITAMIN B-12,) 1000 MCG/ML injection Inject 1,000 mcg into the muscle every 30 (thirty) days.    . metFORMIN (GLUCOPHAGE-XR) 500 MG 24 hr tablet Take 500 mg by mouth daily.  11   No current facility-administered medications for this visit.      Past Surgical History:  Procedure Laterality Date  . COLONOSCOPY N/A 09/12/2012   Procedure: COLONOSCOPY;  Surgeon: Rogene Houston, MD;  [facility];  Service: Endoscopy;  Laterality: N/A;  915  . EYE SURGERY    . ORIF ANKLE FRACTURE Right   . YAG LASER APPLICATION Left 2/35/3614   Procedure: YAG LASER APPLICATION;  Surgeon: Williams Che, MD;  Location: [facility];  Service: Ophthalmology;  Laterality: Left;     No Known Allergies    No family history on file.   Social History Mr. Buffin reports that he has quit smoking. He does not have any smokeless tobacco history on file. Mr. Roehrig reports that he does not drink alcohol.   Review of Systems CONSTITUTIONAL: No weight loss, fever, chills, weakness or fatigue.  HEENT: Eyes: No visual loss, blurred vision, double vision or yellow sclerae.No hearing loss, sneezing, congestion, runny nose or sore throat.  SKIN: No rash or  itching.  CARDIOVASCULAR: no chest pain , no palpitations.  RESPIRATORY: No shortness of breath, cough or sputum.  GASTROINTESTINAL: No anorexia, nausea, vomiting or diarrhea. No abdominal pain or blood.  GENITOURINARY: No burning on urination, no polyuria NEUROLOGICAL: +dizziness MUSCULOSKELETAL: No muscle, back pain, joint pain or stiffness.  LYMPHATICS: No enlarged nodes. No history of splenectomy.  PSYCHIATRIC: No history of depression or anxiety.  ENDOCRINOLOGIC: No reports of sweating, cold or heat intolerance. No polyuria or polydipsia.  Marland Kitchen   Physical Examination Vitals:   02/15/18 1037  BP: 130/78  Pulse: (!) 56  SpO2: 96%   Vitals:   02/15/18 1037  Weight: 179 lb 12.8 oz (81.6 kg)  Height: 5\' 11"  (1.803 m)    Gen: resting comfortably, no acute distress HEENT: no scleral icterus, pupils equal round and reactive, no palptable cervical adenopathy,  CV: RRR, 2/6 systolic murmur rusb, no jvd Resp: Clear to auscultation bilaterally GI: abdomen is soft, non-tender, non-distended, normal bowel sounds, no hepatosplenomegaly MSK: extremities are warm, no edema.  Skin: warm, no rash Neuro:  no focal deficits Psych: appropriate affect      Assessment and Plan  1. Dizziness/Orhotstatic dizziness - severely orthostatic in clinic, SBP drops 43 points with standing. DBP drops 23 points - poor oral hydration. Counseled on aggressive hydration, we will monitor if symptoms. If persistent symptoms with hydration may require starting  either midodrine or florinef.   2. Heart murmur - obtain echo  3. Bradycardia Mild sinus brady, chronic for several years. Not related to his recent dizziness  F/u 3 months   [name], M.D.

## 2018-02-15 NOTE — Patient Instructions (Addendum)
Medication Instructions:  None If you need a refill on your cardiac medications before your next appointment, please call your pharmacy.   Lab work: None If you have labs (blood work) drawn today and your tests are completely normal, you will receive your results only by: Marland Kitchen MyChart Message (if you have MyChart) OR . A paper copy in the mail If you have any lab test that is abnormal or we need to change your treatment, we will call you to review the results.  Testing/Procedures: Your physician has requested that you have an echocardiogram. Echocardiography is a painless test that uses sound waves to create images of your heart. It provides your doctor with information about the size and shape of your heart and how well your heart's chambers and valves are working. This procedure takes approximately one hour. There are no restrictions for this procedure.   Follow-Up: Follow up in 3 months with Dr Harl Bowie or our physician assistant   Any Other Special Instructions Will Be Listed Below (If Applicable). None

## 2018-02-16 ENCOUNTER — Telehealth: Payer: Self-pay | Admitting: Cardiology

## 2018-02-16 DIAGNOSIS — R972 Elevated prostate specific antigen [PSA]: Secondary | ICD-10-CM | POA: Diagnosis not present

## 2018-02-16 NOTE — Progress Notes (Signed)
Discussed patient with Dr Willey Blade. From consult note patient was to be seen yesterday for chest pain. I asked both patient and his wife about these symptoms yesterday and they denied any symptoms, reported he was coming in to be evaluated for dizziness and later found to be severely orthostatics. In talking with Dr Willey Blade today he reports his history was pain across both shoulder blades with walking, better with rest. We will reassess these symptoms at his f/u   Zandra Abts MD

## 2018-02-20 ENCOUNTER — Other Ambulatory Visit (HOSPITAL_COMMUNITY): Payer: Medicare Other

## 2018-02-23 ENCOUNTER — Ambulatory Visit (HOSPITAL_COMMUNITY)
Admission: RE | Admit: 2018-02-23 | Discharge: 2018-02-23 | Disposition: A | Discharge status: Home / Self Care | Payer: Medicare Other | Source: Ambulatory Visit | Attending: Cardiology | Admitting: Cardiology

## 2018-02-23 DIAGNOSIS — R011 Cardiac murmur, unspecified: Secondary | ICD-10-CM | POA: Diagnosis not present

## 2018-02-23 NOTE — Progress Notes (Signed)
  Echocardiogram 2D Echocardiogram has been performed.  Darlina Sicilian M 02/23/2018, 10:43 AM

## 2018-02-26 ENCOUNTER — Telehealth: Payer: Self-pay

## 2018-02-26 NOTE — Telephone Encounter (Signed)
Pt made aware. Appt made. Pt aware.

## 2018-02-26 NOTE — Telephone Encounter (Signed)
-----   Message from [name], MD sent at 02/26/2018  8:27 AM EST ----- Echo looks good. One of his heart valve is just mildly thicker than normal which creates a mild heart murmur but overall is working well and not something to worry about. After our visit Dr Willey Blade had called about some concern about some exertional back and shoulder pain the patient has been having and wanted [place] to evaluate. Can I see him back earler than planned, can we do Fri Dec 13 at 12pm  Zandra Abts MD

## 2018-03-04 DIAGNOSIS — E119 Type 2 diabetes mellitus without complications: Secondary | ICD-10-CM | POA: Diagnosis not present

## 2018-03-06 DIAGNOSIS — E538 Deficiency of other specified B group vitamins: Secondary | ICD-10-CM | POA: Diagnosis not present

## 2018-03-16 ENCOUNTER — Ambulatory Visit: Payer: Medicare Other | Admitting: Cardiology

## 2018-03-16 ENCOUNTER — Encounter: Payer: Self-pay | Admitting: Cardiology

## 2018-03-16 VITALS — BP 98/70 | HR 59 | Ht 70.0 in | Wt 182.0 lb

## 2018-03-16 DIAGNOSIS — R0789 Other chest pain: Secondary | ICD-10-CM | POA: Diagnosis not present

## 2018-03-16 DIAGNOSIS — R42 Dizziness and giddiness: Secondary | ICD-10-CM

## 2018-03-16 NOTE — Patient Instructions (Signed)
Medication Instructions:  Your physician recommends that you continue on your current medications as directed. Please refer to the Current Medication list given to you today.  If you need a refill on your cardiac medications before your next appointment, please call your pharmacy.   Lab work: NONE  If you have labs (blood work) drawn today and your tests are completely normal, you will receive your results only by: . MyChart Message (if you have MyChart) OR . A paper copy in the mail If you have any lab test that is abnormal or we need to change your treatment, we will call you to review the results.  Testing/Procedures: NONE   Follow-Up: At [facility], you and your health needs are our priority.  As part of our continuing mission to provide you with exceptional heart care, we have created designated Provider Care Teams.  These Care Teams include your primary Cardiologist (physician) and Advanced Practice Providers (APPs -  Physician Assistants and Nurse Practitioners) who all work together to provide you with the care you need, when you need it. You will need a follow up appointment in 4 months.  Please call our office 2 months in advance to schedule this appointment.  You may see Branch, Jonathan, MD or one of the following Advanced Practice Providers on your designated Care Team:   Brittany Strader, PA-C ([place] Office) . Michele Lenze, PA-C ([place] Office)  Any Other Special Instructions Will Be Listed Below (If Applicable). Thank you for choosing [facility]!     

## 2018-03-16 NOTE — Progress Notes (Signed)
Clinical Summary Mr. Bochicchio is a 84 y.o.male seen today for follow up of the following medical rpoblems.   1. Dizzy spells - last visit found to be severely orthostatic in clinic, SBP dropped 43 points with standing. Overall poor oral hydration - since last visit has increased hydration, symptoms improved significantly but not resolved, overall tolerable.    2. Shoulder pain - previously had shoulder with exertion, bilateral. No other associated symptoms. Better with tylenol. Not positional. Aching pain. Last episode several week ago.  - has completely resolved over the last few weeks  Past Medical History:  Diagnosis Date  . Anemia   . Diabetes mellitus without complication ([place])   . Hyperlipidemia      No Known Allergies   Current Outpatient Medications  Medication Sig Dispense Refill  . aspirin 81 MG tablet Take 81 mg by mouth daily.    Marland Kitchen atorvastatin (LIPITOR) 10 MG tablet Take 10 mg by mouth daily.    . cyanocobalamin (,VITAMIN B-12,) 1000 MCG/ML injection Inject 1,000 mcg into the muscle every 30 (thirty) days.    . metFORMIN (GLUCOPHAGE-XR) 500 MG 24 hr tablet Take 500 mg by mouth daily.  11   No current facility-administered medications for this visit.      Past Surgical History:  Procedure Laterality Date  . COLONOSCOPY N/A 09/12/2012   Procedure: COLONOSCOPY;  Surgeon: Rogene Houston, MD;  [facility];  Service: Endoscopy;  Laterality: N/A;  915  . EYE SURGERY    . ORIF ANKLE FRACTURE Right   . YAG LASER APPLICATION Left 04/04/7508   Procedure: YAG LASER APPLICATION;  Surgeon: Williams Che, MD;  Location: [facility];  Service: Ophthalmology;  Laterality: Left;     No Known Allergies    No family history on file.   Social History Mr. Fredericksen reports that he has quit smoking. He has never used smokeless tobacco. Mr. Vokes reports no history of alcohol use.   Review of Systems CONSTITUTIONAL: No weight loss, fever, chills,  weakness or fatigue.  HEENT: Eyes: No visual loss, blurred vision, double vision or yellow sclerae.No hearing loss, sneezing, congestion, runny nose or sore throat.  SKIN: No rash or itching.  CARDIOVASCULAR: per hpi RESPIRATORY: No shortness of breath, cough or sputum.  GASTROINTESTINAL: No anorexia, nausea, vomiting or diarrhea. No abdominal pain or blood.  GENITOURINARY: No burning on urination, no polyuria NEUROLOGICAL: occasional dizziness MUSCULOSKELETAL: No muscle, back pain, joint pain or stiffness.  LYMPHATICS: No enlarged nodes. No history of splenectomy.  PSYCHIATRIC: No history of depression or anxiety.  ENDOCRINOLOGIC: No reports of sweating, cold or heat intolerance. No polyuria or polydipsia.  Marland Kitchen   Physical Examination Vitals:   03/16/18 0959  BP: 98/70  Pulse: (!) 59  SpO2: 97%   Vitals:   03/16/18 0959  Weight: 182 lb (82.6 kg)  Height: 5\' 10"  (1.778 m)    Gen: resting comfortably, no acute distress HEENT: no scleral icterus, pupils equal round and reactive, no palptable cervical adenopathy,  CV: RRR, 2/6 systo.ic murmur rusb, no jvd Resp: Clear to auscultation bilaterally GI: abdomen is soft, non-tender, non-distended, normal bowel sounds, no hepatosplenomegaly MSK: extremities are warm, no edema.  Skin: warm, no rash Neuro:  no focal deficits Psych: appropriate affect   Diagnostic Studies  02/2018 echo Study Conclusions  - Left ventricle: The cavity size was normal. Systolic function was   normal. The estimated ejection fraction was in the range of 60%   to  65%. Wall motion was normal; there were no regional wall   motion abnormalities. Left ventricular diastolic function   parameters were normal. - Aortic valve: There was very mild stenosis. Valve area (Vmax):   2.29 cm^2. - Mitral valve: Moderately calcified annulus. Moderately thickened   leaflets . - Left atrium: The atrium was mildly dilated. - Atrial septum: No defect or patent foramen  ovale was identified.   Assessment and Plan   1. Dizziness/Orhotstatic dizziness - severely orthostatic in clinic, SBP drops 43 points with standing. DBP drops 23 points - symptoms improved with oral hydration, cotinue to monitor - If recurrent, consider compression stockings, possibly medical therapy with either florinef or midodrine.   2. Shoulder pains - previous symptoms of exertional pains across the shoulders. No specific chest pains, no significant SOB/DOE - symptoms have completley resolved over the last few weeks - continue to monitor at this time  F/u 4 months     [name], M.D

## 2018-04-09 DIAGNOSIS — E538 Deficiency of other specified B group vitamins: Secondary | ICD-10-CM | POA: Diagnosis not present

## 2018-04-28 DIAGNOSIS — E119 Type 2 diabetes mellitus without complications: Secondary | ICD-10-CM | POA: Diagnosis not present

## 2018-05-11 DIAGNOSIS — E538 Deficiency of other specified B group vitamins: Secondary | ICD-10-CM | POA: Diagnosis not present

## 2018-05-14 DIAGNOSIS — N138 Other obstructive and reflux uropathy: Secondary | ICD-10-CM | POA: Diagnosis not present

## 2018-05-14 DIAGNOSIS — R972 Elevated prostate specific antigen [PSA]: Secondary | ICD-10-CM | POA: Diagnosis not present

## 2018-05-14 DIAGNOSIS — N401 Enlarged prostate with lower urinary tract symptoms: Secondary | ICD-10-CM | POA: Diagnosis not present

## 2018-05-14 DIAGNOSIS — Z8042 Family history of malignant neoplasm of prostate: Secondary | ICD-10-CM | POA: Diagnosis not present

## 2018-05-22 ENCOUNTER — Ambulatory Visit: Payer: Medicare Other | Admitting: Cardiology

## 2018-06-11 DIAGNOSIS — Z79899 Other long term (current) drug therapy: Secondary | ICD-10-CM | POA: Diagnosis not present

## 2018-06-11 DIAGNOSIS — D649 Anemia, unspecified: Secondary | ICD-10-CM | POA: Diagnosis not present

## 2018-06-11 DIAGNOSIS — E1129 Type 2 diabetes mellitus with other diabetic kidney complication: Secondary | ICD-10-CM | POA: Diagnosis not present

## 2018-06-11 DIAGNOSIS — N183 Chronic kidney disease, stage 3 (moderate): Secondary | ICD-10-CM | POA: Diagnosis not present

## 2018-06-12 DIAGNOSIS — E538 Deficiency of other specified B group vitamins: Secondary | ICD-10-CM | POA: Diagnosis not present

## 2018-06-18 DIAGNOSIS — Z6825 Body mass index (BMI) 25.0-25.9, adult: Secondary | ICD-10-CM | POA: Diagnosis not present

## 2018-06-18 DIAGNOSIS — N183 Chronic kidney disease, stage 3 (moderate): Secondary | ICD-10-CM | POA: Diagnosis not present

## 2018-06-18 DIAGNOSIS — E1122 Type 2 diabetes mellitus with diabetic chronic kidney disease: Secondary | ICD-10-CM | POA: Diagnosis not present

## 2018-06-18 DIAGNOSIS — D510 Vitamin B12 deficiency anemia due to intrinsic factor deficiency: Secondary | ICD-10-CM | POA: Diagnosis not present

## 2018-06-20 DIAGNOSIS — E119 Type 2 diabetes mellitus without complications: Secondary | ICD-10-CM | POA: Diagnosis not present

## 2018-07-17 ENCOUNTER — Telehealth: Payer: Self-pay

## 2018-07-17 DIAGNOSIS — E538 Deficiency of other specified B group vitamins: Secondary | ICD-10-CM | POA: Diagnosis not present

## 2018-07-17 NOTE — Telephone Encounter (Signed)
Called pt to inform we are not seeing pta in office, but we are doing virtual visits. He agreed to a phone call.    Virtual Visit Pre-Appointment Phone Call  Steps For Call:  1. Confirm consent - "In the setting of the current Covid19 crisis, you are scheduled for a (phone or video) visit with your provider on (date) at (time).  Just as we do with many in-office visits, in order for you to participate in this visit, we must obtain consent.  If you'd like, I can send this to your mychart (if signed up) or email for you to review.  Otherwise, I can obtain your verbal consent now.  All virtual visits are billed to your insurance company just like a normal visit would be.  By agreeing to a virtual visit, we'd like you to understand that the technology does not allow for your provider to perform an examination, and thus may limit your provider's ability to fully assess your condition.  Finally, though the technology is pretty good, we cannot assure that it will always work on either your or our end, and in the setting of a video visit, we may have to convert it to a phone-only visit.  In either situation, we cannot ensure that we have a secure connection.  Are you willing to proceed?" STAFF: Did the patient verbally acknowledge consent to treatment? Yes 2.   3. Give patient instructions for WebEx/MyChart download to smartphone or Doximity/Doxy.me if video visit (depending on what platform provider is using)  4. Advise patient to be prepared with any vital sign or heart rhythm information, their current medicines, and a piece of paper and pen handy for any instructions they may receive the day of their visit  5. Inform patient they will receive a phone call 15 minutes prior to their appointment time (may be from unknown caller ID) so they should be prepared to answer  6. Confirm that appointment type is correct in Epic appointment notes (video vs telephone)    TELEPHONE CALL NOTE  Eric Brady has been deemed a candidate for a follow-up tele-health visit to limit community exposure during the Covid-19 pandemic. I spoke with the patient via phone to ensure availability of phone/video source, confirm preferred email & phone number, and discuss instructions and expectations.  I reminded Eric Brady to be prepared with any vital sign and/or heart rhythm information that could potentially be obtained via home monitoring, at the time of his visit. I reminded Eric Brady to expect a phone call at the time of his visit if his visit.  Drema Dallas, [facility] 07/17/2018 9:17 AM   DOWNLOADING THE [place], go to [employer] and type in WebEx in the search bar. [place] [employer], the blue/green circle. The app is free but as with any other app downloads, their phone may require them to verify saved payment information or Apple password. The patient does NOT have to create an account.  - If Android, ask patient to go to [employer] and type in WebEx in the search bar. [place] [employer], the blue/green circle. The app is free but as with any other app downloads, their phone may require them to verify saved payment information or Android password. The patient does NOT have to create an account.   CONSENT FOR TELE-HEALTH VISIT - PLEASE REVIEW  I hereby voluntarily request, consent and authorize [place] and  its employed or Journalist, newspaper, Engineer, materials, nurse practitioners or other licensed health care professionals (the Practitioner), to provide me with telemedicine health care services (the "Services") as deemed necessary by the treating Practitioner. I acknowledge and consent to receive the Services by the Practitioner via telemedicine. I understand that the telemedicine visit will involve communicating with the Practitioner through live audiovisual communication technology and the disclosure of certain  medical information by electronic transmission. I acknowledge that I have been given the opportunity to request an in-person assessment or other available alternative prior to the telemedicine visit and am voluntarily participating in the telemedicine visit.  I understand that I have the right to withhold or withdraw my consent to the use of telemedicine in the course of my care at any time, without affecting my right to future care or treatment, and that the Practitioner or I may terminate the telemedicine visit at any time. I understand that I have the right to inspect all information obtained and/or recorded in the course of the telemedicine visit and may receive copies of available information for a reasonable fee.  I understand that some of the potential risks of receiving the Services via telemedicine include:  Marland Kitchen Delay or interruption in medical evaluation due to technological equipment failure or disruption; . Information transmitted may not be sufficient (e.g. poor resolution of images) to allow for appropriate medical decision making by the Practitioner; and/or  . In rare instances, security protocols could fail, causing a breach of personal health information.  Furthermore, I acknowledge that it is my responsibility to provide information about my medical history, conditions and care that is complete and accurate to the best of my ability. I acknowledge that Practitioner's advice, recommendations, and/or decision may be based on factors not within their control, such as incomplete or inaccurate data provided by me or distortions of diagnostic images or specimens that may result from electronic transmissions. I understand that the practice of medicine is not an exact science and that Practitioner makes no warranties or guarantees regarding treatment outcomes. I acknowledge that I will receive a copy of this consent concurrently upon execution via email to the email address I last provided but may  also request a printed copy by calling the office of [facility].    I understand that my insurance will be billed for this visit.   I have read or had this consent read to me. . I understand the contents of this consent, which adequately explains the benefits and risks of the Services being provided via telemedicine.  . I have been provided ample opportunity to ask questions regarding this consent and the Services and have had my questions answered to my satisfaction. . I give my informed consent for the services to be provided through the use of telemedicine in my medical care  By participating in this telemedicine visit I agree to the above.

## 2018-07-20 ENCOUNTER — Telehealth: Payer: Self-pay | Admitting: *Deleted

## 2018-07-20 NOTE — Telephone Encounter (Signed)
Meds and pharmacy reviewed. Pt unable to check BP at home.

## 2018-07-23 ENCOUNTER — Telehealth (INDEPENDENT_AMBULATORY_CARE_PROVIDER_SITE_OTHER): Payer: Medicare Other | Admitting: Cardiology

## 2018-07-23 VITALS — BP 104/56 | HR 64 | Ht 71.0 in | Wt 178.0 lb

## 2018-07-23 DIAGNOSIS — M25512 Pain in left shoulder: Secondary | ICD-10-CM | POA: Diagnosis not present

## 2018-07-23 DIAGNOSIS — R42 Dizziness and giddiness: Secondary | ICD-10-CM

## 2018-07-23 DIAGNOSIS — M25511 Pain in right shoulder: Secondary | ICD-10-CM

## 2018-07-23 NOTE — Progress Notes (Signed)
Virtual Visit via Telephone Note   This visit type was conducted due to national recommendations for restrictions regarding the COVID-19 Pandemic (e.g. social distancing) in an effort to limit this patient's exposure and mitigate transmission in our community.  Due to his co-morbid illnesses, this patient is at least at moderate risk for complications without adequate follow up.  This format is felt to be most appropriate for this patient at this time.  The patient did not have access to video technology/had technical difficulties with video requiring transitioning to audio format only (telephone).  All issues noted in this document were discussed and addressed.  No physical exam could be performed with this format.  Please refer to the patient's chart for his  consent to telehealth for [facility].   Evaluation Performed:  Follow-up visit  Date:  07/23/2018   ID:  Bridget, [place] [birth year], MRN [id number]  Patient Location: Home Provider Location: Home  PCP:  Asencion Noble, MD  Cardiologist:  Carlyle Dolly, MD  Electrophysiologist:  None   Chief Complaint:  4 month follow up  History of Present Illness:    Eric Brady is a 85 y.o. male seen today for follow up of the following medical problems    1. Dizzy spells - previously found to be severely orthostatic in clinic, SBP dropped 43 points with standing. Overall poor oral hydration  - some episodes with standing still. Drinking regularly, not increasing his sodium intake. Mild symptoms overall   2. Shoulder pain - previously had shoulder with exertion, bilateral. No other associated symptoms. Better with tylenol. Not positional. Aching pain. Last episode several week ago.  - denies any recent symptoms.    The patient does not have symptoms concerning for COVID-19 infection (fever, chills, cough, or new shortness of breath).    SH: wife passed yesterday, complications from pneumonia.   Past Medical  History:  Diagnosis Date  . Anemia   . Diabetes mellitus without complication ([place])   . Hyperlipidemia    Past Surgical History:  Procedure Laterality Date  . COLONOSCOPY N/A 09/12/2012   Procedure: COLONOSCOPY;  Surgeon: Rogene Houston, MD;  Location: [facility]ITE;  Service: Endoscopy;  Laterality: N/A;  915  . EYE SURGERY    . ORIF ANKLE FRACTURE Right   . YAG LASER APPLICATION Left 5/78/4696   Procedure: YAG LASER APPLICATION;  Surgeon: Williams Che, MD;  Location: [facility] ORS;  Service: Ophthalmology;  Laterality: Left;     Current Meds  Medication Sig  . atorvastatin (LIPITOR) 10 MG tablet Take 10 mg by mouth daily.  . cyanocobalamin (,VITAMIN B-12,) 1000 MCG/ML injection Inject 1,000 mcg into the muscle every 30 (thirty) days.  . metFORMIN (GLUCOPHAGE-XR) 500 MG 24 hr tablet Take 500 mg by mouth daily.     Allergies:   Patient has no known allergies.   Social History   Tobacco Use  . Smoking status: Former Research scientist (life sciences)  . Smokeless tobacco: Never Used  Substance Use Topics  . Alcohol use: No  . Drug use: No     Family Hx: The patient's family history includes Cancer in his mother; Prostate cancer in his brother.  ROS:   Please see the history of present illness.    All other systems reviewed and are negative.   Prior CV studies:   The following studies were reviewed today:  02/2018 echo Study Conclusions  - Left ventricle: The cavity size was normal. Systolic function was normal. The estimated ejection  fraction was in the range of 60% to 65%. Wall motion was normal; there were no regional wall motion abnormalities. Left ventricular diastolic function parameters were normal. - Aortic valve: There was very mild stenosis. Valve area (Vmax): 2.29 cm^2. - Mitral valve: Moderately calcified annulus. Moderately thickened leaflets . - Left atrium: The atrium was mildly dilated. - Atrial septum: No defect or patent foramen ovale was identified.   Labs/Other Tests and Data Reviewed:    EKG:  na  Recent Labs: No results found for requested labs within last 8760 hours.   Recent Lipid Panel No results found for: CHOL, TRIG, HDL, CHOLHDL, LDLCALC, LDLDIRECT  Wt Readings from Last 3 Encounters:  03/16/18 182 lb (82.6 kg)  02/15/18 179 lb 12.8 oz (81.6 kg)  02/07/15 174 lb (78.9 kg)     Objective:    Vital Signs:  p 64 bp 104/56  Normal affect, normal speech pattern and tone. Sounds comfortable in no distress. No audible sounds of heavy breathing or wheezing.   ASSESSMENT & PLAN:    1. Dizziness/Orhotstatic dizziness - severely orthostatic in clinic, SBP drops 43 points with standing. DBP drops 23 points - symptoms improved with oral hydration. Encouraged to increase his sodium intake - continue to monitor. If recurrent consider compresson garments, possibly midodrine vs florinef    F/u 6 months  COVID-19 Education: The signs and symptoms of COVID-19 were discussed with the patient and how to seek care for testing (follow up with PCP or arrange E-visit).  The importance of social distancing was discussed today.  Time:   Today, I have spent 15 minutes with the patient with telehealth technology discussing the above problems.     Medication Adjustments/Labs and Tests Ordered: Current medicines are reviewed at length with the patient today.  Concerns regarding medicines are outlined above.   Tests Ordered: No orders of the defined types were placed in this encounter.   Medication Changes: No orders of the defined types were placed in this encounter.   Disposition:  Follow up 6 months  Signed, Carlyle Dolly, MD  07/23/2018 8:01 AM    [place]

## 2018-07-23 NOTE — Patient Instructions (Signed)
Medication Instructions: Your physician recommends that you continue on your current medications as directed. Please refer to the Current Medication list given to you today.   Labwork: None today  Procedures/Testing: None today  Follow-Up: 6 months with Dr.Branch  Any Additional Special Instructions Will Be Listed Below (If Applicable).     If you need a refill on your cardiac medications before your next appointment, please call your pharmacy.   Thank you for choosing [facility] !

## 2018-07-24 ENCOUNTER — Encounter: Payer: Self-pay | Admitting: Internal Medicine

## 2018-08-14 DIAGNOSIS — E119 Type 2 diabetes mellitus without complications: Secondary | ICD-10-CM | POA: Diagnosis not present

## 2018-08-20 DIAGNOSIS — E538 Deficiency of other specified B group vitamins: Secondary | ICD-10-CM | POA: Diagnosis not present

## 2018-08-31 ENCOUNTER — Telehealth: Payer: Self-pay | Admitting: Cardiology

## 2018-08-31 NOTE — Telephone Encounter (Signed)
Patient was scheduled for a prostate biopsy today. States that that he became faint due to a low BP. (standing BP was in the 70's. Dr. Rosana Hoes suggested that Dr. Harl Bowie be notified.  Please call son Rosbel Buckner) [phone].

## 2018-08-31 NOTE — Telephone Encounter (Signed)
Spoke with pt's son. He stated that the patient knew he was having biopsy today, so he had not eaten anything since 5 pm yesterday. He is not sure if he had taken in a lot of fluids, but his best guess is that he hadn't. He walked up a flight of stairs prior to the appointment as the office is in 2nd floor, verses taking the elevator. His blood pressure was 120/? Sitting, but standing it was 70/?Marland Kitchen They refused to do his biopsy because of the drastic change. Pt felt fine during the majority of the visit. Pt's son states that he know's it is an ongoing issue, and pt has not complained about symptoms recently. Please advise.

## 2018-08-31 NOTE — Telephone Encounter (Signed)
Can we get some more information. At our visit last month he was doing pretty well. Has the dizziness been a more frequent issue since our last visit? Was there anything else out of the ordinary that day of the biopsy, had he not been eating or drinking well around that time?   J Eric Leeth MD

## 2018-08-31 NOTE — Telephone Encounter (Signed)
Will forward to Dr. Harl Bowie. After looking back over recent office visits, this is an ongoing issue with pt.

## 2018-09-03 NOTE — Telephone Encounter (Signed)
Ok to proceed with biopsy from our standpoint. He has known orthostatic hypotension, treatment is based on the symptoms and not the drop in blood pressure with standing. If he is not having significant symptoms then we just continue aggressive hydration, if he were to have symptoms then other things we could try including medications.    Zandra Abts MD

## 2018-09-04 NOTE — Telephone Encounter (Signed)
Returned  Call. Spoke with pt, and he states that he feels pretty good overall. He is supposed to have his biopsy in 2 weeks. He has increased his water intake and salt intake and it seems to have made a difference. He does not want to take medication at this point. He will keep you updated on any changes.

## 2018-09-25 DIAGNOSIS — E538 Deficiency of other specified B group vitamins: Secondary | ICD-10-CM | POA: Diagnosis not present

## 2018-10-04 DIAGNOSIS — E119 Type 2 diabetes mellitus without complications: Secondary | ICD-10-CM | POA: Diagnosis not present

## 2018-10-11 DIAGNOSIS — E1129 Type 2 diabetes mellitus with other diabetic kidney complication: Secondary | ICD-10-CM | POA: Diagnosis not present

## 2018-10-11 DIAGNOSIS — I951 Orthostatic hypotension: Secondary | ICD-10-CM | POA: Diagnosis not present

## 2018-10-11 DIAGNOSIS — D510 Vitamin B12 deficiency anemia due to intrinsic factor deficiency: Secondary | ICD-10-CM | POA: Diagnosis not present

## 2018-10-11 DIAGNOSIS — N183 Chronic kidney disease, stage 3 (moderate): Secondary | ICD-10-CM | POA: Diagnosis not present

## 2018-10-18 DIAGNOSIS — E1122 Type 2 diabetes mellitus with diabetic chronic kidney disease: Secondary | ICD-10-CM | POA: Diagnosis not present

## 2018-10-18 DIAGNOSIS — I951 Orthostatic hypotension: Secondary | ICD-10-CM | POA: Diagnosis not present

## 2018-10-18 DIAGNOSIS — N183 Chronic kidney disease, stage 3 (moderate): Secondary | ICD-10-CM | POA: Diagnosis not present

## 2018-10-23 ENCOUNTER — Other Ambulatory Visit: Payer: Self-pay

## 2018-10-23 ENCOUNTER — Other Ambulatory Visit: Payer: Medicare Other

## 2018-10-23 DIAGNOSIS — Z20822 Contact with and (suspected) exposure to covid-19: Secondary | ICD-10-CM

## 2018-10-25 LAB — NOVEL CORONAVIRUS, NAA: SARS-CoV-2, NAA: NOT DETECTED

## 2018-10-30 DIAGNOSIS — E538 Deficiency of other specified B group vitamins: Secondary | ICD-10-CM | POA: Diagnosis not present

## 2018-10-31 DIAGNOSIS — R972 Elevated prostate specific antigen [PSA]: Secondary | ICD-10-CM | POA: Diagnosis not present

## 2018-10-31 DIAGNOSIS — I350 Nonrheumatic aortic (valve) stenosis: Secondary | ICD-10-CM | POA: Insufficient documentation

## 2018-10-31 DIAGNOSIS — Z20828 Contact with and (suspected) exposure to other viral communicable diseases: Secondary | ICD-10-CM | POA: Diagnosis not present

## 2018-10-31 DIAGNOSIS — E785 Hyperlipidemia, unspecified: Secondary | ICD-10-CM | POA: Insufficient documentation

## 2018-10-31 DIAGNOSIS — I959 Hypotension, unspecified: Secondary | ICD-10-CM | POA: Insufficient documentation

## 2018-10-31 DIAGNOSIS — E119 Type 2 diabetes mellitus without complications: Secondary | ICD-10-CM | POA: Insufficient documentation

## 2018-10-31 DIAGNOSIS — Z1159 Encounter for screening for other viral diseases: Secondary | ICD-10-CM | POA: Diagnosis not present

## 2018-10-31 DIAGNOSIS — Z01812 Encounter for preprocedural laboratory examination: Secondary | ICD-10-CM | POA: Diagnosis not present

## 2018-11-07 DIAGNOSIS — R972 Elevated prostate specific antigen [PSA]: Secondary | ICD-10-CM | POA: Diagnosis not present

## 2018-11-07 DIAGNOSIS — C61 Malignant neoplasm of prostate: Secondary | ICD-10-CM | POA: Diagnosis not present

## 2018-11-07 DIAGNOSIS — N411 Chronic prostatitis: Secondary | ICD-10-CM | POA: Diagnosis not present

## 2018-11-16 DIAGNOSIS — C61 Malignant neoplasm of prostate: Secondary | ICD-10-CM | POA: Diagnosis not present

## 2018-11-23 DIAGNOSIS — C61 Malignant neoplasm of prostate: Secondary | ICD-10-CM | POA: Diagnosis not present

## 2018-12-07 DIAGNOSIS — E538 Deficiency of other specified B group vitamins: Secondary | ICD-10-CM | POA: Diagnosis not present

## 2018-12-07 DIAGNOSIS — C61 Malignant neoplasm of prostate: Secondary | ICD-10-CM | POA: Diagnosis not present

## 2018-12-07 DIAGNOSIS — I951 Orthostatic hypotension: Secondary | ICD-10-CM | POA: Diagnosis not present

## 2019-01-03 DIAGNOSIS — C61 Malignant neoplasm of prostate: Secondary | ICD-10-CM | POA: Diagnosis not present

## 2019-01-03 DIAGNOSIS — N390 Urinary tract infection, site not specified: Secondary | ICD-10-CM | POA: Diagnosis not present

## 2019-01-04 DIAGNOSIS — N3000 Acute cystitis without hematuria: Secondary | ICD-10-CM | POA: Diagnosis not present

## 2019-01-05 ENCOUNTER — Emergency Department (HOSPITAL_COMMUNITY)
Admission: EM | Admit: 2019-01-05 | Discharge: 2019-01-05 | Disposition: A | Discharge status: Home / Self Care | Payer: Medicare Other | Attending: Emergency Medicine | Admitting: Emergency Medicine

## 2019-01-05 ENCOUNTER — Other Ambulatory Visit: Payer: Self-pay

## 2019-01-05 ENCOUNTER — Emergency Department (HOSPITAL_COMMUNITY): Payer: Medicare Other

## 2019-01-05 ENCOUNTER — Encounter (HOSPITAL_COMMUNITY): Payer: Self-pay | Admitting: Emergency Medicine

## 2019-01-05 DIAGNOSIS — R1030 Lower abdominal pain, unspecified: Secondary | ICD-10-CM | POA: Diagnosis not present

## 2019-01-05 DIAGNOSIS — K8020 Calculus of gallbladder without cholecystitis without obstruction: Secondary | ICD-10-CM | POA: Diagnosis not present

## 2019-01-05 DIAGNOSIS — Z7982 Long term (current) use of aspirin: Secondary | ICD-10-CM | POA: Insufficient documentation

## 2019-01-05 DIAGNOSIS — Z87891 Personal history of nicotine dependence: Secondary | ICD-10-CM | POA: Insufficient documentation

## 2019-01-05 DIAGNOSIS — E119 Type 2 diabetes mellitus without complications: Secondary | ICD-10-CM | POA: Insufficient documentation

## 2019-01-05 DIAGNOSIS — N1330 Unspecified hydronephrosis: Secondary | ICD-10-CM | POA: Diagnosis not present

## 2019-01-05 DIAGNOSIS — R339 Retention of urine, unspecified: Secondary | ICD-10-CM

## 2019-01-05 DIAGNOSIS — Z7984 Long term (current) use of oral hypoglycemic drugs: Secondary | ICD-10-CM | POA: Insufficient documentation

## 2019-01-05 DIAGNOSIS — Z8546 Personal history of malignant neoplasm of prostate: Secondary | ICD-10-CM | POA: Diagnosis not present

## 2019-01-05 HISTORY — DX: Malignant (primary) neoplasm, unspecified: C80.1

## 2019-01-05 LAB — CBC WITH DIFFERENTIAL/PLATELET
Abs Immature Granulocytes: 0.01 10*3/uL (ref 0.00–0.07)
Basophils Absolute: 0.1 10*3/uL (ref 0.0–0.1)
Basophils Relative: 1 %
Eosinophils Absolute: 0.2 10*3/uL (ref 0.0–0.5)
Eosinophils Relative: 3 %
HCT: 38.9 % — ABNORMAL LOW (ref 39.0–52.0)
Hemoglobin: 12 g/dL — ABNORMAL LOW (ref 13.0–17.0)
Immature Granulocytes: 0 %
Lymphocytes Relative: 36 %
Lymphs Abs: 2.6 10*3/uL (ref 0.7–4.0)
MCH: 28.6 pg (ref 26.0–34.0)
MCHC: 30.8 g/dL (ref 30.0–36.0)
MCV: 92.8 fL (ref 80.0–100.0)
Monocytes Absolute: 0.6 10*3/uL (ref 0.1–1.0)
Monocytes Relative: 9 %
Neutro Abs: 3.7 10*3/uL (ref 1.7–7.7)
Neutrophils Relative %: 51 %
Platelets: 192 10*3/uL (ref 150–400)
RBC: 4.19 MIL/uL — ABNORMAL LOW (ref 4.22–5.81)
RDW: 13.1 % (ref 11.5–15.5)
WBC: 7.2 10*3/uL (ref 4.0–10.5)
nRBC: 0 % (ref 0.0–0.2)

## 2019-01-05 LAB — COMPREHENSIVE METABOLIC PANEL
ALT: 36 U/L (ref 0–44)
AST: 33 U/L (ref 15–41)
Albumin: 4.4 g/dL (ref 3.5–5.0)
Alkaline Phosphatase: 65 U/L (ref 38–126)
Anion gap: 7 (ref 5–15)
BUN: 29 mg/dL — ABNORMAL HIGH (ref 8–23)
CO2: 21 mmol/L — ABNORMAL LOW (ref 22–32)
Calcium: 9.2 mg/dL (ref 8.9–10.3)
Chloride: 109 mmol/L (ref 98–111)
Creatinine, Ser: 1.81 mg/dL — ABNORMAL HIGH (ref 0.61–1.24)
GFR calc Af Amer: 39 mL/min — ABNORMAL LOW (ref >60)
GFR calc non Af Amer: 34 mL/min — ABNORMAL LOW (ref >60)
Glucose, Bld: 143 mg/dL — ABNORMAL HIGH (ref 70–99)
Potassium: 4.7 mmol/L (ref 3.5–5.1)
Sodium: 137 mmol/L (ref 135–145)
Total Bilirubin: 0.5 mg/dL (ref 0.3–1.2)
Total Protein: 7.6 g/dL (ref 6.5–8.1)

## 2019-01-05 LAB — URINALYSIS, ROUTINE W REFLEX MICROSCOPIC
Bilirubin Urine: NEGATIVE
Glucose, UA: 50 mg/dL — AB
Hgb urine dipstick: NEGATIVE
Ketones, ur: NEGATIVE mg/dL
Leukocytes,Ua: NEGATIVE
Nitrite: NEGATIVE
Protein, ur: NEGATIVE mg/dL
Specific Gravity, Urine: 1.009 (ref 1.005–1.030)
pH: 5 (ref 5.0–8.0)

## 2019-01-05 MED ORDER — HYDROMORPHONE HCL 1 MG/ML IJ SOLN
0.5000 mg | Freq: Once | INTRAMUSCULAR | Status: AC
  Administered 2019-01-05: 10:00:00 0.5 mg via INTRAVENOUS
  Filled 2019-01-05: qty 1

## 2019-01-05 MED ORDER — ONDANSETRON HCL 4 MG/2ML IJ SOLN
4.0000 mg | Freq: Once | INTRAMUSCULAR | Status: AC
  Administered 2019-01-05: 10:00:00 4 mg via INTRAVENOUS
  Filled 2019-01-05: qty 2

## 2019-01-05 NOTE — ED Triage Notes (Signed)
Pt c/o pain to scrotum that began yesterday. Pt endorses urinary frequency. Pt states he had a CT scan on Thursday and found out he had prostate cancer. Denies swelling to scrotum, abdominal pain/flank pain, or n/v.

## 2019-01-05 NOTE — ED Notes (Signed)
Patient transported to CT 

## 2019-01-05 NOTE — ED Provider Notes (Signed)
[facility] EMERGENCY DEPARTMENT Provider Note   CSN: [id number] Arrival date & time: 01/05/19  0907     History   Chief Complaint Chief Complaint  Patient presents with  . Groin Pain    HPI Eric Brady is a 85 y.o. male.     Patient complains of pain suprapubic.  Patient also complains of difficulty urinating.  He has history of prostate cancer and is going to get treatment soon  The history is provided by the patient. No language interpreter was used.  Groin Pain This is a recurrent problem. The current episode started 3 to 5 hours ago. The problem occurs constantly. The problem has not changed since onset.Pertinent negatives include no chest pain, no abdominal pain and no headaches. Nothing aggravates the symptoms. Nothing relieves the symptoms.    Past Medical History:  Diagnosis Date  . Anemia   . Cancer [facility])    Prostate  . Diabetes mellitus without complication ([place])   . Hyperlipidemia     There are no active problems to display for this patient.   Past Surgical History:  Procedure Laterality Date  . COLONOSCOPY N/A 09/12/2012   Procedure: COLONOSCOPY;  Surgeon: Rogene Houston, MD;  Location: [facility] ENDO SU[facility];  Service: Endoscopy;  Laterality: N/A;  915  . EYE SURGERY    . ORIF ANKLE FRACTURE Right   . YAG LASER APPLICATION Left 10/12/6267   Procedure: YAG LASER APPLICATION;  Surgeon: Williams Che, MD;  Location: [facility] ORS;  Service: Ophthalmology;  Laterality: Left;        Home Medications    Prior to Admission medications   Medication Sig Start Date End Date Taking? Authorizing Provider  atorvastatin (LIPITOR) 10 MG tablet Take 10 mg by mouth daily.   Yes [provider]  calcium-vitamin D (OSCAL WITH D) 500-200 MG-UNIT tablet Take 3 tablets by mouth daily.   Yes [provider]  ciprofloxacin (CIPRO) 500 MG tablet Take 500 mg by mouth 2 (two) times daily.   Yes [provider]  cyanocobalamin (,VITAMIN B-12,) 1000  MCG/ML injection Inject 1,000 mcg into the muscle every 30 (thirty) days.   Yes [provider]  metFORMIN (GLUCOPHAGE-XR) 500 MG 24 hr tablet Take 500 mg by mouth daily. 01/28/15  Yes [provider]  midodrine (PROAMATINE) 2.5 MG tablet Take 5 mg by mouth daily. 10/18/18  Yes [provider]  tamsulosin (FLOMAX) 0.4 MG CAPS capsule Take 0.4 mg by mouth daily.   Yes [provider]  aspirin 81 MG tablet Take 81 mg by mouth daily.    [provider]    Family History Family History  Problem Relation Age of Onset  . Cancer Mother   . Prostate cancer Brother     Social History Social History   Tobacco Use  . Smoking status: Former Research scientist (life sciences)  . Smokeless tobacco: Never Used  Substance Use Topics  . Alcohol use: No  . Drug use: No     Allergies   Patient has no known allergies.   Review of Systems Review of Systems  Constitutional: Negative for appetite change and fatigue.  HENT: Negative for congestion, ear discharge and sinus pressure.   Eyes: Negative for discharge.  Respiratory: Negative for cough.   Cardiovascular: Negative for chest pain.  Gastrointestinal: Negative for abdominal pain and diarrhea.  Genitourinary: Negative for frequency and hematuria.       Pain over bladder  Musculoskeletal: Negative for back pain.  Skin: Negative for rash.  Neurological: Negative for seizures and headaches.  Psychiatric/Behavioral: Negative for hallucinations.     Physical Exam Updated Vital Signs BP (!) 166/69   Pulse (!) 44   Temp 97.7 F (36.5 C) (Oral)   Resp 16   Ht 5\' 11"  (1.803 m)   Wt 80.3 kg   SpO2 98%   BMI 24.69 kg/m   Physical Exam Vitals signs and nursing note reviewed.  Constitutional:      Appearance: He is well-developed.  HENT:     Head: Normocephalic.     Nose: Nose normal.  Eyes:     General: No scleral icterus.    Conjunctiva/sclera: Conjunctivae normal.  Neck:     Musculoskeletal: Neck supple.      Thyroid: No thyromegaly.  Cardiovascular:     Rate and Rhythm: Normal rate and regular rhythm.     Heart sounds: No murmur. No friction rub. No gallop.   Pulmonary:     Breath sounds: No stridor. No wheezing or rales.  Chest:     Chest wall: No tenderness.  Abdominal:     General: There is no distension.     Tenderness: There is no abdominal tenderness. There is no rebound.  Genitourinary:    Comments: Tender suprapubic Musculoskeletal: Normal range of motion.  Lymphadenopathy:     Cervical: No cervical adenopathy.  Skin:    Findings: No erythema or rash.  Neurological:     Mental Status: He is oriented to person, place, and time.     Motor: No abnormal muscle tone.     Coordination: Coordination normal.  Psychiatric:        Behavior: Behavior normal.      ED Treatments / Results  Labs (all labs ordered are listed, but only abnormal results are displayed) Labs Reviewed  URINALYSIS, ROUTINE W REFLEX MICROSCOPIC - Abnormal; Notable for the following components:      Result Value   Color, Urine STRAW (*)    Glucose, UA 50 (*)    All other components within normal limits  CBC WITH DIFFERENTIAL/PLATELET - Abnormal; Notable for the following components:   RBC 4.19 (*)    Hemoglobin 12.0 (*)    HCT 38.9 (*)    All other components within normal limits  COMPREHENSIVE METABOLIC PANEL - Abnormal; Notable for the following components:   CO2 21 (*)    Glucose, Bld 143 (*)    BUN 29 (*)    Creatinine, Ser 1.81 (*)    GFR calc non Af Amer 34 (*)    GFR calc Af Amer 39 (*)    All other components within normal limits  URINE CULTURE    EKG None  Radiology Ct Renal Stone Study  Result Date: 01/05/2019 CLINICAL DATA:  Flank pain EXAM: CT ABDOMEN AND PELVIS WITHOUT CONTRAST TECHNIQUE: Multidetector CT imaging of the abdomen and pelvis was performed following the standard protocol without IV contrast. COMPARISON:  CT dated November 14, 2014. FINDINGS: Lower chest: The lung bases  are clear. The heart size is normal. Hepatobiliary: The liver is normal. Cholelithiasis without acute inflammation.There is no biliary ductal dilation. Pancreas: Normal contours without ductal dilatation. No peripancreatic fluid collection. Spleen: No splenic laceration or hematoma. Adrenals/Urinary Tract: --Adrenal glands: No adrenal hemorrhage. --Right kidney/ureter: There are areas of cortical scarring involving the right kidney. There is no radiopaque stone. There is mild to moderate right-sided hydroureteronephrosis to the level of the urinary bladder. --Left kidney/ureter: No radiopaque stones are identified. Cortical scarring is noted. There  is mild to moderate left-sided hydroureteronephrosis to the level of the urinary bladder. --Urinary bladder: The urinary bladder is significantly distended measuring approximately 1200 cc in total volume. There is a posterior bladder wall diverticulum on the right measuring approximately 5.2 by 4 cm. Stomach/Bowel: --Stomach/Duodenum: No hiatal hernia or other gastric abnormality. Normal duodenal course and caliber. --Small bowel: No dilatation or inflammation. --Colon: Rectosigmoid diverticulosis without acute inflammation. --Appendix: Normal. Vascular/Lymphatic: Atherosclerotic calcification is present within the non-aneurysmal abdominal aorta, without hemodynamically significant stenosis. --No retroperitoneal lymphadenopathy. --No mesenteric lymphadenopathy. --No pelvic or inguinal lymphadenopathy. Reproductive: Prostate gland is mildly enlarged. Other: No ascites or free air. The abdominal wall is normal. Musculoskeletal. There are old left-sided rib fractures. There are multilevel degenerative changes throughout the lumbar spine. IMPRESSION: 1. Bilateral mild-to-moderate hydroureteronephrosis to the level of the urinary bladder. These findings are likely secondary to the patient's significantly distended urinary bladder. The patient may benefit from Foley catheter  placement. 2. There is a posterior bladder wall diverticulum on the right measuring approximately 5.2 x 4 cm. 3. Cholelithiasis without acute inflammation. 4. Rectosigmoid diverticulosis without acute inflammation. Aortic Atherosclerosis (ICD10-I70.0). Electronically Signed   By: Constance Holster M.D.   On: 01/05/2019 10:57    Procedures Procedures (including critical care time)  Medications Ordered in ED Medications  HYDROmorphone (DILAUDID) injection 0.5 mg (0.5 mg Intravenous Given 01/05/19 1020)  ondansetron (ZOFRAN) injection 4 mg (4 mg Intravenous Given 01/05/19 1020)     Initial Impression / Assessment and Plan / ED Course  I have reviewed the triage vital signs and the nursing notes.  Pertinent labs & imaging results that were available during my care of the patient were reviewed by me and considered in my medical decision making (see chart for details).   Patient with urinary retention.  Labs unremarkable and CT scan shows enlarged bladder.  Patient improved with Foley catheter and will follow-up with his urologist Final Clinical Impressions(s) / ED Diagnoses   Final diagnoses:  Urinary retention    ED Discharge Orders    None       Milton Ferguson, MD 01/05/19 1429

## 2019-01-05 NOTE — Discharge Instructions (Addendum)
Follow-up with your urologist this week. °

## 2019-01-05 NOTE — ED Notes (Signed)
Pt given leg bag for home use.

## 2019-01-07 LAB — URINE CULTURE: Culture: NO GROWTH

## 2019-01-08 DIAGNOSIS — Z466 Encounter for fitting and adjustment of urinary device: Secondary | ICD-10-CM | POA: Diagnosis not present

## 2019-01-08 DIAGNOSIS — Z510 Encounter for antineoplastic radiation therapy: Secondary | ICD-10-CM | POA: Diagnosis not present

## 2019-01-08 DIAGNOSIS — R339 Retention of urine, unspecified: Secondary | ICD-10-CM | POA: Diagnosis not present

## 2019-01-08 DIAGNOSIS — N320 Bladder-neck obstruction: Secondary | ICD-10-CM | POA: Diagnosis not present

## 2019-01-08 DIAGNOSIS — C61 Malignant neoplasm of prostate: Secondary | ICD-10-CM | POA: Diagnosis not present

## 2019-01-14 DIAGNOSIS — E538 Deficiency of other specified B group vitamins: Secondary | ICD-10-CM | POA: Diagnosis not present

## 2019-01-14 DIAGNOSIS — Z23 Encounter for immunization: Secondary | ICD-10-CM | POA: Diagnosis not present

## 2019-01-16 DIAGNOSIS — Z510 Encounter for antineoplastic radiation therapy: Secondary | ICD-10-CM | POA: Diagnosis not present

## 2019-01-16 DIAGNOSIS — C61 Malignant neoplasm of prostate: Secondary | ICD-10-CM | POA: Diagnosis not present

## 2019-01-17 DIAGNOSIS — C61 Malignant neoplasm of prostate: Secondary | ICD-10-CM | POA: Diagnosis not present

## 2019-01-17 DIAGNOSIS — Z510 Encounter for antineoplastic radiation therapy: Secondary | ICD-10-CM | POA: Diagnosis not present

## 2019-01-18 DIAGNOSIS — Z510 Encounter for antineoplastic radiation therapy: Secondary | ICD-10-CM | POA: Diagnosis not present

## 2019-01-18 DIAGNOSIS — C61 Malignant neoplasm of prostate: Secondary | ICD-10-CM | POA: Diagnosis not present

## 2019-01-21 DIAGNOSIS — C61 Malignant neoplasm of prostate: Secondary | ICD-10-CM | POA: Diagnosis not present

## 2019-01-21 DIAGNOSIS — Z510 Encounter for antineoplastic radiation therapy: Secondary | ICD-10-CM | POA: Diagnosis not present

## 2019-01-22 DIAGNOSIS — Z510 Encounter for antineoplastic radiation therapy: Secondary | ICD-10-CM | POA: Diagnosis not present

## 2019-01-22 DIAGNOSIS — C61 Malignant neoplasm of prostate: Secondary | ICD-10-CM | POA: Diagnosis not present

## 2019-01-23 DIAGNOSIS — Z510 Encounter for antineoplastic radiation therapy: Secondary | ICD-10-CM | POA: Diagnosis not present

## 2019-01-23 DIAGNOSIS — C61 Malignant neoplasm of prostate: Secondary | ICD-10-CM | POA: Diagnosis not present

## 2019-01-24 DIAGNOSIS — C61 Malignant neoplasm of prostate: Secondary | ICD-10-CM | POA: Diagnosis not present

## 2019-01-24 DIAGNOSIS — Z510 Encounter for antineoplastic radiation therapy: Secondary | ICD-10-CM | POA: Diagnosis not present

## 2019-01-25 DIAGNOSIS — Z510 Encounter for antineoplastic radiation therapy: Secondary | ICD-10-CM | POA: Diagnosis not present

## 2019-01-25 DIAGNOSIS — I951 Orthostatic hypotension: Secondary | ICD-10-CM | POA: Diagnosis not present

## 2019-01-25 DIAGNOSIS — C61 Malignant neoplasm of prostate: Secondary | ICD-10-CM | POA: Diagnosis not present

## 2019-01-28 DIAGNOSIS — C61 Malignant neoplasm of prostate: Secondary | ICD-10-CM | POA: Diagnosis not present

## 2019-01-28 DIAGNOSIS — Z510 Encounter for antineoplastic radiation therapy: Secondary | ICD-10-CM | POA: Diagnosis not present

## 2019-01-29 DIAGNOSIS — C61 Malignant neoplasm of prostate: Secondary | ICD-10-CM | POA: Diagnosis not present

## 2019-01-29 DIAGNOSIS — Z510 Encounter for antineoplastic radiation therapy: Secondary | ICD-10-CM | POA: Diagnosis not present

## 2019-01-30 DIAGNOSIS — C61 Malignant neoplasm of prostate: Secondary | ICD-10-CM | POA: Diagnosis not present

## 2019-01-30 DIAGNOSIS — Z510 Encounter for antineoplastic radiation therapy: Secondary | ICD-10-CM | POA: Diagnosis not present

## 2019-01-31 DIAGNOSIS — Z510 Encounter for antineoplastic radiation therapy: Secondary | ICD-10-CM | POA: Diagnosis not present

## 2019-01-31 DIAGNOSIS — C61 Malignant neoplasm of prostate: Secondary | ICD-10-CM | POA: Diagnosis not present

## 2019-02-01 DIAGNOSIS — Z510 Encounter for antineoplastic radiation therapy: Secondary | ICD-10-CM | POA: Diagnosis not present

## 2019-02-01 DIAGNOSIS — C61 Malignant neoplasm of prostate: Secondary | ICD-10-CM | POA: Diagnosis not present

## 2019-02-04 DIAGNOSIS — Z510 Encounter for antineoplastic radiation therapy: Secondary | ICD-10-CM | POA: Diagnosis not present

## 2019-02-04 DIAGNOSIS — C61 Malignant neoplasm of prostate: Secondary | ICD-10-CM | POA: Diagnosis not present

## 2019-02-05 DIAGNOSIS — C61 Malignant neoplasm of prostate: Secondary | ICD-10-CM | POA: Diagnosis not present

## 2019-02-05 DIAGNOSIS — Z510 Encounter for antineoplastic radiation therapy: Secondary | ICD-10-CM | POA: Diagnosis not present

## 2019-02-06 DIAGNOSIS — Z510 Encounter for antineoplastic radiation therapy: Secondary | ICD-10-CM | POA: Diagnosis not present

## 2019-02-06 DIAGNOSIS — C61 Malignant neoplasm of prostate: Secondary | ICD-10-CM | POA: Diagnosis not present

## 2019-02-07 DIAGNOSIS — C61 Malignant neoplasm of prostate: Secondary | ICD-10-CM | POA: Diagnosis not present

## 2019-02-07 DIAGNOSIS — Z510 Encounter for antineoplastic radiation therapy: Secondary | ICD-10-CM | POA: Diagnosis not present

## 2019-02-08 DIAGNOSIS — Z510 Encounter for antineoplastic radiation therapy: Secondary | ICD-10-CM | POA: Diagnosis not present

## 2019-02-08 DIAGNOSIS — C61 Malignant neoplasm of prostate: Secondary | ICD-10-CM | POA: Diagnosis not present

## 2019-02-11 DIAGNOSIS — C61 Malignant neoplasm of prostate: Secondary | ICD-10-CM | POA: Diagnosis not present

## 2019-02-11 DIAGNOSIS — Z510 Encounter for antineoplastic radiation therapy: Secondary | ICD-10-CM | POA: Diagnosis not present

## 2019-02-12 DIAGNOSIS — C61 Malignant neoplasm of prostate: Secondary | ICD-10-CM | POA: Diagnosis not present

## 2019-02-12 DIAGNOSIS — Z510 Encounter for antineoplastic radiation therapy: Secondary | ICD-10-CM | POA: Diagnosis not present

## 2019-02-18 ENCOUNTER — Encounter: Payer: Self-pay | Admitting: Cardiology

## 2019-02-18 ENCOUNTER — Ambulatory Visit: Payer: Medicare Other | Admitting: Cardiology

## 2019-02-18 ENCOUNTER — Other Ambulatory Visit: Payer: Self-pay

## 2019-02-18 VITALS — BP 115/54 | HR 69 | Temp 97.1°F | Ht 71.0 in | Wt 170.0 lb

## 2019-02-18 DIAGNOSIS — R42 Dizziness and giddiness: Secondary | ICD-10-CM | POA: Diagnosis not present

## 2019-02-18 DIAGNOSIS — E538 Deficiency of other specified B group vitamins: Secondary | ICD-10-CM | POA: Diagnosis not present

## 2019-02-18 DIAGNOSIS — E785 Hyperlipidemia, unspecified: Secondary | ICD-10-CM | POA: Diagnosis not present

## 2019-02-18 DIAGNOSIS — I951 Orthostatic hypotension: Secondary | ICD-10-CM | POA: Diagnosis not present

## 2019-02-18 DIAGNOSIS — D075 Carcinoma in situ of prostate: Secondary | ICD-10-CM | POA: Diagnosis not present

## 2019-02-18 DIAGNOSIS — D510 Vitamin B12 deficiency anemia due to intrinsic factor deficiency: Secondary | ICD-10-CM | POA: Diagnosis not present

## 2019-02-18 DIAGNOSIS — R001 Bradycardia, unspecified: Secondary | ICD-10-CM

## 2019-02-18 MED ORDER — FLUDROCORTISONE ACETATE 0.1 MG PO TABS: 0.1000 mg | ORAL_TABLET | Freq: Every day | ORAL | 3 refills | Status: DC

## 2019-02-18 NOTE — Patient Instructions (Signed)
Medication Instructions:  Start Florinef 0.1 mg daily   Labwork: none  Testing/Procedures: Your physician has recommended that you wear an event monitor. Event monitors are medical devices that record the heart's electrical activity. Doctors most often [place] these monitors to diagnose arrhythmias. Arrhythmias are problems with the speed or rhythm of the heartbeat. The monitor is a small, portable device. You can wear one while you do your normal daily activities. This is usually used to diagnose what is causing palpitations/syncope (passing out). 3 weeks    Follow-Up: Your physician recommends that you schedule a follow-up appointment in: 6 weeks    Any Other Special Instructions Will Be Listed Below (If Applicable).     If you need a refill on your cardiac medications before your next appointment, please call your pharmacy.

## 2019-02-18 NOTE — Progress Notes (Signed)
Clinical Summary Mr. Raska is a 85 y.o.male seen today for follow up of the following medical problems   1. Dizzy spells/Orthostatic hypotension -previously found to be severely orthostatic in clinic, SBP dropped 43 points with standing.   - ongoing symptoms - now on midodrine 10mg  tid from his pcp, on x 2 months - 3 episodes of syncope. Episode occurred while standing and bending over. Another episode at home while walking outside. Most recent episode was about 1 week ago after standing in the kitchen. Can feel dizzienss spells coming on. Not always a prodrome.   - drinks gatorade 0 large bottles x 2 bottles, diet sodas, water regularl    2. Prostate cancer - complete radiation, completed hormone therapy.    3. Bradycardia Mild sinus brady, chronic for several years.    Past Medical History:  Diagnosis Date  . Anemia   . Cancer [facility])    Prostate  . Diabetes mellitus without complication ([place])   . Hyperlipidemia      No Known Allergies   Current Outpatient Medications  Medication Sig Dispense Refill  . aspirin 81 MG tablet Take 81 mg by mouth daily.    Marland Kitchen atorvastatin (LIPITOR) 10 MG tablet Take 10 mg by mouth daily.    . calcium-vitamin D (OSCAL WITH D) 500-200 MG-UNIT tablet Take 3 tablets by mouth daily.    . ciprofloxacin (CIPRO) 500 MG tablet Take 500 mg by mouth 2 (two) times daily.    . cyanocobalamin (,VITAMIN B-12,) 1000 MCG/ML injection Inject 1,000 mcg into the muscle every 30 (thirty) days.    . metFORMIN (GLUCOPHAGE-XR) 500 MG 24 hr tablet Take 500 mg by mouth daily.  11  . midodrine (PROAMATINE) 2.5 MG tablet Take 5 mg by mouth daily.    . tamsulosin (FLOMAX) 0.4 MG CAPS capsule Take 0.4 mg by mouth daily.     No current facility-administered medications for this visit.      Past Surgical History:  Procedure Laterality Date  . COLONOSCOPY N/A 09/12/2012   Procedure: COLONOSCOPY;  Surgeon: Rogene Houston, MD;  Location[facility];   Service: Endoscopy;  Laterality: N/A;  915  . EYE SURGERY    . ORIF ANKLE FRACTURE Right   . YAG LASER APPLICATION Left 07/04/270   Procedure: YAG LASER APPLICATION;  Surgeon: Williams Che, MD;  Location: [facility] ORS;  Service: Ophthalmology;  Laterality: Left;     No Known Allergies    Family History  Problem Relation Age of Onset  . Cancer Mother   . Prostate cancer Brother      Social History Mr. Lenzen reports that he has quit smoking. He has never used smokeless tobacco. Mr. Tadesse reports no history of alcohol use.   Review of Systems CONSTITUTIONAL: No weight loss, fever, chills, weakness or fatigue.  HEENT: Eyes: No visual loss, blurred vision, double vision or yellow sclerae.No hearing loss, sneezing, congestion, runny nose or sore throat.  SKIN: No rash or itching.  CARDIOVASCULAR: per hpi RESPIRATORY: No shortness of breath, cough or sputum.  GASTROINTESTINAL: No anorexia, nausea, vomiting or diarrhea. No abdominal pain or blood.  GENITOURINARY: No burning on urination, no polyuria NEUROLOGICAL: per hpi MUSCULOSKELETAL: No muscle, back pain, joint pain or stiffness.  LYMPHATICS: No enlarged nodes. No history of splenectomy.  PSYCHIATRIC: No history of depression or anxiety.  ENDOCRINOLOGIC: No reports of sweating, cold or heat intolerance. No polyuria or polydipsia.  Marland Kitchen   Physical Examination Today's Vitals  02/18/19 0810  BP: (!) 115/54  Pulse: 69  Temp: (!) 97.1 F (36.2 C)  TempSrc: Temporal  SpO2: 94%  Weight: 170 lb (77.1 kg)  Height: 5\' 11"  (1.803 m)   Body mass index is 23.71 kg/m.;  Gen: resting comfortably, no acute distress HEENT: no scleral icterus, pupils equal round and reactive, no palptable cervical adenopathy,  CV: RRR, no mr/g, no jvd Resp: Clear to auscultation bilaterally GI: abdomen is soft, non-tender, non-distended, normal bowel sounds, no hepatosplenomegaly MSK: extremities are warm, no edema.  Skin: warm, no rash  Neuro:  no focal deficits Psych: appropriate affect   Diagnostic Studies 02/2018 echo Study Conclusions  - Left ventricle: The cavity size was normal. Systolic function was normal. The estimated ejection fraction was in the range of 60% to 65%. Wall motion was normal; there were no regional wall motion abnormalities. Left ventricular diastolic function parameters were normal. - Aortic valve: There was very mild stenosis. Valve area (Vmax): 2.29 cm^2. - Mitral valve: Moderately calcified annulus. Moderately thickened leaflets . - Left atrium: The atrium was mildly dilated. - Atrial septum: No defect or patent foramen ovale was identified.    Assessment and Plan  1. Dizziness/Orhotstatic hypotension - ongoing symptoms, pcp has titirated midodrine up to 10mg  tid - start florinef 0.1mg  daily  2. Sinus bradycardia - history of chronic sinus brady, prior EKGs to 40s and 50s - his syncope may be related to his orthostatic hypotension, however the lack of prodrome and prior bradycardia on EKGs raises concern at least about possible arrhythmia as etiology. We will obtain a 3 week event monitor.  - EKG today showssinus brady 58      [name], M.D.

## 2019-02-22 DIAGNOSIS — R309 Painful micturition, unspecified: Secondary | ICD-10-CM | POA: Diagnosis not present

## 2019-02-23 ENCOUNTER — Ambulatory Visit (INDEPENDENT_AMBULATORY_CARE_PROVIDER_SITE_OTHER): Payer: Medicare Other

## 2019-02-23 DIAGNOSIS — R42 Dizziness and giddiness: Secondary | ICD-10-CM

## 2019-02-25 DIAGNOSIS — N1832 Chronic kidney disease, stage 3b: Secondary | ICD-10-CM | POA: Diagnosis not present

## 2019-02-25 DIAGNOSIS — E1122 Type 2 diabetes mellitus with diabetic chronic kidney disease: Secondary | ICD-10-CM | POA: Diagnosis not present

## 2019-02-25 DIAGNOSIS — N390 Urinary tract infection, site not specified: Secondary | ICD-10-CM | POA: Diagnosis not present

## 2019-02-25 DIAGNOSIS — I951 Orthostatic hypotension: Secondary | ICD-10-CM | POA: Diagnosis not present

## 2019-03-01 ENCOUNTER — Other Ambulatory Visit: Payer: Self-pay

## 2019-03-01 ENCOUNTER — Emergency Department (HOSPITAL_COMMUNITY)
Admission: EM | Admit: 2019-03-01 | Discharge: 2019-03-02 | Disposition: A | Discharge status: Home / Self Care | Payer: Medicare Other | Attending: Emergency Medicine | Admitting: Emergency Medicine

## 2019-03-01 ENCOUNTER — Encounter (HOSPITAL_COMMUNITY): Payer: Self-pay

## 2019-03-01 DIAGNOSIS — Z8546 Personal history of malignant neoplasm of prostate: Secondary | ICD-10-CM | POA: Diagnosis not present

## 2019-03-01 DIAGNOSIS — E119 Type 2 diabetes mellitus without complications: Secondary | ICD-10-CM | POA: Diagnosis not present

## 2019-03-01 DIAGNOSIS — N390 Urinary tract infection, site not specified: Secondary | ICD-10-CM

## 2019-03-01 DIAGNOSIS — Z7984 Long term (current) use of oral hypoglycemic drugs: Secondary | ICD-10-CM | POA: Insufficient documentation

## 2019-03-01 DIAGNOSIS — R339 Retention of urine, unspecified: Secondary | ICD-10-CM | POA: Diagnosis present

## 2019-03-01 DIAGNOSIS — Z87891 Personal history of nicotine dependence: Secondary | ICD-10-CM | POA: Insufficient documentation

## 2019-03-01 DIAGNOSIS — Z79899 Other long term (current) drug therapy: Secondary | ICD-10-CM | POA: Insufficient documentation

## 2019-03-01 LAB — URINALYSIS, ROUTINE W REFLEX MICROSCOPIC
Bilirubin Urine: NEGATIVE
Glucose, UA: 50 mg/dL — AB
Ketones, ur: NEGATIVE mg/dL
Leukocytes,Ua: NEGATIVE
Nitrite: POSITIVE — AB
Protein, ur: 30 mg/dL — AB
Specific Gravity, Urine: 1.011 (ref 1.005–1.030)
WBC, UA: >50 WBC/hpf — ABNORMAL HIGH (ref 0–5)
pH: 5 (ref 5.0–8.0)

## 2019-03-01 MED ORDER — CEPHALEXIN 500 MG PO CAPS
500.0000 mg | ORAL_CAPSULE | Freq: Once | ORAL | Status: AC
  Administered 2019-03-02: 500 mg via ORAL
  Filled 2019-03-01: qty 1

## 2019-03-01 MED ORDER — CEPHALEXIN 500 MG PO CAPS: 500.0000 mg | ORAL_CAPSULE | Freq: Three times a day (TID) | ORAL | 0 refills | Status: DC

## 2019-03-01 NOTE — Discharge Instructions (Signed)
Follow-up with your PCP or urologist.

## 2019-03-01 NOTE — ED Triage Notes (Signed)
Pt has hx of prostate cancer and now is unable  to urinate.  Said he urinated just 10 min ago.

## 2019-03-04 LAB — URINE CULTURE: Culture: >=100000 — AB

## 2019-03-05 ENCOUNTER — Telehealth: Payer: Self-pay | Admitting: Emergency Medicine

## 2019-03-05 NOTE — Telephone Encounter (Signed)
Post ED Visit - Positive Culture Follow-up: Successful Patient Follow-Up  Culture assessed and recommendations reviewed by:  []  Elenor Quinones, Pharm.D. []  Heide Guile, Pharm.D., BCPS AQ-ID []  Parks Neptune, Pharm.D., BCPS []  Alycia Rossetti, Pharm.D., BCPS []  [place], Pharm.D., BCPS, AAHIVP []  Legrand Como, Pharm.D., BCPS, AAHIVP [x]  Salome Arnt, PharmD, BCPS []  Johnnette Gourd, PharmD, BCPS []  Hughes Better, PharmD, BCPS []  Leeroy Cha, PharmD  Positive urine culture  []  Patient discharged without antimicrobial prescription and treatment is now indicated [x]  Organism is resistant to prescribed ED discharge antimicrobial []  Patient with positive blood cultures  Changes discussed with ED provider: Carmon Sails PA New antibiotic prescription d/c cephalexin, start Bactrim DS 1 tab bid x 7 days Called to [place]  Contacted son   Hazle Nordmann 03/05/2019, 2:08 PM

## 2019-03-05 NOTE — Progress Notes (Signed)
ED Antimicrobial Stewardship Positive Culture Follow Up   Eric Brady is an 85 y.o. male who presented to [facility] on 03/01/2019 with a chief complaint of  Chief Complaint  Patient presents with  . Urinary Retention    Recent Results (from the past 720 hour(s))  Urine culture     Status: Abnormal   Collection Time: 03/01/19 10:44 PM   Specimen: Urine, Catheterized  Result Value Ref Range Status   Specimen Description   Final    URINE, CATHETERIZED Performed at [facility], [address] [place], [place] [address]    Special Requests   Final    NONE Performed at [facility], [address], [place], [place] [address]    Culture >=100,000 COLONIES/mL STAPHYLOCOCCUS EPIDERMIDIS (A)  Final   Report Status 03/04/2019 FINAL  Final   Organism ID, Bacteria STAPHYLOCOCCUS EPIDERMIDIS (A)  Final      Susceptibility   Staphylococcus epidermidis - MIC*    CIPROFLOXACIN >=8 RESISTANT Resistant     GENTAMICIN <=0.5 SENSITIVE Sensitive     NITROFURANTOIN <=16 SENSITIVE Sensitive     OXACILLIN >=4 RESISTANT Resistant     TETRACYCLINE 2 SENSITIVE Sensitive     VANCOMYCIN 2 SENSITIVE Sensitive     TRIMETH/SULFA 20 SENSITIVE Sensitive     CLINDAMYCIN <=0.25 SENSITIVE Sensitive     RIFAMPIN <=0.5 SENSITIVE Sensitive     Inducible Clindamycin NEGATIVE Sensitive     * >=100,000 COLONIES/mL STAPHYLOCOCCUS EPIDERMIDIS    [x]  Treated with cephalexin, organism resistant to prescribed antimicrobial []  Patient discharged originally without antimicrobial agent and treatment is now indicated  New antibiotic prescription: DC cephalexin, start bactrim DS 1 tablet PO BID x 7 days  ED Provider: Carmon Sails, PA   [name], [name] 03/05/2019, 9:15 AM Clinical Pharmacist Monday - Friday phone -  [phone] Saturday - Sunday phone - [phone]

## 2019-03-05 NOTE — ED Provider Notes (Signed)
[facility] EMERGENCY DEPARTMENT Provider Note   CSN: [id number] Arrival date & time: 03/01/19  2142     History   Chief Complaint Chief Complaint  Patient presents with  . Urinary Retention    HPI Eric Brady is a 85 y.o. male.     HPI   85 year old male with decreased urinary output.  He states the last day he has had decreased urinary output is only able to void a small amount at a time.  He feels like he needs to go frequently.  Some pelvic pressure.  No fevers or chills.  No hematuria.  Of note he recently had a Foley catheter placed.  Past Medical History:  Diagnosis Date  . Anemia   . Cancer [facility])    Prostate  . Diabetes mellitus without complication ([place])   . Hyperlipidemia     There are no active problems to display for this patient.   Past Surgical History:  Procedure Laterality Date  . COLONOSCOPY N/A 09/12/2012   Procedure: COLONOSCOPY;  Surgeon: Rogene Houston, MD;  Location: [facility] END[facility];  Service: Endoscopy;  Laterality: N/A;  915  . EYE SURGERY    . ORIF ANKLE FRACTURE Right   . YAG LASER APPLICATION Left 2/53/6644   Procedure: YAG LASER APPLICATION;  Surgeon: Williams Che, MD;  Location: [facility] ORS;  Service: Ophthalmology;  Laterality: Left;        Home Medications    Prior to Admission medications   Medication Sig Start Date End Date Taking? Authorizing Provider  atorvastatin (LIPITOR) 10 MG tablet Take 10 mg by mouth daily.   Yes [provider]  calcium-vitamin D (OSCAL WITH D) 500-200 MG-UNIT tablet Take 3 tablets by mouth daily.   Yes [provider]  ciprofloxacin (CIPRO) 500 MG tablet Take 500 mg by mouth 2 (two) times daily. 7 day course starting on 02/24/2019 02/24/19  Yes [provider]  cyanocobalamin (,VITAMIN B-12,) 1000 MCG/ML injection Inject 1,000 mcg into the muscle every 30 (thirty) days.   Yes [provider]  fludrocortisone (FLORINEF) 0.1 MG tablet Take 1 tablet (0.1 mg total)  by mouth daily. 02/18/19  Yes BranchAlphonse Guild, MD  metFORMIN (GLUCOPHAGE-XR) 500 MG 24 hr tablet Take 500 mg by mouth 2 (two) times daily.  01/28/15  Yes [provider]  midodrine (PROAMATINE) 10 MG tablet Take 10 mg by mouth 3 (three) times daily.   Yes [provider]  cephALEXin (KEFLEX) 500 MG capsule Take 1 capsule (500 mg total) by mouth 3 (three) times daily. 03/01/19   Virgel Manifold, MD  tamsulosin (FLOMAX) 0.4 MG CAPS capsule Take 0.4 mg by mouth daily.    [provider]    Family History Family History  Problem Relation Age of Onset  . Cancer Mother   . Prostate cancer Brother     Social History Social History   Tobacco Use  . Smoking status: Former Research scientist (life sciences)  . Smokeless tobacco: Never Used  Substance Use Topics  . Alcohol use: No  . Drug use: No     Allergies   Patient has no known allergies.   Review of Systems Review of Systems  All systems reviewed and negative, other than as noted in HPI.  Physical Exam Updated Vital Signs BP 98/67 (BP Location: Left Arm)   Pulse 70   Temp 98 F (36.7 C) (Oral)   Resp 17   Ht 5\' 11"  (1.803 m)   Wt 77.1 kg  SpO2 98%   BMI 23.71 kg/m   Physical Exam Vitals signs and nursing note reviewed.  Constitutional:      General: He is not in acute distress.    Appearance: He is well-developed.  HENT:     Head: Normocephalic and atraumatic.  Eyes:     General:        Right eye: No discharge.        Left eye: No discharge.     Conjunctiva/sclera: Conjunctivae normal.  Neck:     Musculoskeletal: Neck supple.  Cardiovascular:     Rate and Rhythm: Normal rate and regular rhythm.     Heart sounds: Normal heart sounds. No murmur. No friction rub. No gallop.   Pulmonary:     Effort: Pulmonary effort is normal. No respiratory distress.     Breath sounds: Normal breath sounds.  Abdominal:     General: There is no distension.     Palpations: Abdomen is soft.     Tenderness: There is no  abdominal tenderness.  Genitourinary:    Comments: Very mild suprapubic tenderness without rebound or guarding. Musculoskeletal:        General: No tenderness.  Skin:    General: Skin is warm and dry.  Neurological:     Mental Status: He is alert.  Psychiatric:        Behavior: Behavior normal.        Thought Content: Thought content normal.      ED Treatments / Results  Labs (all labs ordered are listed, but only abnormal results are displayed) Labs Reviewed  URINE CULTURE - Abnormal; Notable for the following components:      Result Value   Culture >=100,000 COLONIES/mL STAPHYLOCOCCUS EPIDERMIDIS (*)    Organism ID, Bacteria STAPHYLOCOCCUS EPIDERMIDIS (*)    All other components within normal limits  URINALYSIS, ROUTINE W REFLEX MICROSCOPIC - Abnormal; Notable for the following components:   Color, Urine AMBER (*)    APPearance HAZY (*)    Glucose, UA 50 (*)    Hgb urine dipstick SMALL (*)    Protein, ur 30 (*)    Nitrite POSITIVE (*)    WBC, UA >50 (*)    Bacteria, UA RARE (*)    All other components within normal limits    EKG None  Radiology No results found.  Procedures Procedures (including critical care time)  Medications Ordered in ED Medications  cephALEXin (KEFLEX) capsule 500 mg (500 mg Oral Given 03/02/19 0005)     Initial Impression / Assessment and Plan / ED Course  I have reviewed the triage vital signs and the nursing notes.  Pertinent labs & imaging results that were available during my care of the patient were reviewed by me and considered in my medical decision making (see chart for details).        I suspect the patient's symptoms are secondary to UTI not actual urinary retention.  His postvoid residual on bladder scan was approximately 200 cc.  Although he is not completely voiding I do not feel that he necessarily needs catheter placement again at this time.  I suspect and hope that symptoms will improve once he is treated with  antibiotics.  Return precautions were discussed.  Outpatient urology follow-up otherwise.  Final Clinical Impressions(s) / ED Diagnoses   Final diagnoses:  Lower urinary tract infectious disease    ED Discharge Orders         Ordered    cephALEXin (KEFLEX) 500 MG capsule  3 times daily     03/01/19 2343           Virgel Manifold, MD 03/05/19 1132

## 2019-03-20 DIAGNOSIS — C61 Malignant neoplasm of prostate: Secondary | ICD-10-CM | POA: Diagnosis not present

## 2019-03-22 ENCOUNTER — Other Ambulatory Visit: Payer: Self-pay

## 2019-03-22 DIAGNOSIS — E538 Deficiency of other specified B group vitamins: Secondary | ICD-10-CM | POA: Diagnosis not present

## 2019-04-03 ENCOUNTER — Encounter: Payer: Self-pay | Admitting: Cardiology

## 2019-04-03 ENCOUNTER — Ambulatory Visit (INDEPENDENT_AMBULATORY_CARE_PROVIDER_SITE_OTHER): Payer: Medicare Other | Admitting: Cardiology

## 2019-04-03 ENCOUNTER — Other Ambulatory Visit: Payer: Self-pay

## 2019-04-03 VITALS — BP 100/57 | HR 64 | Temp 96.8°F | Ht 71.0 in | Wt 179.0 lb

## 2019-04-03 DIAGNOSIS — R001 Bradycardia, unspecified: Secondary | ICD-10-CM

## 2019-04-03 DIAGNOSIS — I951 Orthostatic hypotension: Secondary | ICD-10-CM

## 2019-04-03 NOTE — Progress Notes (Signed)
Clinical Summary Mr. Jaquith is a 85 y.o.male  seen today for follow up of the following medicalproblems   1. Dizzy spells/Orthostatic hypotension -previouslyfound to be severely orthostatic in clinic, SBP dropped 43 points with standing.   - ongoing symptoms - now on midodrine 10mg  tid from his pcp, on x 2 months - 3 episodes of syncope. Episode occurred while standing and bending over. Another episode at home while walking outside. Most recent episode was about 1 week ago after standing in the kitchen. Can feel dizzienss spells coming on. Not always a prodrome.   - drinks gatorade 0 large bottles x 2 bottles, diet sodas, water regularl  - last visit we started florinef 0.1mg  daily  - no recurrent syncope since last visit -   2. Prostate cancer - complete radiation, completed hormone therapy.    3. Bradycardia Mild sinus brady, chronic for several years.  - 03/2019 monitor AV HR 52, reported symptoms correlated with SR and mild sinus brady   Past Medical History:  Diagnosis Date  . Anemia   . Cancer [facility])    Prostate  . Diabetes mellitus without complication ([place])   . Hyperlipidemia      No Known Allergies   Current Outpatient Medications  Medication Sig Dispense Refill  . atorvastatin (LIPITOR) 10 MG tablet Take 10 mg by mouth daily.    . calcium-vitamin D (OSCAL WITH D) 500-200 MG-UNIT tablet Take 3 tablets by mouth daily.    . cephALEXin (KEFLEX) 500 MG capsule Take 1 capsule (500 mg total) by mouth 3 (three) times daily. 15 capsule 0  . ciprofloxacin (CIPRO) 500 MG tablet Take 500 mg by mouth 2 (two) times daily. 7 day course starting on 02/24/2019    . cyanocobalamin (,VITAMIN B-12,) 1000 MCG/ML injection Inject 1,000 mcg into the muscle every 30 (thirty) days.    . fludrocortisone (FLORINEF) 0.1 MG tablet Take 1 tablet (0.1 mg total) by mouth daily. 90 tablet 3  . metFORMIN (GLUCOPHAGE-XR) 500 MG 24 hr tablet Take 500 mg by mouth 2 (two)  times daily.   11  . midodrine (PROAMATINE) 10 MG tablet Take 10 mg by mouth 3 (three) times daily.    . tamsulosin (FLOMAX) 0.4 MG CAPS capsule Take 0.4 mg by mouth daily.     No current facility-administered medications for this visit.     Past Surgical History:  Procedure Laterality Date  . COLONOSCOPY N/A 09/12/2012   Procedure: COLONOSCOPY;  Surgeon: Rogene Houston, MD;  [facility];  Service: Endoscopy;  Laterality: N/A;  915  . EYE SURGERY    . ORIF ANKLE FRACTURE Right   . YAG LASER APPLICATION Left 3/54/5625   Procedure: YAG LASER APPLICATION;  Surgeon: Williams Che, MD;  Location: [facility] ORS;  Service: Ophthalmology;  Laterality: Left;     No Known Allergies    Family History  Problem Relation Age of Onset  . Cancer Mother   . Prostate cancer Brother      Social History Mr. Ashmore reports that he has quit smoking. He has never used smokeless tobacco. Mr. Broadwell reports no history of alcohol use.   Review of Systems CONSTITUTIONAL: No weight loss, fever, chills, weakness or fatigue.  HEENT: Eyes: No visual loss, blurred vision, double vision or yellow sclerae.No hearing loss, sneezing, congestion, runny nose or sore throat.  SKIN: No rash or itching.  CARDIOVASCULAR: per hpi RESPIRATORY: No shortness of breath, cough or sputum.  GASTROINTESTINAL: No anorexia,  nausea, vomiting or diarrhea. No abdominal pain or blood.  GENITOURINARY: No burning on urination, no polyuria NEUROLOGICAL: No headache, dizziness, syncope, paralysis, ataxia, numbness or tingling in the extremities. No change in bowel or bladder control.  MUSCULOSKELETAL: No muscle, back pain, joint pain or stiffness.  LYMPHATICS: No enlarged nodes. No history of splenectomy.  PSYCHIATRIC: No history of depression or anxiety.  ENDOCRINOLOGIC: No reports of sweating, cold or heat intolerance. No polyuria or polydipsia.  Marland Kitchen   Physical Examination Today's Vitals   04/03/19 0837  BP:  (!) 100/57  Pulse: 64  Temp: (!) 96.8 F (36 C)  TempSrc: Temporal  SpO2: 93%  Weight: 179 lb (81.2 kg)  Height: 5\' 11"  (1.803 m)   Body mass index is 24.97 kg/m.  Gen: resting comfortably, no acute distress HEENT: no scleral icterus, pupils equal round and reactive, no palptable cervical adenopathy,  CV: RRR, 2/6 systolic murmur rusb, no jvd Resp: Clear to auscultation bilaterally GI: abdomen is soft, non-tender, non-distended, normal bowel sounds, no hepatosplenomegaly MSK: extremities are warm, no edema.  Skin: warm, no rash Neuro:  no focal deficits Psych: appropriate affect   Diagnostic Studies 02/2018 echo Study Conclusions  - Left ventricle: The cavity size was normal. Systolic function was normal. The estimated ejection fraction was in the range of 60% to 65%. Wall motion was normal; there were no regional wall motion abnormalities. Left ventricular diastolic function parameters were normal. - Aortic valve: There was very mild stenosis. Valve area (Vmax): 2.29 cm^2. - Mitral valve: Moderately calcified annulus. Moderately thickened leaflets . - Left atrium: The atrium was mildly dilated. - Atrial septum: No defect or patent foramen ovale was identified.     Assessment and Plan   1. Dizziness/Orhotstatic hypotension - symptoms have resolved on midodrine and florinef - will give Rx for compression stockings.   2. Sinus bradycardia - history of chronic sinus brady, prior EKGs to 40s and 50s - recent monitor average HR low 50s which is his baselien -continue to monitor, don't believe played a role in his prior syncopal episodes, looks to all have been related to his orthostatic hypotension  F/u 6 months     [name], M.D

## 2019-04-03 NOTE — Patient Instructions (Signed)
Medication Instructions:  Your physician recommends that you continue on your current medications as directed. Please refer to the Current Medication list given to you today.   Labwork: NONE  Testing/Procedures: NONE  Follow-Up: Your physician wants you to follow-up in: 6 MONTHS.  You will receive a reminder letter in the mail two months in advance. If you don't receive a letter, please call our office to schedule the follow-up appointment.   Any Other Special Instructions Will Be Listed Below (If Applicable).  YOU HAVE BEEN GIVEN A PRESCRIPTION FOR COMPRESSION STOCKINGS    If you need a refill on your cardiac medications before your next appointment, please call your pharmacy.

## 2019-04-24 DIAGNOSIS — E538 Deficiency of other specified B group vitamins: Secondary | ICD-10-CM | POA: Diagnosis not present

## 2019-05-01 ENCOUNTER — Telehealth: Payer: Self-pay | Admitting: Cardiology

## 2019-05-01 NOTE — Telephone Encounter (Signed)
Still having lightheadness an dizzy spells   And fell a couple days ago due to passing out  Would like to know if his meds need to be adjusted

## 2019-05-01 NOTE — Telephone Encounter (Signed)
Returned pt's sons call. He stated that the pt had got very dizzy and passed out a few days ago. He immediately came to once, as soon as he fell. He stated it was the same as all the other episodes. He has been drinking plenty of Gatorade and water. Son feels as if they may need to change some medications. Please advise.

## 2019-05-02 NOTE — Telephone Encounter (Signed)
Increase florinef to 0.2mg  daily, udpate [place] again in 1 week on symptoms   Zandra Abts MD

## 2019-05-03 MED ORDER — FLUDROCORTISONE ACETATE 0.1 MG PO TABS: 0.2000 mg | ORAL_TABLET | Freq: Every day | ORAL | 3 refills | Status: DC

## 2019-05-03 NOTE — Telephone Encounter (Signed)
Son notified of dose increase.he will call us back in 1 week with update on his father.

## 2019-05-22 DIAGNOSIS — Z5111 Encounter for antineoplastic chemotherapy: Secondary | ICD-10-CM | POA: Diagnosis not present

## 2019-05-22 DIAGNOSIS — C61 Malignant neoplasm of prostate: Secondary | ICD-10-CM | POA: Diagnosis not present

## 2019-05-24 DIAGNOSIS — D075 Carcinoma in situ of prostate: Secondary | ICD-10-CM | POA: Diagnosis not present

## 2019-05-24 DIAGNOSIS — N183 Chronic kidney disease, stage 3 unspecified: Secondary | ICD-10-CM | POA: Diagnosis not present

## 2019-05-24 DIAGNOSIS — E1129 Type 2 diabetes mellitus with other diabetic kidney complication: Secondary | ICD-10-CM | POA: Diagnosis not present

## 2019-05-24 DIAGNOSIS — I951 Orthostatic hypotension: Secondary | ICD-10-CM | POA: Diagnosis not present

## 2019-05-29 DIAGNOSIS — E538 Deficiency of other specified B group vitamins: Secondary | ICD-10-CM | POA: Diagnosis not present

## 2019-05-31 DIAGNOSIS — E785 Hyperlipidemia, unspecified: Secondary | ICD-10-CM | POA: Diagnosis not present

## 2019-05-31 DIAGNOSIS — E876 Hypokalemia: Secondary | ICD-10-CM | POA: Diagnosis not present

## 2019-05-31 DIAGNOSIS — N183 Chronic kidney disease, stage 3 unspecified: Secondary | ICD-10-CM | POA: Diagnosis not present

## 2019-05-31 DIAGNOSIS — E1122 Type 2 diabetes mellitus with diabetic chronic kidney disease: Secondary | ICD-10-CM | POA: Diagnosis not present

## 2019-06-17 DIAGNOSIS — R339 Retention of urine, unspecified: Secondary | ICD-10-CM | POA: Diagnosis not present

## 2019-06-24 DIAGNOSIS — R972 Elevated prostate specific antigen [PSA]: Secondary | ICD-10-CM | POA: Diagnosis not present

## 2019-06-24 DIAGNOSIS — C61 Malignant neoplasm of prostate: Secondary | ICD-10-CM | POA: Diagnosis not present

## 2019-06-24 DIAGNOSIS — Z8042 Family history of malignant neoplasm of prostate: Secondary | ICD-10-CM | POA: Diagnosis not present

## 2019-06-27 DIAGNOSIS — E538 Deficiency of other specified B group vitamins: Secondary | ICD-10-CM | POA: Diagnosis not present

## 2019-07-14 ENCOUNTER — Emergency Department (HOSPITAL_COMMUNITY): Payer: Medicare Other

## 2019-07-14 ENCOUNTER — Other Ambulatory Visit: Payer: Self-pay

## 2019-07-14 ENCOUNTER — Encounter (HOSPITAL_COMMUNITY): Payer: Self-pay | Admitting: Emergency Medicine

## 2019-07-14 ENCOUNTER — Emergency Department (HOSPITAL_COMMUNITY)
Admission: EM | Admit: 2019-07-14 | Discharge: 2019-07-14 | Disposition: A | Discharge status: Home / Self Care | Payer: Medicare Other | Attending: Emergency Medicine | Admitting: Emergency Medicine

## 2019-07-14 DIAGNOSIS — J9811 Atelectasis: Secondary | ICD-10-CM | POA: Diagnosis not present

## 2019-07-14 DIAGNOSIS — R778 Other specified abnormalities of plasma proteins: Secondary | ICD-10-CM

## 2019-07-14 DIAGNOSIS — M25511 Pain in right shoulder: Secondary | ICD-10-CM | POA: Diagnosis not present

## 2019-07-14 DIAGNOSIS — R7989 Other specified abnormal findings of blood chemistry: Secondary | ICD-10-CM | POA: Diagnosis not present

## 2019-07-14 DIAGNOSIS — Z87891 Personal history of nicotine dependence: Secondary | ICD-10-CM | POA: Diagnosis not present

## 2019-07-14 DIAGNOSIS — E119 Type 2 diabetes mellitus without complications: Secondary | ICD-10-CM | POA: Diagnosis not present

## 2019-07-14 DIAGNOSIS — R079 Chest pain, unspecified: Secondary | ICD-10-CM | POA: Diagnosis not present

## 2019-07-14 LAB — CBC WITH DIFFERENTIAL/PLATELET
Abs Immature Granulocytes: 0.03 10*3/uL (ref 0.00–0.07)
Basophils Absolute: 0.1 10*3/uL (ref 0.0–0.1)
Basophils Relative: 1 %
Eosinophils Absolute: 0.1 10*3/uL (ref 0.0–0.5)
Eosinophils Relative: 1 %
HCT: 34.6 % — ABNORMAL LOW (ref 39.0–52.0)
Hemoglobin: 10.8 g/dL — ABNORMAL LOW (ref 13.0–17.0)
Immature Granulocytes: 0 %
Lymphocytes Relative: 16 %
Lymphs Abs: 1.6 10*3/uL (ref 0.7–4.0)
MCH: 27.8 pg (ref 26.0–34.0)
MCHC: 31.2 g/dL (ref 30.0–36.0)
MCV: 89.2 fL (ref 80.0–100.0)
Monocytes Absolute: 0.8 10*3/uL (ref 0.1–1.0)
Monocytes Relative: 8 %
Neutro Abs: 7.3 10*3/uL (ref 1.7–7.7)
Neutrophils Relative %: 74 %
Platelets: 173 10*3/uL (ref 150–400)
RBC: 3.88 MIL/uL — ABNORMAL LOW (ref 4.22–5.81)
RDW: 13.4 % (ref 11.5–15.5)
WBC: 9.9 10*3/uL (ref 4.0–10.5)
nRBC: 0 % (ref 0.0–0.2)

## 2019-07-14 LAB — BASIC METABOLIC PANEL
Anion gap: 10 (ref 5–15)
BUN: 22 mg/dL (ref 8–23)
CO2: 27 mmol/L (ref 22–32)
Calcium: 9 mg/dL (ref 8.9–10.3)
Chloride: 103 mmol/L (ref 98–111)
Creatinine, Ser: 1.35 mg/dL — ABNORMAL HIGH (ref 0.61–1.24)
GFR calc Af Amer: 55 mL/min — ABNORMAL LOW (ref >60)
GFR calc non Af Amer: 48 mL/min — ABNORMAL LOW (ref >60)
Glucose, Bld: 134 mg/dL — ABNORMAL HIGH (ref 70–99)
Potassium: 3.1 mmol/L — ABNORMAL LOW (ref 3.5–5.1)
Sodium: 140 mmol/L (ref 135–145)

## 2019-07-14 LAB — TROPONIN I (HIGH SENSITIVITY)
Troponin I (High Sensitivity): 29 ng/L — ABNORMAL HIGH (ref <18)
Troponin I (High Sensitivity): 26 ng/L — ABNORMAL HIGH (ref <18)
Troponin I (High Sensitivity): 30 ng/L — ABNORMAL HIGH (ref <18)

## 2019-07-14 MED ORDER — DICLOFENAC SODIUM 1 % EX GEL: 2.0000 g | Freq: Four times a day (QID) | CUTANEOUS | 0 refills | Status: DC | PRN

## 2019-07-14 NOTE — ED Notes (Signed)
Lab requested to draw 3rd troponin.

## 2019-07-14 NOTE — Discharge Instructions (Signed)
You are seen in the emergency department today with right shoulder pain.  We did a work-up on your heart and your heart enzymes were slightly elevated.  They state fairly consistent throughout your emergency department stay.  While this could be muscles or bone pain I would like you to call your cardiologist first thing tomorrow morning.  If your shoulder pain gets worse in the night I would like you to call 911 and return to the emergency department immediately.

## 2019-07-14 NOTE — ED Provider Notes (Signed)
Emergency Department Provider Note   I have reviewed the triage vital signs and the nursing notes.   HISTORY  Chief Complaint Shoulder Pain   HPI Eric Brady is a 86 y.o. male with PMH of prostate cancer, HLD, and DM presents to the emergency department for evaluation of right shoulder pain present upon waking this morning.  He initially associated this pain with a fall last week but did not have pain initially after falling.  He told his son about the pain in the shoulder which is behind the right shoulder.  Its non-radiating but somewhat worse with movement.  He denies fevers.  No anterior chest pain or shortness of breath.  No chills.  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    Past Medical History:  Diagnosis Date  . Anemia   . Cancer [facility])    Prostate  . Diabetes mellitus without complication ([place])   . Hyperlipidemia     There are no problems to display for this patient.   Past Surgical History:  Procedure Laterality Date  . COLONOSCOPY N/A 09/12/2012   Procedure: COLONOSCOPY;  Surgeon: Rogene Houston, MD;  [facility];  Service: Endoscopy;  Laterality: N/A;  915  . EYE SURGERY    . ORIF ANKLE FRACTURE Right   . YAG LASER APPLICATION Left 10/08/2374   Procedure: YAG LASER APPLICATION;  Surgeon: Williams Che, MD;  Location: [facility] ORS;  Service: Ophthalmology;  Laterality: Left;    Allergies Patient has no known allergies.  Family History  Problem Relation Age of Onset  . Cancer Mother   . Prostate cancer Brother     Social History Social History   Tobacco Use  . Smoking status: Former Research scientist (life sciences)  . Smokeless tobacco: Never Used  Substance Use Topics  . Alcohol use: No  . Drug use: No    Review of Systems  Constitutional: No fever/chills Eyes: No visual changes. ENT: No sore throat. Cardiovascular: Denies chest pain. Respiratory: Denies shortness of breath. Gastrointestinal: No abdominal  pain.  No nausea, no vomiting.  No diarrhea.  No constipation. Genitourinary: Negative for dysuria. Musculoskeletal: Negative for back pain. Positive right shoulder pain.  Skin: Negative for rash. Neurological: Negative for headaches, focal weakness or numbness.  10-point ROS otherwise negative.  ____________________________________________   PHYSICAL EXAM:  VITAL SIGNS: ED Triage Vitals  Enc Vitals Group     BP 07/14/19 1544 (!) 163/85     Pulse Rate 07/14/19 1544 60     Resp 07/14/19 1544 18     Temp 07/14/19 1544 98.1 F (36.7 C)     Temp Source 07/14/19 1544 Oral     SpO2 07/14/19 1544 95 %     Weight 07/14/19 1548 180 lb (81.6 kg)     Height 07/14/19 1548 5\' 11"  (1.803 m)   Constitutional: Alert and oriented. Well appearing and in no acute distress. Eyes: Conjunctivae are normal. Head: Atraumatic. Nose: No congestion/rhinnorhea. Mouth/Throat: Mucous membranes are moist. Neck: No stridor.  Cardiovascular: Normal rate, regular rhythm. Good peripheral circulation. Grossly normal heart sounds.   Respiratory: Normal respiratory effort.  No retractions. Lungs CTAB. Gastrointestinal: Soft and nontender. No distention.  Musculoskeletal: No lower extremity tenderness nor edema. No gross deformities of extremities.  No tenderness to palpation of the posterior right shoulder.  Patient does have some discomfort with abduction and external rotation of the right shoulder which reproduces his pain.  Neurologic:  Normal speech and  language. No gross focal neurologic deficits are appreciated.  Skin:  Skin is warm, dry and intact. No rash noted.   ____________________________________________   LABS (all labs ordered are listed, but only abnormal results are displayed)  Labs Reviewed  BASIC METABOLIC PANEL - Abnormal; Notable for the following components:      Result Value   Potassium 3.1 (*)    Glucose, Bld 134 (*)    Creatinine, Ser 1.35 (*)    GFR calc non Af Amer 48 (*)     GFR calc Af Amer 55 (*)    All other components within normal limits  CBC WITH DIFFERENTIAL/PLATELET - Abnormal; Notable for the following components:   RBC 3.88 (*)    Hemoglobin 10.8 (*)    HCT 34.6 (*)    All other components within normal limits  TROPONIN I (HIGH SENSITIVITY) - Abnormal; Notable for the following components:   Troponin I (High Sensitivity) 29 (*)    All other components within normal limits  TROPONIN I (HIGH SENSITIVITY) - Abnormal; Notable for the following components:   Troponin I (High Sensitivity) 26 (*)    All other components within normal limits  TROPONIN I (HIGH SENSITIVITY) - Abnormal; Notable for the following components:   Troponin I (High Sensitivity) 30 (*)    All other components within normal limits   ____________________________________________  EKG   EKG Interpretation  Date/Time:  Sunday July 14 2019 15:56:10 EDT Ventricular Rate:  53 PR Interval:    QRS Duration: 101 QT Interval:  495 QTC Calculation: 465 R Axis:   78 Text Interpretation: Sinus rhythm Borderline ST depression, anterolateral leads No STEMI. Similar to prior. Confirmed by Nanda Quinton [phone]) on 07/14/2019 4:37:50 PM       ____________________________________________  RADIOLOGY  DG Chest 2 View  Result Date: 07/14/2019 CLINICAL DATA:  86 year old male with chest pain. EXAM: CHEST - 2 VIEW COMPARISON:  Chest radiograph dated 11/10/2013. FINDINGS: The lungs are clear. There is no pleural effusion. Faint lucency along the peripheral aspect of the lateral right lower lung may be artifactual and related to skin fold. A small pneumothorax is not excluded. Clinical correlation is recommended. The cardiac silhouette is within normal limits. Atherosclerotic calcification of the aorta. Degenerative changes of the spine. No acute osseous pathology. IMPRESSION: Artifact versus less likely a small right pneumothorax. Repeat radiograph may provide better evaluation. No other acute  findings. Electronically Signed   By: Anner Crete M.D.   On: 07/14/2019 17:20   DG Shoulder Right  Result Date: 07/14/2019 CLINICAL DATA:  86 year old male with chest and right shoulder pain. EXAM: RIGHT SHOULDER - 2+ VIEW COMPARISON:  Right shoulder radiograph dated 11/17/2009. FINDINGS: There is no acute fracture or dislocation. Mild degenerative changes of the right shoulder and right AC joint with spurring. There is slight elevation of the right humeral head likely related to chronic rotator cuff injury. The soft tissues are unremarkable. IMPRESSION: No acute fracture or dislocation. Electronically Signed   By: Anner Crete M.D.   On: 07/14/2019 17:18   CT Chest Wo Contrast  Result Date: 07/14/2019 CLINICAL DATA:  Right-sided shoulder pain. EXAM: CT CHEST WITHOUT CONTRAST TECHNIQUE: Multidetector CT imaging of the chest was performed following the standard protocol without IV contrast. COMPARISON:  None. FINDINGS: Cardiovascular: There is marked severity calcification of the aortic arch. Normal heart size. No pericardial effusion. Mediastinum/Nodes: No enlarged mediastinal or axillary lymph nodes. Thyroid gland, trachea, and esophagus demonstrate no significant findings. Lungs/Pleura: Mild atelectasis and/or  infiltrate is seen within the posterior aspect of the right lung base. There is no evidence of a pleural effusion or pneumothorax. Upper Abdomen: No acute abnormality. Musculoskeletal: Multilevel degenerative changes are seen throughout the thoracic spine. IMPRESSION: Mild right basilar atelectasis and/or infiltrate. Aortic Atherosclerosis (ICD10-I70.0). Electronically Signed   By: Virgina Norfolk M.D.   On: 07/14/2019 20:26    ____________________________________________   PROCEDURES  Procedure(s) performed:   Procedures  None  ____________________________________________   INITIAL IMPRESSION / ASSESSMENT AND PLAN / ED COURSE  Pertinent labs & imaging results that were  available during my care of the patient were reviewed by me and considered in my medical decision making (see chart for details).   Patient presents with pain to the posterior right shoulder starting this morning upon waking.  He is overall well-appearing with somewhat reproducible pain in the right shoulder.  Given the patient's age and risk factors I will also obtain an ACS work-up as I am unable to reproduce the pain on palpation with movement of the shoulder.  No suspicion for septic joint.  Son at bedside to provide additional history which is helpful.   Patient's initial troponin of 29 then to 26.  Patient's blood pressure improved spontaneously.  Doubt hypertensive emergency.  Dian Situ an additional troponin which came back at 30.  No significant uptrending biomarkers.  Chest x-ray with questionable tiny pneumothorax.  I obtained a noncontrast CT scan of the chest for further evaluation of this which was not seen on CT.  No concern clinically for PE.  The patient's pain does have a reproducible quality with movement of the shoulder and I suspect it is musculoskeletal.  I had a Pattie Flaharty discussion with the patient and son at bedside and given his age, risk factors, slightly elevated troponins that I would prefer to keep him in the hospital overnight. He is very resistant to admission to the hospital. Given that his blood pressure has improved and he is feeling improved he would like to be discharged home. We discussed that he should call his cardiologist, Dr. Harl Bowie, first thing tomorrow morning to schedule a follow-up appointment and he will return immediately if he develops any new or worsening shoulder pain, chest pain, shortness of breath, other severe symptoms. ____________________________________________  FINAL CLINICAL IMPRESSION(S) / ED DIAGNOSES  Final diagnoses:  Acute pain of right shoulder  Elevated troponin    NEW OUTPATIENT MEDICATIONS STARTED DURING THIS VISIT:  Discharge Medication List  as of 07/14/2019 10:31 PM    START taking these medications   Details  diclofenac Sodium (VOLTAREN) 1 % GEL Apply 2 g topically 4 (four) times daily as needed., Starting Sun 07/14/2019, Normal        Note:  This document was prepared using Dragon voice recognition software and may include unintentional dictation errors.  Nanda Quinton, MD, [facility] Emergency Medicine    Jamerson Vonbargen, [facility], MD 07/15/19 [phone]

## 2019-07-14 NOTE — ED Triage Notes (Signed)
Patient reports rt side shoulder pain that began this morning which is progressively getting worse. 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

## 2019-07-14 NOTE — ED Triage Notes (Signed)
Pt reports R sided chest pain   Does not radiate   Pt is [facility]

## 2019-07-17 ENCOUNTER — Ambulatory Visit: Payer: Medicare Other | Admitting: Student

## 2019-07-17 ENCOUNTER — Encounter: Payer: Self-pay | Admitting: Student

## 2019-07-17 ENCOUNTER — Other Ambulatory Visit: Payer: Self-pay

## 2019-07-17 VITALS — BP 182/88 | HR 61 | Temp 97.3°F | Ht 71.0 in | Wt 177.8 lb

## 2019-07-17 DIAGNOSIS — R001 Bradycardia, unspecified: Secondary | ICD-10-CM

## 2019-07-17 DIAGNOSIS — I951 Orthostatic hypotension: Secondary | ICD-10-CM | POA: Diagnosis not present

## 2019-07-17 DIAGNOSIS — E785 Hyperlipidemia, unspecified: Secondary | ICD-10-CM | POA: Diagnosis not present

## 2019-07-17 DIAGNOSIS — M25511 Pain in right shoulder: Secondary | ICD-10-CM | POA: Diagnosis not present

## 2019-07-17 DIAGNOSIS — N1831 Chronic kidney disease, stage 3a: Secondary | ICD-10-CM

## 2019-07-17 MED ORDER — FLUDROCORTISONE ACETATE 0.1 MG PO TABS: 0.1000 mg | ORAL_TABLET | Freq: Every day | ORAL | 3 refills | Status: DC

## 2019-07-17 NOTE — Progress Notes (Signed)
Cardiology Office Note    Date:  07/17/2019   ID:  Eric Brady, DOB April 10, 1932, MRN [id number]  PCP:  Asencion Noble, MD  Cardiologist: Carlyle Dolly, MD    Chief Complaint  Patient presents with  . Follow-up    recent Emergency Dept visit    History of Present Illness:    Eric Brady is a 86 y.o. male with past medical history of orthostatic hypotension, chronic bradycardia (prior monitor in 02/2019 showing sinus bradycardia but no significant pauses or high-grade AV block), Type 2 DM, HLD, Stage 3 CKD and history of prostate cancer who presents to the office today for follow-up from a recent Emergency Department visit.   He was last examined by Dr.Branch in 03/2019 and was on Midodrine 10mg  TID and Florinef 0.1mg  daily for this orthostasis and denied any recurrent syncope since being started on Florinef. He was continued on his current medication regimen and encouraged to utilize compression stockings. He did call the office in 04/2019 for recurrent dizziness and a syncopal episode, therefore Florinef was increased to 0.2mg  daily.   He most recently presented to [facility] ED on 07/14/2019 for evaluation of right shoulder pain. He reported a fall the week prior but did not have pain initially. Imaging showed no acute fracture or dislocation. His pain was reproducible with palpation and thought to be most consistent with MSK discomfort. Given his cardiac risk factors, HS Troponin values were obtained and flat at 29, 26 and 30. EKG showed sinus bradycardia, HR 53 with nonspecific ST abnormality along anterolateral leads. CXR showed artifact versus a small right pneumothorax and further imaging was recommended. CT Chest showed mild right basilar atelectasis and/or infiltrate with aortic atherosclerosis. He was discharged home and informed to follow-up with Cardiology in the outpatient setting.   In talking with the patient and his son today, he reports still having intermittent  episodes of discomfort along his right shoulder which is worse with positional changes of his right arm. There is no association of his pain with exertion. He is tender to palpation along his upper trapezius. He denies any recent dyspnea on exertion, orthopnea, PND or lower extremity edema.  He has been checking his blood pressure at home and reports it has overall been well controlled but was elevated at the time of ED evaluation. Was elevated at 190/88 on most recent check today and still at 182/88 on repeat. He denies any associated headaches or vision changes.  Past Medical History:  Diagnosis Date  . Anemia   . Cancer [facility])    Prostate  . Diabetes mellitus without complication ([place])   . Hyperlipidemia     Past Surgical History:  Procedure Laterality Date  . COLONOSCOPY N/A 09/12/2012   Procedure: COLONOSCOPY;  Surgeon: Rogene Houston, MD;  Location: [facility] ENDO SUITE;  Service: Endoscopy;  Laterality: N/A;  915  . EYE SURGERY    . ORIF ANKLE FRACTURE Right   . YAG LASER APPLICATION Left 9/76/7341   Procedure: YAG LASER APPLICATION;  Surgeon: Williams Che, MD;  Location: [facility] ORS;  Service: Ophthalmology;  Laterality: Left;    Current Medications: Outpatient Medications Prior to Visit  Medication Sig Dispense Refill  . atorvastatin (LIPITOR) 10 MG tablet Take 10 mg by mouth at bedtime.     . calcium-vitamin D (OSCAL WITH D) 500-200 MG-UNIT tablet Take 2 tablets by mouth daily.     . cyanocobalamin (,VITAMIN B-12,) 1000 MCG/ML injection Inject 1,000 mcg into the muscle  every 30 (thirty) days.    . diclofenac Sodium (VOLTAREN) 1 % GEL Apply 2 g topically 4 (four) times daily as needed. 50 g 0  . metFORMIN (GLUCOPHAGE-XR) 500 MG 24 hr tablet Take 1,000 mg by mouth at bedtime.   11  . midodrine (PROAMATINE) 10 MG tablet Take 10 mg by mouth 3 (three) times daily.    . tamsulosin (FLOMAX) 0.4 MG CAPS capsule Take 0.4 mg by mouth daily.    . fludrocortisone (FLORINEF) 0.1 MG tablet Take 2  tablets (0.2 mg total) by mouth daily. 180 tablet 3   No facility-administered medications prior to visit.     Allergies:   Patient has no known allergies.   Social History   Socioeconomic History  . Marital status: Married    Spouse name: Not on file  . Number of children: Not on file  . Years of education: Not on file  . Highest education level: Not on file  Occupational History  . Not on file  Tobacco Use  . Smoking status: Former Research scientist (life sciences)  . Smokeless tobacco: Never Used  Substance and Sexual Activity  . Alcohol use: No  . Drug use: No  . Sexual activity: Not on file  Other Topics Concern  . Not on file  Social History Narrative  . Not on file   Social Determinants of Health   Financial Resource Strain:   . Difficulty of Paying Living Expenses:   Food Insecurity:   . Worried About Charity fundraiser in the Last Year:   . Arboriculturist in the Last Year:   Transportation Needs:   . Film/video editor (Medical):   Marland Kitchen Lack of Transportation (Non-Medical):   Physical Activity:   . Days of Exercise per Week:   . Minutes of Exercise per Session:   Stress:   . Feeling of Stress :   Social Connections:   . Frequency of Communication with Friends and Family:   . Frequency of Social Gatherings with Friends and Family:   . Attends Religious Services:   . Active Member of Clubs or Organizations:   . Attends Archivist Meetings:   Marland Kitchen Marital Status:      Family History:  The patient's family history includes Cancer in his mother; Prostate cancer in his brother.   Review of Systems:   Please see the history of present illness.     General:  No chills, fever, night sweats or weight changes.  Cardiovascular:  No chest pain, dyspnea on exertion, edema, orthopnea, palpitations, paroxysmal nocturnal dyspnea.  Dermatological: No rash, lesions/masses Respiratory: No cough, dyspnea Urologic: No hematuria, dysuria MSK: Positive for right shoulder pain.    Abdominal:   No nausea, vomiting, diarrhea, bright red blood per rectum, melena, or hematemesis Neurologic:  No visual changes, wkns, changes in mental status. All other systems reviewed and are otherwise negative except as noted above.   Physical Exam:    VS:  BP (!) 182/88   Pulse 61   Temp (!) 97.3 F (36.3 C)   Ht 5\' 11"  (1.803 m)   Wt 177 lb 12.8 oz (80.6 kg)   SpO2 97%   BMI 24.80 kg/m    General: Well developed, elderly male appearing in no acute distress. Head: Normocephalic, atraumatic, sclera non-icteric.  Neck: No carotid bruits. JVD not elevated.  Lungs: Respirations regular and unlabored, without wheezes or rales.  Heart: Regular rate and rhythm. No S3 or S4.  No murmur, no rubs,  or gallops appreciated. Abdomen: Soft, non-tender, non-distended. No obvious abdominal masses. Msk:  Strength and tone appear normal for age. No obvious joint deformities or effusions.Tender to palpation along right trapezius.  Extremities: No clubbing or cyanosis. No lower extremity edema. Distal pedal pulses are 2+ bilaterally. Neuro: Alert and oriented X 3. Moves all extremities spontaneously. No focal deficits noted. Psych:  Responds to questions appropriately with a normal affect. Skin: No rashes or lesions noted  Wt Readings from Last 3 Encounters:  07/17/19 177 lb 12.8 oz (80.6 kg)  07/14/19 180 lb (81.6 kg)  04/03/19 179 lb (81.2 kg)      Studies/Labs Reviewed:   EKG:  EKG is not ordered today. EKG from 07/14/2019 is reviewed and shows sinus bradycardia, HR 53 with nonspecific ST abnormality along anterolateral leads which is similar to prior tracings.   Recent Labs: 01/05/2019: ALT 36 07/14/2019: BUN 22; Creatinine, Ser 1.35; Hemoglobin 10.8; Platelets 173; Potassium 3.1; Sodium 140   Lipid Panel No results found for: CHOL, TRIG, HDL, CHOLHDL, VLDL, LDLCALC, LDLDIRECT  Additional studies/ records that were reviewed today include:   Echocardiogram: 02/2018 Study  Conclusions   - Left ventricle: The cavity size was normal. Systolic function was  normal. The estimated ejection fraction was in the range of 60%  to 65%. Wall motion was normal; there were no regional wall  motion abnormalities. Left ventricular diastolic function  parameters were normal.  - Aortic valve: There was very mild stenosis. Valve area (Vmax):  2.29 cm^2.  - Mitral valve: Moderately calcified annulus. Moderately thickened  leaflets .  - Left atrium: The atrium was mildly dilated.  - Atrial septum: No defect or patent foramen ovale was identified.   Event Monitor: 02/2019  14 day holter monitor  Min HR 40, Max HR 96, Avg HR 52  Rare supraventricular ectopy in the form of isolated PACs and couplets  Rare ventricular ectopy in the form of isolated PVCs, couplets, trigeminy  Predominant rhythm was sinus bradycardia. No significant arrhythmias  Reported symptoms correlated with sinus rhythm and mild sinus bradycardia    Assessment:    1. Orthostatic hypotension   2. Bradycardia   3. Right shoulder pain, unspecified chronicity   4. Hyperlipidemia LDL goal <70   5. Stage 3a chronic kidney disease      Plan:   In order of problems listed above:  1. Orthostatic Hypotension - He has a longstanding history of orthostatic hypotension and has been on Midodrine 10mg  TID and Florinef 0.2mg  daily for his symptoms. Over the past few weeks, his resting BP has been elevated and was at 182/88 on recheck today. I reviewed with the patient and his son that we will gradually reduce Florinef back to 0.1 mg daily and they will continue to follow BP at home. A BP log was provided. If resting hypertension persists despite dose reduction of Florinef, then might ultimately need to be discontinued with a focus on continuing Midodrine along with conservative measures including compression stockings and possible abdominal binder.  2. Sinus Bradycardia - prior monitor in 02/2019  showed sinus bradycardia but no significant pauses or high-grade AV block. He denies any recent dizziness or presyncope. Continue to avoid AV nodal blocking agents.  3. Right Shoulder Pain - Was recently evaluated for symptoms in the emergency department as outlined above. HS troponin values remained flat. His pain overall seems most consistent with musculoskeletal discomfort on today's examination as it changes with movement of his right arm and is reproducible  on palpation. I did encourage him to follow-up with his PCP to see if follow-up with Orthopedics was warranted and to make [place] aware if he develops any exertional symptoms.   4. HLD - followed by PCP. FLP in 05/2019 showed total cholesterol 130, triglycerides 103, HDL 56 and LDL 55. He remains on Atorvastatin 10 mg daily.  5. Stage 3 CKD - creatinine was elevated to 1.35 by recent labs on 07/14/2019 which is similar to prior readings in 05/2019 as creatinine was at 1.39 then. He was hypokalemic by recent labs with K+ at 3.1. He is scheduled for repeat labs with his PCP in the coming weeks and I recommend he have a repeat BMET at that time as he may require potassium supplementation. We did review the importance of consuming K+ rich foods.    Medication Adjustments/Labs and Tests Ordered: Current medicines are reviewed at length with the patient today.  Concerns regarding medicines are outlined above.  Medication changes, Labs and Tests ordered today are listed in the Patient Instructions below. Patient Instructions  Medication Instructions:  Your physician has recommended you make the following change in your medication:  Decrease Florinef to 0.1 mg Daily   *If you need a refill on your cardiac medications before your next appointment, please call your pharmacy*   Lab Work: NONE   If you have labs (blood work) drawn today and your tests are completely normal, you will receive your results only by: Marland Kitchen MyChart Message (if you have  MyChart) OR . A paper copy in the mail If you have any lab test that is abnormal or we need to change your treatment, we will call you to review the results.   Testing/Procedures: NONE    Follow-Up: At [facility], you and your health needs are our priority.  As part of our continuing mission to provide you with exceptional heart care, we have created designated Provider Care Teams.  These Care Teams include your primary Cardiologist (physician) and Advanced Practice Providers (APPs -  Physician Assistants and Nurse Practitioners) who all work together to provide you with the care you need, when you need it.  We recommend signing up for the patient portal called "MyChart".  Sign up information is provided on this After Visit Summary.  MyChart is used to connect with patients for Virtual Visits (Telemedicine).  Patients are able to view lab/test results, encounter notes, upcoming appointments, etc.  Non-urgent messages can be sent to your provider as well.   To learn more about what you can do with MyChart, go to [email or web address].    Your next appointment:    Keep follow with Dr. Harl Bowie in June   The format for your next appointment:   In Person  Provider:   Carlyle Dolly, MD   Other Instructions Thank you for choosing [facility]!       Signed, Erma Heritage, PA-C  07/17/2019 4:35 PM    [facility] [address] [place], [place] [address] Phone: [phone] Fax: [phone]

## 2019-07-17 NOTE — Patient Instructions (Signed)
Medication Instructions:  Your physician has recommended you make the following change in your medication:  Decrease Florinef to 0.1 mg Daily   *If you need a refill on your cardiac medications before your next appointment, please call your pharmacy*   Lab Work: NONE   If you have labs (blood work) drawn today and your tests are completely normal, you will receive your results only by: Marland Kitchen MyChart Message (if you have MyChart) OR . A paper copy in the mail If you have any lab test that is abnormal or we need to change your treatment, we will call you to review the results.   Testing/Procedures: NONE    Follow-Up: At [facility], you and your health needs are our priority.  As part of our continuing mission to provide you with exceptional heart care, we have created designated Provider Care Teams.  These Care Teams include your primary Cardiologist (physician) and Advanced Practice Providers (APPs -  Physician Assistants and Nurse Practitioners) who all work together to provide you with the care you need, when you need it.  We recommend signing up for the patient portal called "MyChart".  Sign up information is provided on this After Visit Summary.  MyChart is used to connect with patients for Virtual Visits (Telemedicine).  Patients are able to view lab/test results, encounter notes, upcoming appointments, etc.  Non-urgent messages can be sent to your provider as well.   To learn more about what you can do with MyChart, go to [email or web address].    Your next appointment:    Keep follow with Dr. Harl Bowie in June   The format for your next appointment:   In Person  Provider:   Carlyle Dolly, MD   Other Instructions Thank you for choosing [facility]!

## 2019-07-29 DIAGNOSIS — E538 Deficiency of other specified B group vitamins: Secondary | ICD-10-CM | POA: Diagnosis not present

## 2019-08-12 ENCOUNTER — Encounter: Payer: Self-pay | Admitting: Dermatology

## 2019-08-12 ENCOUNTER — Other Ambulatory Visit: Payer: Self-pay

## 2019-08-12 ENCOUNTER — Ambulatory Visit: Payer: Medicare Other | Admitting: Dermatology

## 2019-08-12 DIAGNOSIS — D485 Neoplasm of uncertain behavior of skin: Secondary | ICD-10-CM

## 2019-08-12 DIAGNOSIS — S80862A Insect bite (nonvenomous), left lower leg, initial encounter: Secondary | ICD-10-CM | POA: Diagnosis not present

## 2019-08-12 DIAGNOSIS — L729 Follicular cyst of the skin and subcutaneous tissue, unspecified: Secondary | ICD-10-CM | POA: Diagnosis not present

## 2019-08-12 DIAGNOSIS — L821 Other seborrheic keratosis: Secondary | ICD-10-CM | POA: Diagnosis not present

## 2019-08-12 DIAGNOSIS — W57XXXA Bitten or stung by nonvenomous insect and other nonvenomous arthropods, initial encounter: Secondary | ICD-10-CM

## 2019-08-12 DIAGNOSIS — Z85828 Personal history of other malignant neoplasm of skin: Secondary | ICD-10-CM

## 2019-08-12 DIAGNOSIS — L570 Actinic keratosis: Secondary | ICD-10-CM | POA: Diagnosis not present

## 2019-08-12 DIAGNOSIS — D0421 Carcinoma in situ of skin of right ear and external auricular canal: Secondary | ICD-10-CM | POA: Diagnosis not present

## 2019-08-12 DIAGNOSIS — C4492 Squamous cell carcinoma of skin, unspecified: Secondary | ICD-10-CM

## 2019-08-12 HISTORY — DX: Squamous cell carcinoma of skin, unspecified: C44.92

## 2019-08-12 NOTE — Progress Notes (Signed)
With daughter Eric Brady  Follow-Up Visit   Subjective  Eric Brady is a 86 y.o. male who presents for the following: Annual Exam (Here this morning because he has a couple of places that he is concerned about. Has a crusty place on his right ear rim. Nothing with any itching, bleeding or flaking. Just finished radiation treatment for Prostate cancer. Daughter with him in the room this morning.).  New growth Location: Right ear Duration:  Quality:  Associated Signs/Symptoms: Modifying Factors:  Severity:  Timing: Context: History of normal skin cancer  The following portions of the chart were reviewed this encounter and updated as appropriate: Tobacco  Allergies  Meds  Problems  Med Hx  Surg Hx  Fam Hx      Objective  Well appearing patient in no apparent distress; mood and affect are within normal limits.  All skin waist up examined. Plus legs.   Assessment & Plan  AK (actinic keratosis) (5) Left Forearm - Posterior  Destruction of lesion - Left Forearm - Posterior  Destruction method: cryotherapy   Informed consent: discussed and consent obtained   Timeout:  patient name, date of birth, surgical site, and procedure verified Lesion destroyed using liquid nitrogen: Yes   Region frozen until ice ball extended beyond lesion: Yes   Cryotherapy cycles:  5 Outcome: patient tolerated procedure well with no complications    Cyst of skin Mid Back  Safe to leave  Insect bite of left lower leg, initial encounter Left Lower Leg - Anterior  Over-the-counter antipruritic  Neoplasm of uncertain behavior of skin Right ear rim  Skin / nail biopsy Type of biopsy: tangential   Informed consent: discussed and consent obtained   Timeout: patient name, date of birth, surgical site, and procedure verified   Procedure prep:  Patient was prepped and draped in usual sterile fashion Prep type:  Chlorhexidine Anesthesia: the lesion was anesthetized in a standard fashion     Anesthetic:  1% lidocaine w/ epinephrine 1-100,000 local infiltration Instrument used: flexible razor blade   Hemostasis achieved with: ferric subsulfate   Outcome: patient tolerated procedure well   Post-procedure details: wound care instructions given    Specimen 1 - Surgical pathology Differential Diagnosis: r/o bcc scc  Check Margins: No First follow-up since 2019 for Eric Brady; his daughter Eric Brady is with him in the room.  He has recently undergone radiation therapy along with hormone therapy for prostate cancer and he is lacking energy.  He is pale appearing man (he is scheduled to check CBC in a few days).  On the rim of his right ear two thirds up is a thick 1 cm crust that may be a superficial carcinoma; shave biopsy obtained and then get the results in 2 to 3 days on MyChart or by calling my office.  Thick precancers on the left arm were treated with freezing; they understand these areas may swell and peel.  Multiple benign keratoses including many around the eyes on the upper back require no intervention.  A 2 cm subcutaneous soft cyst in the middle of the back has been present" forever" and requires no intervention.  Multiple bites on his shins from either chiggers or possibly ticks are all healing.

## 2019-08-12 NOTE — Patient Instructions (Signed)

## 2019-08-16 ENCOUNTER — Telehealth: Payer: Self-pay

## 2019-08-16 NOTE — Telephone Encounter (Signed)
Daughter is on phone to get his results

## 2019-08-16 NOTE — Telephone Encounter (Signed)
Pathology results to patient's daughter, Larene Beach, on [place]. 30 minute with ST on June 10th.

## 2019-08-16 NOTE — Telephone Encounter (Signed)
-----   Message from [name], MD sent at 08/15/2019  9:47 PM EDT ----- Schedule surgery with Dr. Darene Lamer

## 2019-08-16 NOTE — Telephone Encounter (Signed)
Left voicemail for patient to call for pathology results.

## 2019-08-19 DIAGNOSIS — Z0120 Encounter for dental examination and cleaning without abnormal findings: Secondary | ICD-10-CM | POA: Diagnosis not present

## 2019-08-20 ENCOUNTER — Encounter: Payer: Self-pay | Admitting: Dermatology

## 2019-08-22 DIAGNOSIS — N183 Chronic kidney disease, stage 3 unspecified: Secondary | ICD-10-CM | POA: Diagnosis not present

## 2019-08-22 DIAGNOSIS — E876 Hypokalemia: Secondary | ICD-10-CM | POA: Diagnosis not present

## 2019-08-22 DIAGNOSIS — E785 Hyperlipidemia, unspecified: Secondary | ICD-10-CM | POA: Diagnosis not present

## 2019-08-22 DIAGNOSIS — E1129 Type 2 diabetes mellitus with other diabetic kidney complication: Secondary | ICD-10-CM | POA: Diagnosis not present

## 2019-08-29 DIAGNOSIS — N1831 Chronic kidney disease, stage 3a: Secondary | ICD-10-CM | POA: Diagnosis not present

## 2019-08-29 DIAGNOSIS — I951 Orthostatic hypotension: Secondary | ICD-10-CM | POA: Diagnosis not present

## 2019-08-29 DIAGNOSIS — I700 Atherosclerosis of aorta: Secondary | ICD-10-CM | POA: Diagnosis not present

## 2019-08-29 DIAGNOSIS — E1122 Type 2 diabetes mellitus with diabetic chronic kidney disease: Secondary | ICD-10-CM | POA: Diagnosis not present

## 2019-08-30 DIAGNOSIS — E538 Deficiency of other specified B group vitamins: Secondary | ICD-10-CM | POA: Diagnosis not present

## 2019-09-12 ENCOUNTER — Other Ambulatory Visit: Payer: Self-pay

## 2019-09-12 ENCOUNTER — Encounter: Payer: Self-pay | Admitting: Dermatology

## 2019-09-12 ENCOUNTER — Ambulatory Visit (INDEPENDENT_AMBULATORY_CARE_PROVIDER_SITE_OTHER): Payer: Medicare Other | Admitting: Dermatology

## 2019-09-12 DIAGNOSIS — C44222 Squamous cell carcinoma of skin of right ear and external auricular canal: Secondary | ICD-10-CM | POA: Diagnosis not present

## 2019-09-12 DIAGNOSIS — L988 Other specified disorders of the skin and subcutaneous tissue: Secondary | ICD-10-CM | POA: Diagnosis not present

## 2019-09-12 DIAGNOSIS — C4492 Squamous cell carcinoma of skin, unspecified: Secondary | ICD-10-CM

## 2019-09-12 NOTE — Patient Instructions (Signed)

## 2019-09-16 ENCOUNTER — Telehealth: Payer: Self-pay | Admitting: Cardiology

## 2019-09-17 ENCOUNTER — Other Ambulatory Visit: Payer: Self-pay

## 2019-09-17 ENCOUNTER — Encounter (HOSPITAL_COMMUNITY): Payer: Self-pay | Admitting: Physical Therapy

## 2019-09-17 ENCOUNTER — Ambulatory Visit (HOSPITAL_COMMUNITY): Payer: Medicare Other | Attending: Internal Medicine | Admitting: Physical Therapy

## 2019-09-17 DIAGNOSIS — R2689 Other abnormalities of gait and mobility: Secondary | ICD-10-CM

## 2019-09-17 DIAGNOSIS — M6281 Muscle weakness (generalized): Secondary | ICD-10-CM | POA: Diagnosis not present

## 2019-09-17 DIAGNOSIS — R42 Dizziness and giddiness: Secondary | ICD-10-CM | POA: Diagnosis not present

## 2019-09-17 NOTE — Therapy (Signed)
[place], [place], [address] Phone: [phone]   Fax:  [phone]  Physical Therapy Evaluation  Patient Details  Name: Eric Brady MRN: [id number] Date of Birth: 21-Nov-1932 Referring Provider (PT): Asencion Noble MD    Encounter Date: 09/17/2019   PT End of Session - 09/17/19 1223    Visit Number 1    Number of Visits 16    Date for PT Re-Evaluation 11/15/19    Authorization Type BCBS Medicare (no auth, no VL)    Progress Note Due on Visit 10    PT Start Time 1030    PT Stop Time 1115    PT Time Calculation (min) 45 min    Equipment Utilized During Treatment Gait belt    Activity Tolerance Patient limited by fatigue    Behavior During Therapy Impulsive;WFL for tasks assessed/performed           Past Medical History:  Diagnosis Date  . Anemia   . Cancer [facility])    Prostate  . Diabetes mellitus without complication ([place])   . Hyperlipidemia   . Squamous cell carcinoma of skin 08/12/2019   in situ on right ear rim(CX3&EXC)    Past Surgical History:  Procedure Laterality Date  . COLONOSCOPY N/A 09/12/2012   Procedure: COLONOSCOPY;  Surgeon: Rogene Houston, MD;  Location[facility];  Service: Endoscopy;  Laterality: N/A;  915  . EYE SURGERY    . ORIF ANKLE FRACTURE Right   . YAG LASER APPLICATION Left 6/94/8546   Procedure: YAG LASER APPLICATION;  Surgeon: Williams Che, MD;  Location: [facility] OR[facility];  Service: Ophthalmology;  Laterality: Left;    There were no vitals filed for this visit.    Subjective Assessment - 09/17/19 1037    Subjective Patient presents to therapy with his son. His son reports that patient is here with complaint of weakness. He says he has recently undergone radiation treatment for prostate cancer. Says He is having trouble with strength, balance and dizziness. His son says he began noticing bouts of dizziness at the onset of weakness following radiation treatment and hormone  treatments.    Patient is accompained by: Family member   son   Pertinent History Hx of prostate cancer, DM II, hypotension    Limitations House hold activities;Standing;Walking;Other (comment)    How long can you stand comfortably? 10-15 min    How long can you walk comfortably? 10 min    Patient Stated Goals Get rid of this dizziness, go back to doing usual things    Currently in Pain? No/denies              [facility] PT Assessment - 09/17/19 0001      Assessment   Medical Diagnosis Gait Disturbance     Referring Provider (PT) Asencion Noble MD     Onset Date/Surgical Date --   october/ novemeber 2020   Next MD Visit --   3 months from now    Prior Therapy No      Precautions   Precautions Fall      Restrictions   Weight Bearing Restrictions No      Balance Screen   Has the patient fallen in the past 6 months Yes    How many times? 8-10 times     Has the patient had a decrease in activity level because of a fear of falling?  No    Is the patient reluctant to leave their  home because of a fear of falling?  No      Home Ecologist residence    Living Arrangements Alone;Children   children visit several times per week    Available Help at Discharge Family    Type of [place] to enter    Entrance Stairs-Number of Steps 2    Entrance Stairs-Rails None    Home Layout Two level;Able to live on main level with bedroom/bathroom      Prior Function   Level of Independence Independent      Cognition   Overall Cognitive Status Within Functional Limits for tasks assessed      Sensation   Light Touch Appears Intact      Posture/Postural Control   Posture/Postural Control Postural limitations    Postural Limitations Decreased lumbar lordosis;Rounded Shoulders;Forward head    Posture Comments slouched seated posture       ROM / Strength   AROM / PROM / Strength Strength      Strength   Strength Assessment Site  Hip;Knee;Ankle    Right/Left Hip Right;Left    Right Hip Flexion 4/5    Left Hip Flexion 4+/5    Right/Left Knee Right;Left    Right Knee Flexion 4/5    Right Knee Extension 4/5    Left Knee Flexion 4+/5    Left Knee Extension 4+/5    Right/Left Ankle Right;Left    Right Ankle Dorsiflexion 4+/5    Left Ankle Dorsiflexion 4/5      Transfers   Five time sit to stand comments  18 sec with heavy use of UEs and momentum, cues for upright posturing       Ambulation/Gait   Ambulation/Gait Yes    Ambulation/Gait Assistance 5: Supervision;4: Min guard    Assistive device None    Gait Pattern Decreased step length - right;Decreased step length - left;Decreased stance time - left;Decreased stride length;Decreased dorsiflexion - right;Decreased dorsiflexion - left;Poor foot clearance - left;Poor foot clearance - right    Ambulation Surface Level;Indoor      Standardized Balance Assessment   Standardized Balance Assessment Dynamic Gait Index      Dynamic Gait Index   Level Surface Mild Impairment    Change in Gait Speed Mild Impairment    Gait with Horizontal Head Turns Mild Impairment    Gait with Vertical Head Turns Mild Impairment    Gait and Pivot Turn Moderate Impairment    Step Over Obstacle Mild Impairment    Step Around Obstacles Moderate Impairment    Steps Mild Impairment    Total Score 14                      Objective measurements completed on examination: See above findings.               PT Education - 09/17/19 1042    Education Details on evaluation findings, and POC    Person(s) Educated Patient;Child(ren)    Methods Explanation    Comprehension Verbalized understanding            PT Short Term Goals - 09/17/19 1403      PT SHORT TERM GOAL #1   Title Patient will be independent with initial HEP and self-management strategies to improve functional outcomes    Time 4    Period Weeks    Status New    Target Date 10/18/19  PT  SHORT TERM GOAL #2   Title Patient will be able to perform stand x 5 (with improved form and less reliance on UEs)  in < 15 seconds to demonstrate improvement in functional mobility and reduced risk for falls.    Time 4    Period Weeks    Status New    Target Date 10/18/19             PT Long Term Goals - 09/17/19 1404      PT LONG TERM GOAL #1   Title Patient will have equal to or > 4+/5 MMT throughout BLE to improve ability to perform functional mobility, stair ambulation and ADLs.    Time 8    Period Weeks    Status New    Target Date 11/15/19      PT LONG TERM GOAL #2   Title Patient will report at least 50% overall improvement in subjective complaint to indicate improvement in ability to perform ADLs.    Time 8    Period Weeks    Status New    Target Date 11/15/19      PT LONG TERM GOAL #3   Title Patient will improve DGI by at least 4 points to demonstrate improvement in functional mobility and reduced risk for falls.    Time 8    Period Weeks    Status New    Target Date 11/15/19      PT LONG TERM GOAL #4   Title Patient will report improved confidence with ambulating on uneven surface (outside, around yard) with LRAD, with no recent falls, to demo improved functional mobility and ability to perform ADLs .    Time 8    Period Weeks    Status New    Target Date 11/15/19                  Plan - 09/17/19 1225    Clinical Impression Statement Patient is an 86 y.o. male who presents to physical therapy with complaint of decreased balance, weakness and dizziness. Patient demonstrates decreased strength, balance deficits and gait abnormalities which are negatively impacting patient ability to perform ADLs and functional mobility tasks. Patient will benefit from skilled physical therapy services to address these deficits to improve level of function with ADLs, functional mobility tasks, and reduce risk for falls.    Personal Factors and Comorbidities Comorbidity  2;Age    Comorbidities hypotension, DM, Hx of cancer    Examination-Activity Limitations Stand;Locomotion Level;Bend;Transfers;Stairs    Examination-Participation Restrictions Art gallery manager;Yard Work    Merchant navy officer Evolving/Moderate complexity    Clinical Decision Making Moderate    Rehab Potential Fair    PT Frequency 2x / week    PT Duration 8 weeks    PT Treatment/Interventions Aquatic Therapy;Biofeedback;Canalith Repostioning;Cryotherapy;Electrical Stimulation;Contrast Bath;Fluidtherapy;Therapeutic activities;Therapeutic exercise;Orthotic Fit/Training;Patient/family education;Manual lymph drainage;Compression bandaging;Taping;Vasopneumatic Device;Joint Manipulations;Splinting;Spinal Manipulations;Energy conservation;Dry needling;Manual techniques;Wheelchair mobility training;Passive range of motion;Scar mobilization;Prosthetic Training;Vestibular;Visual/perceptual remediation/compensation;Balance training;DME Instruction;Iontophoresis 4mg /ml Dexamethasone;Gait training;ADLs/Self Care Home Management;Neuromuscular re-education;Stair training;Moist Heat;Traction;Functional mobility training;Ultrasound;Parrafin;Cognitive remediation    PT Next Visit Plan Review goals. Assess eye movement/ tracking in relation to potential vestibular involvement. Perform Harland Dingwall if indicated. Progress LE strengthening and static balance activity as able. Issue HEP next visit    PT Home Exercise Plan Issue at next visit    Consulted and Agree with Plan of Care Patient;Family member/caregiver    Family Member Consulted Son           Patient will benefit  from skilled therapeutic intervention in order to improve the following deficits and impairments:  Abnormal gait, Improper body mechanics, Dizziness, Postural dysfunction, Decreased activity tolerance, Decreased endurance, Decreased strength, Decreased balance, Decreased safety awareness, Difficulty walking  Visit  Diagnosis: Other abnormalities of gait and mobility  Muscle weakness (generalized)  Dizziness and giddiness     Problem List There are no problems to display for this patient.  2:18 PM, 09/17/19 Josue Hector PT DPT  Physical Therapist with [facility]  [phone] [place] [address] [place], [place], [address] Phone: [phone]   Fax:  [phone]  Name: Eric Brady MRN: [id number] Date of Birth: [birth year]

## 2019-09-18 ENCOUNTER — Ambulatory Visit (HOSPITAL_COMMUNITY): Payer: Medicare Other | Admitting: Physical Therapy

## 2019-09-18 DIAGNOSIS — R42 Dizziness and giddiness: Secondary | ICD-10-CM

## 2019-09-18 DIAGNOSIS — R2689 Other abnormalities of gait and mobility: Secondary | ICD-10-CM

## 2019-09-18 DIAGNOSIS — M6281 Muscle weakness (generalized): Secondary | ICD-10-CM | POA: Diagnosis not present

## 2019-09-18 NOTE — Therapy (Addendum)
[place], [place], [address] Phone: [phone]   Fax:  [phone]  Physical Therapy Treatment  Patient Details  Name: Eric Brady MRN: [id number] Date of Birth: 19-Sep-1932 Referring Provider (PT): Asencion Noble MD    Encounter Date: 09/18/2019   PT End of Session - 09/18/19 0935    Visit Number 2    Number of Visits 16    Date for PT Re-Evaluation 11/15/19    Authorization Type BCBS Medicare (no auth, no VL)    Progress Note Due on Visit 10    PT Start Time 0830    PT Stop Time 0922    PT Time Calculation (min) 52 min    Equipment Utilized During Treatment Gait belt    Activity Tolerance Patient limited by fatigue    Behavior During Therapy Impulsive;WFL for tasks assessed/performed           Past Medical History:  Diagnosis Date  . Anemia   . Cancer [facility])    Prostate  . Diabetes mellitus without complication ([place])   . Hyperlipidemia   . Squamous cell carcinoma of skin 08/12/2019   in situ on right ear rim(CX3&EXC)    Past Surgical History:  Procedure Laterality Date  . COLONOSCOPY N/A 09/12/2012   Procedure: COLONOSCOPY;  Surgeon: Rogene Houston, MD;  [facility];  Service: Endoscopy;  Laterality: N/A;  915  . EYE SURGERY    . ORIF ANKLE FRACTURE Right   . YAG LASER APPLICATION Left 08/19/6158   Procedure: YAG LASER APPLICATION;  Surgeon: Williams Che, MD;  Location: [facility];  Service: Ophthalmology;  Laterality: Left;    There were no vitals filed for this visit.   Subjective Assessment - 09/18/19 0927    Subjective presents to therapy with his son.  States his most pertinent deficit is his balance.                   Vestibular Assessment - 09/18/19 0001      Oculomotor Exam   Saccades Overshoots   slight over shooting to LT, cadence erratic, despite cues    Comment difficulty with lateral movement, demos "catch up" most noted with LT gaze      Positional  Testing   Dix-Hallpike Dix-Hallpike Right;Dix-Hallpike Left      Dix-Hallpike Right   Dix-Hallpike Right Symptoms No nystagmus      Dix-Hallpike Left   Dix-Hallpike Left Symptoms No nystagmus   did note sligth increase in dizziness briefly when in RT SL                    [facility] Adult PT Treatment/Exercise - 09/18/19 0001      Knee/Hip Exercises: Standing   Hip Abduction Both;10 reps    Hip Extension Both;10 reps    Other Standing Knee Exercises tandem stance 30" interminttent HHA each LE lead      Knee/Hip Exercises: Seated   Other Seated Knee/Hip Exercises lateral tracking     Sit to Sand 2 sets;5 reps;without UE support                  PT Education - 09/18/19 0936    Education Details reviewed goals and POC moving forward.  Established HEP, provided rationale for therex added to program    Person(s) Educated Patient;Child(ren)    Methods Explanation;Demonstration;Tactile cues;Verbal cues;Handout    Comprehension Verbalized understanding;Returned demonstration;Verbal cues required;Tactile cues required;Need further instruction  PT Short Term Goals - 09/18/19 0857      PT SHORT TERM GOAL #1   Title Patient will be independent with initial HEP and self-management strategies to improve functional outcomes    Time 4    Period Weeks    Status On-going    Target Date 10/18/19      PT SHORT TERM GOAL #2   Title Patient will be able to perform stand x 5 (with improved form and less reliance on UEs)  in < 15 seconds to demonstrate improvement in functional mobility and reduced risk for falls.    Time 4    Period Weeks    Status On-going    Target Date 10/18/19             PT Long Term Goals - 09/18/19 0858      PT LONG TERM GOAL #1   Title Patient will have equal to or > 4+/5 MMT throughout BLE to improve ability to perform functional mobility, stair ambulation and ADLs.    Time 8    Period Weeks    Status On-going      PT LONG TERM  GOAL #2   Title Patient will report at least 50% overall improvement in subjective complaint to indicate improvement in ability to perform ADLs.    Time 8    Period Weeks    Status On-going      PT LONG TERM GOAL #3   Title Patient will improve DGI by at least 4 points to demonstrate improvement in functional mobility and reduced risk for falls.    Time 8    Period Weeks    Status On-going      PT LONG TERM GOAL #4   Title Patient will report improved confidence with ambulating on uneven surface (outside, around yard) with LRAD, with no recent falls, to demo improved functional mobility and ability to perform ADLs .    Time 8    Period Weeks    Status On-going                 Plan - 09/18/19 0936    Clinical Impression Statement Session began by evaluating therapist Josue Hector, PT) to assess eye movement/ tracking in relation to potential vestibular involvement.(8:30-8:50).  Session concluded with PTA (8:55-9:22) to review goals, establish HEP and answer any questions before concluding session.  Added sit to stands without UE assist with noted fatigue but no dizziness.  pt requires to break up into 2 sets of 5 with this activity.  Tandem and NBOS static balance completed with overall difficutly keeping eye gaze ahead and ability to maintain max of 15 seconds before needing UE assistance.  Pt generally impulsive at times making it difficult to follow instructions.  Cues to complete all exercises slowly and properly engage mm.  Given HEP to include hip strengthening and stability.  son present who states he will do these with him to ensure they are done properly and safely.    Personal Factors and Comorbidities Comorbidity 2;Age    Comorbidities hypotension, DM, Hx of cancer    Examination-Activity Limitations Stand;Locomotion Level;Bend;Transfers;Stairs    Examination-Participation Restrictions Art gallery manager;Yard Work    Merchant navy officer  Evolving/Moderate complexity    Rehab Potential Fair    PT Frequency 2x / week    PT Duration 8 weeks    PT Treatment/Interventions Aquatic Therapy;Biofeedback;Canalith Repostioning;Cryotherapy;Electrical Stimulation;Contrast Bath;Fluidtherapy;Therapeutic activities;Therapeutic exercise;Orthotic Fit/Training;Patient/family education;Manual lymph drainage;Compression bandaging;Taping;Vasopneumatic Device;Joint Manipulations;Splinting;Spinal Manipulations;Energy conservation;Dry needling;Manual techniques;Wheelchair mobility  training;Passive range of motion;Scar mobilization;Prosthetic Training;Vestibular;Visual/perceptual remediation/compensation;Balance training;DME Instruction;Iontophoresis 4mg /ml Dexamethasone;Gait training;ADLs/Self Care Home Management;Neuromuscular re-education;Stair training;Moist Heat;Traction;Functional mobility training;Ultrasound;Parrafin;Cognitive remediation    PT Next Visit Plan Progress LE strengthening and static balance activity as able. Review HEP next visit    PT Home Exercise Plan 6/16:  tandem stance, hip abd/ext, sit to stands, lateral visual tracking    Consulted and Agree with Plan of Care Patient;Family member/caregiver    Family Member Consulted Son           Patient will benefit from skilled therapeutic intervention in order to improve the following deficits and impairments:  Abnormal gait, Improper body mechanics, Dizziness, Postural dysfunction, Decreased activity tolerance, Decreased endurance, Decreased strength, Decreased balance, Decreased safety awareness, Difficulty walking  Visit Diagnosis: Other abnormalities of gait and mobility  Muscle weakness (generalized)  Dizziness and giddiness     Problem List There are no problems to display for this patient.  Teena Irani, PTA/CLT [phone]  11:02 AM, 09/18/19 Josue Hector PT DPT  Physical Therapist with [facility]  [phone] [place] [address] [place], [place], [address] Phone: [phone]   Fax:  [phone]  Name: Eric Brady MRN: [id number] Date of Birth: 05-Aug-1932

## 2019-09-18 NOTE — Patient Instructions (Signed)
Tandem Stance    Right foot in front of left, heel touching toe both feet "straight ahead". Stand on Foot Triangle of Support with both feet. Balance in this position _30__ seconds. Do with left foot in front of right.  ABDUCTION: Standing (Active)    Stand, feet flat. Lift right leg out to side. Complete _10__ repetitions. Perform _2__ sessions per day.   EXTENSION: Standing (Active)    Stand, both feet flat. Draw right leg behind body as far as possible. Complete _10__ repetitions. Perform __2_ sessions per day.    Functional Quadriceps: Sit to Stand    Sit on edge of chair, feet flat on floor. Stand upright, extending knees fully.  Repeat __5__ times per set. Do _2__ sets per session. Do _2__ sessions per day.

## 2019-09-20 ENCOUNTER — Ambulatory Visit: Payer: Medicare Other | Admitting: *Deleted

## 2019-09-20 ENCOUNTER — Other Ambulatory Visit: Payer: Self-pay

## 2019-09-20 DIAGNOSIS — Z4802 Encounter for removal of sutures: Secondary | ICD-10-CM

## 2019-09-20 NOTE — Progress Notes (Signed)
Here for suture removal right ear rim. Pathology results to patient. Healing well

## 2019-09-24 ENCOUNTER — Encounter (HOSPITAL_COMMUNITY): Payer: Self-pay | Admitting: Physical Therapy

## 2019-09-24 ENCOUNTER — Other Ambulatory Visit: Payer: Self-pay

## 2019-09-24 ENCOUNTER — Ambulatory Visit (HOSPITAL_COMMUNITY): Payer: Medicare Other | Admitting: Physical Therapy

## 2019-09-24 DIAGNOSIS — R42 Dizziness and giddiness: Secondary | ICD-10-CM | POA: Diagnosis not present

## 2019-09-24 DIAGNOSIS — R2689 Other abnormalities of gait and mobility: Secondary | ICD-10-CM | POA: Diagnosis not present

## 2019-09-24 DIAGNOSIS — M6281 Muscle weakness (generalized): Secondary | ICD-10-CM

## 2019-09-24 NOTE — Progress Notes (Signed)
Cardiology Office Note    Date:  09/30/2019   ID:  Eric Brady, DOB 1932-05-19, MRN [id number]  PCP:  Asencion Noble, MD  Cardiologist: Carlyle Dolly, MD EPS: None  Chief Complaint  Patient presents with  . Follow-up    History of Present Illness:  Eric Brady is a 86 y.o. male with history of orthostatic hypotension on midodrine and Florinef, chronic bradycardia with no significant pauses or high grade AV block on monitor 02/2019, HLD, CKD stage III, DM type II  She last saw [place], PA-C 07/17/2019 at which time his blood pressure had been elevated after a fall with a shoulder injury.  She asked him to titrate his Florinef back to 0.1 mg daily and continue to follow blood pressures.  Recomended compression stockings and possible abdominal binder.  Patient comes in accompanied by his son.  He is doing fill sickle therapy to gain strength and balance.  He has had trouble since receiving hormone treatments for his prostate cancer.  They stay with him most days of the week.  He only gets dizzy if he turns quickly.  His blood pressures have been stable at home on the lower dose Florinef and midodrine 3 times daily.  He is not wearing compression stockings because they are too difficult to get on.  They try to remind him to stay hydrated.  Past Medical History:  Diagnosis Date  . Anemia   . Cancer [facility])    Prostate  . Diabetes mellitus without complication ([place])   . Hyperlipidemia   . Squamous cell carcinoma of skin 08/12/2019   in situ on right ear rim(CX3&EXC)    Past Surgical History:  Procedure Laterality Date  . COLONOSCOPY N/A 09/12/2012   Procedure: COLONOSCOPY;  Surgeon: Rogene Houston, MD;  Location: [facility];  Service: Endoscopy;  Laterality: N/A;  915  . EYE SURGERY    . ORIF ANKLE FRACTURE Right   . YAG LASER APPLICATION Left 7/67/2094   Procedure: YAG LASER APPLICATION;  Surgeon: Williams Che, MD;  Location: [facility] ORS;  Service:  Ophthalmology;  Laterality: Left;    Current Medications: Current Meds  Medication Sig  . aspirin EC 81 MG tablet Take 81 mg by mouth daily. Swallow whole.  Marland Kitchen atorvastatin (LIPITOR) 10 MG tablet Take 10 mg by mouth at bedtime.   . calcium-vitamin D (OSCAL WITH D) 500-200 MG-UNIT tablet Take 2 tablets by mouth daily.   . cholecalciferol (VITAMIN D3) 25 MCG (1000 UNIT) tablet Take 1,000 Units by mouth daily.  . cyanocobalamin (,VITAMIN B-12,) 1000 MCG/ML injection Inject 1,000 mcg into the muscle every 30 (thirty) days.  . fludrocortisone (FLORINEF) 0.1 MG tablet Take 1 tablet (0.1 mg total) by mouth daily.  . metFORMIN (GLUCOPHAGE-XR) 500 MG 24 hr tablet Take 1,000 mg by mouth at bedtime.   . midodrine (PROAMATINE) 10 MG tablet Take 10 mg by mouth 3 (three) times daily.  Glory Rosebush VERIO test strip 1 each daily.     Allergies:   Patient has no known allergies.   Social History   Socioeconomic History  . Marital status: Married    Spouse name: Not on file  . Number of children: Not on file  . Years of education: Not on file  . Highest education level: Not on file  Occupational History  . Not on file  Tobacco Use  . Smoking status: Former Research scientist (life sciences)  . Smokeless tobacco: Never Used  Vaping Use  . Vaping Use:  Never used  Substance and Sexual Activity  . Alcohol use: No  . Drug use: Never  . Sexual activity: Not on file  Other Topics Concern  . Not on file  Social History Narrative  . Not on file   Social Determinants of Health   Financial Resource Strain:   . Difficulty of Paying Living Expenses:   Food Insecurity:   . Worried About Charity fundraiser in the Last Year:   . Arboriculturist in the Last Year:   Transportation Needs:   . Film/video editor (Medical):   Marland Kitchen Lack of Transportation (Non-Medical):   Physical Activity:   . Days of Exercise per Week:   . Minutes of Exercise per Session:   Stress:   . Feeling of Stress :   Social Connections:   . Frequency  of Communication with Friends and Family:   . Frequency of Social Gatherings with Friends and Family:   . Attends Religious Services:   . Active Member of Clubs or Organizations:   . Attends Archivist Meetings:   Marland Kitchen Marital Status:      Family History:  The patient's family history includes Cancer in his mother; Prostate cancer in his brother.   ROS:   Please see the history of present illness.    ROS All other systems reviewed and are negative.   PHYSICAL EXAM:   VS:  BP 118/64   Pulse 64   Ht 5\' 11"  (1.803 m)   Wt 180 lb (81.6 kg)   SpO2 97%   BMI 25.10 kg/m   Physical Exam  GEN: Well nourished, well developed, in no acute distress  Neck: no JVD, carotid bruits, or masses Cardiac:RRR; 2/6 systolic murmur at the left sternal border Respiratory:  clear to auscultation bilaterally, normal work of breathing GI: soft, nontender, nondistended, + BS Ext: without cyanosis, clubbing, or edema, Good distal pulses bilaterally Neuro:  Alert and Oriented x 3 Psych: euthymic mood, full affect  Wt Readings from Last 3 Encounters:  09/30/19 180 lb (81.6 kg)  07/17/19 177 lb 12.8 oz (80.6 kg)  07/14/19 180 lb (81.6 kg)      Studies/Labs Reviewed:   EKG:  EKG is not ordered today.   Recent Labs: 01/05/2019: ALT 36 07/14/2019: BUN 22; Creatinine, Ser 1.35; Hemoglobin 10.8; Platelets 173; Potassium 3.1; Sodium 140   Lipid Panel No results found for: CHOL, TRIG, HDL, CHOLHDL, VLDL, LDLCALC, LDLDIRECT  Additional studies/ records that were reviewed today include:  Echocardiogram: 02/2018 Study Conclusions   - Left ventricle: The cavity size was normal. Systolic function was    normal. The estimated ejection fraction was in the range of 60%    to 65%. Wall motion was normal; there were no regional wall    motion abnormalities. Left ventricular diastolic function    parameters were normal.  - Aortic valve: There was very mild stenosis. Valve area (Vmax):    2.29 cm^2.    - Mitral valve: Moderately calcified annulus. Moderately thickened    leaflets .  - Left atrium: The atrium was mildly dilated.  - Atrial septum: No defect or patent foramen ovale was identified.    Event Monitor: 02/2019  14 day holter monitor  Min HR 40, Max HR 96, Avg HR 52  Rare supraventricular ectopy in the form of isolated PACs and couplets  Rare ventricular ectopy in the form of isolated PVCs, couplets, trigeminy  Predominant rhythm was sinus bradycardia. No significant arrhythmias  Reported symptoms correlated with sinus rhythm and mild sinus bradycardia       ASSESSMENT:    1. Orthostatic hypotension   2. Essential hypertension   3. Bradycardia   4. Hyperlipidemia, unspecified hyperlipidemia type   5. Stage 3 chronic kidney disease, unspecified whether stage 3a or 3b CKD      PLAN:  In order of problems listed above:  Orthostatic hypotension-managed with hydration midodrine and lower dose Florinef 0.1 mg daily.  No recent trouble.  Follow-up with Dr. Harl Bowie in 6 months.  Hypertension blood pressure has normalized on lower dose Florinef.  Sinus bradycardia with monitor 02/2019 no significant pauses or high-grade AV block.  Avoid AV nodal blocking agents   Hyperlipidemia LDL 55 05/2019 on atorvastatin 10 mg daily  CKD stage III creatinine 1.35 07/14/2019   Medication Adjustments/Labs and Tests Ordered: Current medicines are reviewed at length with the patient today.  Concerns regarding medicines are outlined above.  Medication changes, Labs and Tests ordered today are listed in the Patient Instructions below. Patient Instructions  Medication Instructions:  Your physician recommends that you continue on your current medications as directed. Please refer to the Current Medication list given to you today.  *If you need a refill on your cardiac medications before your next appointment, please call your pharmacy*   Lab Work: NONE  If you have labs (blood  work) drawn today and your tests are completely normal, you will receive your results only by: Marland Kitchen MyChart Message (if you have MyChart) OR . A paper copy in the mail If you have any lab test that is abnormal or we need to change your treatment, we will call you to review the results.   Testing/Procedures: NONE  Follow-Up: At [facility], you and your health needs are our priority.  As part of our continuing mission to provide you with exceptional heart care, we have created designated Provider Care Teams.  These Care Teams include your primary Cardiologist (physician) and Advanced Practice Providers (APPs -  Physician Assistants and Nurse Practitioners) who all work together to provide you with the care you need, when you need it.  We recommend signing up for the patient portal called "MyChart".  Sign up information is provided on this After Visit Summary.  MyChart is used to connect with patients for Virtual Visits (Telemedicine).  Patients are able to view lab/test results, encounter notes, upcoming appointments, etc.  Non-urgent messages can be sent to your provider as well.   To learn more about what you can do with MyChart, go to [email or web address].    Your next appointment:   4-5 month(s)  The format for your next appointment:   In Person  Provider:   Carlyle Dolly, MD   Other Instructions Thank you for choosing [facility]!       Sumner Boast, PA-C  09/30/2019 11:52 AM    [facility] [place], [place], [place]  [address] Phone: [phone]; Fax: [phone]

## 2019-09-24 NOTE — Patient Instructions (Signed)
Access Code: [email or web address] URL: [email or web address] Date: 09/24/2019 Prepared by: [facility] Akiba Melfi  Exercises Seated Long Arc Quad - 2 x daily - 7 x weekly - 10 reps - 10 second hold Seated March - 2 x daily - 7 x weekly - 10 reps - 5 second hold

## 2019-09-24 NOTE — Therapy (Signed)
[place], [place], [address] Phone: [phone]   Fax:  [phone]  Physical Therapy Treatment  Patient Details  Name: Eric Brady MRN: [id number] Date of Birth: 1932-07-21 Referring Provider (PT): Asencion Noble MD    Encounter Date: 09/24/2019   PT End of Session - 09/24/19 1129    Visit Number 3    Number of Visits 16    Date for PT Re-Evaluation 11/15/19    Authorization Type BCBS Medicare (no auth, no VL)    Progress Note Due on Visit 10    PT Start Time 1117    PT Stop Time 1200    PT Time Calculation (min) 43 min    Equipment Utilized During Treatment Gait belt    Activity Tolerance Patient tolerated treatment well    Behavior During Therapy Impulsive;WFL for tasks assessed/performed           Past Medical History:  Diagnosis Date  . Anemia   . Cancer [facility])    Prostate  . Diabetes mellitus without complication ([place])   . Hyperlipidemia   . Squamous cell carcinoma of skin 08/12/2019   in situ on right ear rim(CX3&EXC)    Past Surgical History:  Procedure Laterality Date  . COLONOSCOPY N/A 09/12/2012   Procedure: COLONOSCOPY;  Surgeon: Rogene Houston, MD;  Location[facility];  Service: Endoscopy;  Laterality: N/A;  915  . EYE SURGERY    . ORIF ANKLE FRACTURE Right   . YAG LASER APPLICATION Left 0/93/2671   Procedure: YAG LASER APPLICATION;  Surgeon: Williams Che, MD;  Location: [facility] ORS;  Service: Ophthalmology;  Laterality: Left;    There were no vitals filed for this visit.   Subjective Assessment - 09/24/19 1117    Subjective Patient states he has not fell recently. Patient states he has been able to do the exercises. He is present with his daughter.    Patient is accompained by: Family member   son   Pertinent History Hx of prostate cancer, DM II, hypotension    Limitations House hold activities;Standing;Walking;Other (comment)    How long can you stand comfortably? 10-15 min     How long can you walk comfortably? 10 min    Patient Stated Goals Get rid of this dizziness, go back to doing usual things                             [facility] Adult PT Treatment/Exercise - 09/24/19 0001      Ambulation/Gait   Ambulation/Gait Yes    Ambulation/Gait Assistance 4: Min guard;5: Supervision    Ambulation Distance (Feet) 450 Feet    Assistive device None    Gait Pattern Decreased step length - right;Decreased step length - left;Decreased stance time - left;Decreased stride length;Decreased dorsiflexion - right;Decreased dorsiflexion - left;Poor foot clearance - left;Poor foot clearance - right    Ambulation Surface Level;Indoor      Knee/Hip Exercises: Standing   Hip Abduction Both;10 reps    Hip Extension Both;10 reps    Lateral Step Up Both;1 set;10 reps;Hand Hold: 0;Step Height: 4"    Forward Step Up Both;1 set;10 reps;Hand Hold: 0;Step Height: 4"    Other Standing Knee Exercises tandem stance 2x  30" interminttent HHA each LE lead    Other Standing Knee Exercises foot taps on step 20x each foot      Knee/Hip Exercises: Seated  Long Arc [employer] 1 set;10 reps    [employer] Weight 2 lbs.    Long [employer] Limitations 5-10 second holds    Sit to [employer] 5 reps;without UE support;3 sets                  PT Education - 09/24/19 1201    Education Details Review of HEP, mechanics of exercise, proper gait mechanics, improving foot clearance, improving posture, continuing HEP    Person(s) Educated Patient;Child(ren)    Methods Explanation;Handout;Demonstration;Verbal cues;Tactile cues    Comprehension Verbalized understanding;Returned demonstration            PT Short Term Goals - 09/18/19 0857      PT SHORT TERM GOAL #1   Title Patient will be independent with initial HEP and self-management strategies to improve functional outcomes    Time 4    Period Weeks    Status On-going    Target Date 10/18/19      PT SHORT TERM GOAL #2   Title  Patient will be able to perform stand x 5 (with improved form and less reliance on UEs)  in < 15 seconds to demonstrate improvement in functional mobility and reduced risk for falls.    Time 4    Period Weeks    Status On-going    Target Date 10/18/19             PT Long Term Goals - 09/18/19 0858      PT LONG TERM GOAL #1   Title Patient will have equal to or > 4+/5 MMT throughout BLE to improve ability to perform functional mobility, stair ambulation and ADLs.    Time 8    Period Weeks    Status On-going      PT LONG TERM GOAL #2   Title Patient will report at least 50% overall improvement in subjective complaint to indicate improvement in ability to perform ADLs.    Time 8    Period Weeks    Status On-going      PT LONG TERM GOAL #3   Title Patient will improve DGI by at least 4 points to demonstrate improvement in functional mobility and reduced risk for falls.    Time 8    Period Weeks    Status On-going      PT LONG TERM GOAL #4   Title Patient will report improved confidence with ambulating on uneven surface (outside, around yard) with LRAD, with no recent falls, to demo improved functional mobility and ability to perform ADLs .    Time 8    Period Weeks    Status On-going                 Plan - 09/24/19 1130    Clinical Impression Statement Patient shows moderate/severe sway with tandem stance requiring frequent UE support which improves with semi tandem positioning allowing for decreased UE use. He demonstrates moderate unsteadiness with stair exercises and has intermittent use of hands for loss of balance recovery. He requires min guard assist for all exercises today secondary to impaired balance. Patient requires frequent verbal cueing for improving foot clearance and posture with gait with limited carry over. He overall tolerates session well and states minimal fatigue following session. Patient will continue to benefit from skilled physical therapy in order  to reduce impairment and improve function.    Personal Factors and Comorbidities Comorbidity 2;Age    Comorbidities hypotension, DM, Hx of cancer  Examination-Activity Limitations Stand;Locomotion Level;Bend;Transfers;Stairs    Examination-Participation Restrictions Art gallery manager;Yard Work    Merchant navy officer Evolving/Moderate complexity    Rehab Potential Fair    PT Frequency 2x / week    PT Duration 8 weeks    PT Treatment/Interventions Aquatic Therapy;Biofeedback;Canalith Repostioning;Cryotherapy;Electrical Stimulation;Contrast Bath;Fluidtherapy;Therapeutic activities;Therapeutic exercise;Orthotic Fit/Training;Patient/family education;Manual lymph drainage;Compression bandaging;Taping;Vasopneumatic Device;Joint Manipulations;Splinting;Spinal Manipulations;Energy conservation;Dry needling;Manual techniques;Wheelchair mobility training;Passive range of motion;Scar mobilization;Prosthetic Training;Vestibular;Visual/perceptual remediation/compensation;Balance training;DME Instruction;Iontophoresis 4mg /ml Dexamethasone;Gait training;ADLs/Self Care Home Management;Neuromuscular re-education;Stair training;Moist Heat;Traction;Functional mobility training;Ultrasound;Parrafin;Cognitive remediation    PT Next Visit Plan Progress LE strengthening and static balance activity as able. Review HEP next visit    PT Home Exercise Plan 6/16:  tandem stance, hip abd/ext, sit to stands, lateral visual tracking 6/22 seated marching, LAQ    Consulted and Agree with Plan of Care Patient;Family member/caregiver    Family Member Consulted daughter           Patient will benefit from skilled therapeutic intervention in order to improve the following deficits and impairments:  Abnormal gait, Improper body mechanics, Dizziness, Postural dysfunction, Decreased activity tolerance, Decreased endurance, Decreased strength, Decreased balance, Decreased safety awareness, Difficulty  walking  Visit Diagnosis: Other abnormalities of gait and mobility  Muscle weakness (generalized)  Dizziness and giddiness     Problem List There are no problems to display for this patient.   12:08 PM, 09/24/19 Mearl Latin PT, DPT Physical Therapist at [place], [place], [address] Phone: [phone]   Fax:  [phone]  Name: Eric Brady MRN: [id number] Date of Birth: 19-Jun-1932

## 2019-09-26 ENCOUNTER — Ambulatory Visit: Payer: Medicare Other | Admitting: Cardiology

## 2019-09-26 ENCOUNTER — Other Ambulatory Visit: Payer: Self-pay

## 2019-09-26 ENCOUNTER — Ambulatory Visit (HOSPITAL_COMMUNITY): Payer: Medicare Other

## 2019-09-26 ENCOUNTER — Encounter (HOSPITAL_COMMUNITY): Payer: Self-pay

## 2019-09-26 DIAGNOSIS — R2689 Other abnormalities of gait and mobility: Secondary | ICD-10-CM

## 2019-09-26 DIAGNOSIS — M6281 Muscle weakness (generalized): Secondary | ICD-10-CM | POA: Diagnosis not present

## 2019-09-26 DIAGNOSIS — R42 Dizziness and giddiness: Secondary | ICD-10-CM | POA: Diagnosis not present

## 2019-09-26 NOTE — Therapy (Signed)
[place], [place], [address] Phone: [phone]   Fax:  [phone]  Physical Therapy Treatment  Patient Details  Name: Eric Brady MRN: [id number] Date of Birth: December 29, 1932 Referring Provider (PT): Asencion Noble MD    Encounter Date: 09/26/2019   PT End of Session - 09/26/19 1619    Visit Number 4    Number of Visits 16    Date for PT Re-Evaluation 11/15/19    Authorization Type BCBS Medicare (no auth, no VL)    Progress Note Due on Visit 10    PT Start Time 1615    PT Stop Time 1655    PT Time Calculation (min) 40 min    Equipment Utilized During Treatment Gait belt    Activity Tolerance Patient tolerated treatment well    Behavior During Therapy [facility] for tasks assessed/performed           Past Medical History:  Diagnosis Date  . Anemia   . Cancer [facility])    Prostate  . Diabetes mellitus without complication ([place])   . Hyperlipidemia   . Squamous cell carcinoma of skin 08/12/2019   in situ on right ear rim(CX3&EXC)    Past Surgical History:  Procedure Laterality Date  . COLONOSCOPY N/A 09/12/2012   Procedure: COLONOSCOPY;  Surgeon: Rogene Houston, MD;  Location: [facility];  Service: Endoscopy;  Laterality: N/A;  915  . EYE SURGERY    . ORIF ANKLE FRACTURE Right   . YAG LASER APPLICATION Left 2/83/1517   Procedure: YAG LASER APPLICATION;  Surgeon: Williams Che, MD;  Location: [facility] ORS;  Service: Ophthalmology;  Laterality: Left;    There were no vitals filed for this visit.   Subjective Assessment - 09/26/19 1618    Subjective Pt reports he is feeling good today, no reports of pain, dizzy episodes or recent falls.  Reports compliance iwth HEP daily.    Pertinent History Hx of prostate cancer, DM II, hypotension    Patient Stated Goals Get rid of this dizziness, go back to doing usual things    Currently in Pain? No/denies                             [facility] Adult PT  Treatment/Exercise - 09/26/19 0001      Ambulation/Gait   Ambulation/Gait Yes    Ambulation/Gait Assistance 4: Min guard;5: Supervision    Ambulation Distance (Feet) 226 Feet    Assistive device None    Gait Pattern Decreased step length - right;Decreased step length - left;Decreased stance time - left;Decreased stride length;Decreased dorsiflexion - right;Decreased dorsiflexion - left;Poor foot clearance - left;Poor foot clearance - right    Ambulation Surface Level;Indoor    Gait Comments cueing for foot cleararnce      Knee/Hip Exercises: Standing   Hip Abduction Both;10 reps    Abduction Limitations able to recall this HEP and demonstrate    Hip Extension Both;10 reps    Extension Limitations cueing for form/mechanics    Lateral Step Up Both;1 set;10 reps;Hand Hold: 0;Step Height: 4"    Forward Step Up Both;1 set;10 reps;Hand Hold: 0;Step Height: 4"    Other Standing Knee Exercises tandem stance 2x 30" intermittent HHA; posterior lean against wall shoulder then hips 10x each;     Other Standing Knee Exercises forward then lateral toe tapping on 6 in steps 20x alternating      Knee/Hip  Exercises: Seated   Long Arc Quad 1 set;10 reps    Long Arc Quad Weight 2 lbs.    Long [employer] Limitations 5-10 second holds    Sit to [employer] 10 reps;without UE support   eccentric control                   PT Short Term Goals - 09/18/19 0857      PT SHORT TERM GOAL #1   Title Patient will be independent with initial HEP and self-management strategies to improve functional outcomes    Time 4    Period Weeks    Status On-going    Target Date 10/18/19      PT SHORT TERM GOAL #2   Title Patient will be able to perform stand x 5 (with improved form and less reliance on UEs)  in < 15 seconds to demonstrate improvement in functional mobility and reduced risk for falls.    Time 4    Period Weeks    Status On-going    Target Date 10/18/19             PT Long Term Goals - 09/18/19  0858      PT LONG TERM GOAL #1   Title Patient will have equal to or > 4+/5 MMT throughout BLE to improve ability to perform functional mobility, stair ambulation and ADLs.    Time 8    Period Weeks    Status On-going      PT LONG TERM GOAL #2   Title Patient will report at least 50% overall improvement in subjective complaint to indicate improvement in ability to perform ADLs.    Time 8    Period Weeks    Status On-going      PT LONG TERM GOAL #3   Title Patient will improve DGI by at least 4 points to demonstrate improvement in functional mobility and reduced risk for falls.    Time 8    Period Weeks    Status On-going      PT LONG TERM GOAL #4   Title Patient will report improved confidence with ambulating on uneven surface (outside, around yard) with LRAD, with no recent falls, to demo improved functional mobility and ability to perform ADLs .    Time 8    Period Weeks    Status On-going                 Plan - 09/26/19 1644    Clinical Impression Statement Reviewed HEP compliance, pt able to recall hip abd/ext only.  Cueing for proper form and mechanics wiht hip ext without forward flexion.  Reviewed current exercises to complete at home, encouraged to complete by sink/counter for safety.  Session focus with functional strengthning, gait and balance training.  Pt demonstrated unsteadiness during gait and static balance activities required cueing for posture, min A for safety and/or intermittent HHA.  Added lateral toe tapping and wall taps for gluteal and core strenghtneing.  No reoprts of pain through session, was limited by fatigue.    Personal Factors and Comorbidities Comorbidity 2;Age    Comorbidities hypotension, DM, Hx of cancer    Examination-Activity Limitations Stand;Locomotion Level;Bend;Transfers;Stairs    Examination-Participation Restrictions Art gallery manager;Yard Work    Merchant navy officer Evolving/Moderate complexity    Clinical  Decision Making Moderate    Rehab Potential Fair    PT Frequency 2x / week    PT Duration 8 weeks    PT Treatment/Interventions  Aquatic Therapy;Biofeedback;Canalith Repostioning;Cryotherapy;Electrical Stimulation;Contrast Bath;Fluidtherapy;Therapeutic activities;Therapeutic exercise;Orthotic Fit/Training;Patient/family education;Manual lymph drainage;Compression bandaging;Taping;Vasopneumatic Device;Joint Manipulations;Splinting;Spinal Manipulations;Energy conservation;Dry needling;Manual techniques;Wheelchair mobility training;Passive range of motion;Scar mobilization;Prosthetic Training;Vestibular;Visual/perceptual remediation/compensation;Balance training;DME Instruction;Iontophoresis 4mg /ml Dexamethasone;Gait training;ADLs/Self Care Home Management;Neuromuscular re-education;Stair training;Moist Heat;Traction;Functional mobility training;Ultrasound;Parrafin;Cognitive remediation    PT Next Visit Plan Progress LE strengthening and static balance activity as able. Review HEP next visit    PT Home Exercise Plan 6/16:  tandem stance, hip abd/ext, sit to stands, lateral visual tracking 6/22 seated marching, LAQ           Patient will benefit from skilled therapeutic intervention in order to improve the following deficits and impairments:  Abnormal gait, Improper body mechanics, Dizziness, Postural dysfunction, Decreased activity tolerance, Decreased endurance, Decreased strength, Decreased balance, Decreased safety awareness, Difficulty walking  Visit Diagnosis: Muscle weakness (generalized)  Other abnormalities of gait and mobility  Dizziness and giddiness     Problem List There are no problems to display for this patient.  Ihor Austin, LPTA/CLT; CBIS [phone]  Aldona Lento 09/26/2019, 6:01 PM  [facility] [address] [place], [place], [address] Phone: [phone]   Fax:  [phone]  Name: DAMEIAN CRISMAN MRN:  [id number] Date of Birth: 1932/08/20

## 2019-09-30 ENCOUNTER — Other Ambulatory Visit: Payer: Self-pay

## 2019-09-30 ENCOUNTER — Ambulatory Visit: Payer: Medicare Other | Admitting: Physician Assistant

## 2019-09-30 ENCOUNTER — Encounter: Payer: Self-pay | Admitting: Physician Assistant

## 2019-09-30 VITALS — BP 118/64 | HR 64 | Ht 71.0 in | Wt 180.0 lb

## 2019-09-30 DIAGNOSIS — I951 Orthostatic hypotension: Secondary | ICD-10-CM | POA: Diagnosis not present

## 2019-09-30 DIAGNOSIS — I10 Essential (primary) hypertension: Secondary | ICD-10-CM

## 2019-09-30 DIAGNOSIS — E785 Hyperlipidemia, unspecified: Secondary | ICD-10-CM | POA: Diagnosis not present

## 2019-09-30 DIAGNOSIS — R001 Bradycardia, unspecified: Secondary | ICD-10-CM

## 2019-09-30 DIAGNOSIS — N183 Chronic kidney disease, stage 3 unspecified: Secondary | ICD-10-CM

## 2019-09-30 NOTE — Patient Instructions (Signed)
Medication Instructions:  Your physician recommends that you continue on your current medications as directed. Please refer to the Current Medication list given to you today.  *If you need a refill on your cardiac medications before your next appointment, please call your pharmacy*   Lab Work: NONE  If you have labs (blood work) drawn today and your tests are completely normal, you will receive your results only by: Marland Kitchen MyChart Message (if you have MyChart) OR . A paper copy in the mail If you have any lab test that is abnormal or we need to change your treatment, we will call you to review the results.   Testing/Procedures: NONE  Follow-Up: At [facility], you and your health needs are our priority.  As part of our continuing mission to provide you with exceptional heart care, we have created designated Provider Care Teams.  These Care Teams include your primary Cardiologist (physician) and Advanced Practice Providers (APPs -  Physician Assistants and Nurse Practitioners) who all work together to provide you with the care you need, when you need it.  We recommend signing up for the patient portal called "MyChart".  Sign up information is provided on this After Visit Summary.  MyChart is used to connect with patients for Virtual Visits (Telemedicine).  Patients are able to view lab/test results, encounter notes, upcoming appointments, etc.  Non-urgent messages can be sent to your provider as well.   To learn more about what you can do with MyChart, go to [email or web address].    Your next appointment:   4-5 month(s)  The format for your next appointment:   In Person  Provider:   Carlyle Dolly, MD   Other Instructions Thank you for choosing [facility]!

## 2019-10-01 ENCOUNTER — Ambulatory Visit (HOSPITAL_COMMUNITY): Payer: Medicare Other | Admitting: Physical Therapy

## 2019-10-01 ENCOUNTER — Encounter (HOSPITAL_COMMUNITY): Payer: Self-pay | Admitting: Physical Therapy

## 2019-10-01 DIAGNOSIS — R42 Dizziness and giddiness: Secondary | ICD-10-CM

## 2019-10-01 DIAGNOSIS — M6281 Muscle weakness (generalized): Secondary | ICD-10-CM | POA: Diagnosis not present

## 2019-10-01 DIAGNOSIS — R2689 Other abnormalities of gait and mobility: Secondary | ICD-10-CM

## 2019-10-01 NOTE — Therapy (Signed)
[place], [place], [address] Phone: [phone]   Fax:  [phone]  Physical Therapy Treatment  Patient Details  Name: Eric Brady MRN: [id number] Date of Birth: Aug 05, 1932 Referring Provider (PT): Asencion Noble MD    Encounter Date: 10/01/2019   PT End of Session - 10/01/19 1307    Visit Number 5    Number of Visits 16    Date for PT Re-Evaluation 11/15/19    Authorization Type BCBS Medicare (no auth, no VL)    Progress Note Due on Visit 10    PT Start Time 1304    PT Stop Time 1343    PT Time Calculation (min) 39 min    Equipment Utilized During Treatment Gait belt    Activity Tolerance Patient tolerated treatment well    Behavior During Therapy [facility] for tasks assessed/performed           Past Medical History:  Diagnosis Date  . Anemia   . Cancer [facility])    Prostate  . Diabetes mellitus without complication ([place])   . Hyperlipidemia   . Squamous cell carcinoma of skin 08/12/2019   in situ on right ear rim(CX3&EXC)    Past Surgical History:  Procedure Laterality Date  . COLONOSCOPY N/A 09/12/2012   Procedure: COLONOSCOPY;  Surgeon: Rogene Houston, MD;  Location[facility];  Service: Endoscopy;  Laterality: N/A;  915  . EYE SURGERY    . ORIF ANKLE FRACTURE Right   . YAG LASER APPLICATION Left 4/85/4627   Procedure: YAG LASER APPLICATION;  Surgeon: Williams Che, MD;  Location: [facility] ORS;  Service: Ophthalmology;  Laterality: Left;    There were no vitals filed for this visit.   Subjective Assessment - 10/01/19 1306    Subjective Patient reports no new issues since last visit.    Pertinent History Hx of prostate cancer, DM II, hypotension    Patient Stated Goals Get rid of this dizziness, go back to doing usual things    Currently in Pain? No/denies                             [facility] Adult PT Treatment/Exercise - 10/01/19 0001      Knee/Hip Exercises: Standing   Heel  Raises Both;15 reps    Hip Abduction Both;15 reps    Hip Extension Both;15 reps      Knee/Hip Exercises: Seated   Sit to Sand 10 reps;without UE support               Balance Exercises - 10/01/19 0001      Balance Exercises: Standing   Standing Eyes Opened Narrow base of support (BOS);Foam/compliant surface;3 reps;30 secs    Tandem Stance Eyes open;Intermittent upper extremity support;3 reps;20 secs    Step Ups Forward;4 inch;UE support 1   x10 each    Retro Gait 2 reps    Sidestepping 2 reps    Other Standing Exercises standing on foam with head turns x 15                PT Short Term Goals - 09/18/19 0857      PT SHORT TERM GOAL #1   Title Patient will be independent with initial HEP and self-management strategies to improve functional outcomes    Time 4    Period Weeks    Status On-going    Target Date 10/18/19  PT SHORT TERM GOAL #2   Title Patient will be able to perform stand x 5 (with improved form and less reliance on UEs)  in < 15 seconds to demonstrate improvement in functional mobility and reduced risk for falls.    Time 4    Period Weeks    Status On-going    Target Date 10/18/19             PT Long Term Goals - 09/18/19 0858      PT LONG TERM GOAL #1   Title Patient will have equal to or > 4+/5 MMT throughout BLE to improve ability to perform functional mobility, stair ambulation and ADLs.    Time 8    Period Weeks    Status On-going      PT LONG TERM GOAL #2   Title Patient will report at least 50% overall improvement in subjective complaint to indicate improvement in ability to perform ADLs.    Time 8    Period Weeks    Status On-going      PT LONG TERM GOAL #3   Title Patient will improve DGI by at least 4 points to demonstrate improvement in functional mobility and reduced risk for falls.    Time 8    Period Weeks    Status On-going      PT LONG TERM GOAL #4   Title Patient will report improved confidence with ambulating on  uneven surface (outside, around yard) with LRAD, with no recent falls, to demo improved functional mobility and ability to perform ADLs .    Time 8    Period Weeks    Status On-going                 Plan - 10/01/19 1342    Clinical Impression Statement Patient tolerated session well today. Patient continues to be challenged with balance activity, is showing improvement with static balance but continues to require intermittent HHA for stability. Patient cued on sequencing of LEs with step taps and box step ups, encouraged to pace activity when getting off balance to reset BOS. Patient able to progress dynamic balance activity today. Was well challenged with added retro walking, demos small step gait and required cues for positioning, but no LOB. Patient will continue to benefit from skill therapy services to progress LE Strength and balance to improve functional mobility and reduce risk for falls.    Personal Factors and Comorbidities Comorbidity 2;Age    Comorbidities hypotension, DM, Hx of cancer    Examination-Activity Limitations Stand;Locomotion Level;Bend;Transfers;Stairs    Examination-Participation Restrictions Art gallery manager;Yard Work    Merchant navy officer Evolving/Moderate complexity    Rehab Potential Fair    PT Frequency 2x / week    PT Duration 8 weeks    PT Treatment/Interventions Aquatic Therapy;Biofeedback;Canalith Repostioning;Cryotherapy;Electrical Stimulation;Contrast Bath;Fluidtherapy;Therapeutic activities;Therapeutic exercise;Orthotic Fit/Training;Patient/family education;Manual lymph drainage;Compression bandaging;Taping;Vasopneumatic Device;Joint Manipulations;Splinting;Spinal Manipulations;Energy conservation;Dry needling;Manual techniques;Wheelchair mobility training;Passive range of motion;Scar mobilization;Prosthetic Training;Vestibular;Visual/perceptual remediation/compensation;Balance training;DME Instruction;Iontophoresis 4mg /ml  Dexamethasone;Gait training;ADLs/Self Care Home Management;Neuromuscular re-education;Stair training;Moist Heat;Traction;Functional mobility training;Ultrasound;Parrafin;Cognitive remediation    PT Next Visit Plan Progress LE strengthening and static balance activity as able. Continue dynamic balance    PT Home Exercise Plan 6/16:  tandem stance, hip abd/ext, sit to stands, lateral visual tracking 6/22 seated marching, LAQ    Family Member Consulted Son           Patient will benefit from skilled therapeutic intervention in order to improve the following deficits and impairments:  Abnormal gait, Improper  body mechanics, Dizziness, Postural dysfunction, Decreased activity tolerance, Decreased endurance, Decreased strength, Decreased balance, Decreased safety awareness, Difficulty walking  Visit Diagnosis: Muscle weakness (generalized)  Other abnormalities of gait and mobility  Dizziness and giddiness     Problem List There are no problems to display for this patient.   1:48 PM, 10/01/19 Josue Hector PT DPT  Physical Therapist with [facility]  [phone] [place] [address] [place], [place], [address] Phone: [phone]   Fax:  [phone]  Name: Eric Brady MRN: [id number] Date of Birth: 02-19-1933

## 2019-10-03 ENCOUNTER — Other Ambulatory Visit: Payer: Self-pay

## 2019-10-03 ENCOUNTER — Encounter (HOSPITAL_COMMUNITY): Payer: Self-pay | Admitting: Physical Therapy

## 2019-10-03 ENCOUNTER — Ambulatory Visit (HOSPITAL_COMMUNITY): Payer: Medicare Other | Attending: Internal Medicine | Admitting: Physical Therapy

## 2019-10-03 DIAGNOSIS — R2689 Other abnormalities of gait and mobility: Secondary | ICD-10-CM | POA: Insufficient documentation

## 2019-10-03 DIAGNOSIS — M6281 Muscle weakness (generalized): Secondary | ICD-10-CM | POA: Diagnosis not present

## 2019-10-03 DIAGNOSIS — R42 Dizziness and giddiness: Secondary | ICD-10-CM | POA: Diagnosis not present

## 2019-10-03 DIAGNOSIS — E538 Deficiency of other specified B group vitamins: Secondary | ICD-10-CM | POA: Diagnosis not present

## 2019-10-03 NOTE — Therapy (Signed)
[facility] [address] [place], [place], [address] Phone: [phone]   Fax:  [phone]  Physical Therapy Treatment  Patient Details  Name: Eric Brady MRN: [id number] Date of Birth: [birth year] Referring Provider (PT): Asencion Noble MD    Encounter Date: 10/03/2019   PT End of Session - 10/03/19 0811    Visit Number 6    Number of Visits 16    Date for PT Re-Evaluation 11/15/19    Authorization Type BCBS Medicare (no auth, no VL)    Progress Note Due on Visit 10    PT Start Time 0816    PT Stop Time 0858    PT Time Calculation (min) 42 min    Equipment Utilized During Treatment Gait belt    Activity Tolerance Patient tolerated treatment well;Patient limited by fatigue    Behavior During Therapy [facility] for tasks assessed/performed           Past Medical History:  Diagnosis Date  . Anemia   . Cancer [facility])    Prostate  . Diabetes mellitus without complication ([place])   . Hyperlipidemia   . Squamous cell carcinoma of skin 08/12/2019   in situ on right ear rim(CX3&EXC)    Past Surgical History:  Procedure Laterality Date  . COLONOSCOPY N/A 09/12/2012   Procedure: COLONOSCOPY;  Surgeon: Rogene Houston, MD;  Location[facility];  Service: Endoscopy;  Laterality: N/A;  915  . EYE SURGERY    . ORIF ANKLE FRACTURE Right   . YAG LASER APPLICATION Left 8/33/8250   Procedure: YAG LASER APPLICATION;  Surgeon: Williams Che, MD;  Location: [facility] ORS;  Service: Ophthalmology;  Laterality: Left;    There were no vitals filed for this visit.   Subjective Assessment - 10/03/19 0819    Subjective Patient says his RT knee is bothering him today. Says it started this morning, woke him up at night. Says he is not sure why. Says he is wearing a knee brace right now which is helping some. Reports no pain currently. No balance troubles, no falls since last visit.    Pertinent History Hx of prostate cancer, DM II, hypotension    Patient  Stated Goals Get rid of this dizziness, go back to doing usual things    Currently in Pain? No/denies                             [facility] Adult PT Treatment/Exercise - 10/03/19 0001      Knee/Hip Exercises: Standing   Heel Raises Both;15 reps    Heel Raises Limitations toe raises x 15     Hip Abduction Both;15 reps    Hip Extension Both;15 reps      Knee/Hip Exercises: Seated   Sit to Sand 10 reps;without UE support               Balance Exercises - 10/03/19 0001      Balance Exercises: Standing   Standing Eyes Opened Narrow base of support (BOS);Foam/compliant surface;3 reps;30 secs    Tandem Stance Eyes open;Intermittent upper extremity support;2 reps;30 secs    Tandem Gait Forward;2 reps    Retro Gait 2 reps    Sidestepping 2 reps    Other Standing Exercises NBOS with head turns x 15     Other Standing Exercises Comments 4 inch step taps x 20  PT Short Term Goals - 09/18/19 0857      PT SHORT TERM GOAL #1   Title Patient will be independent with initial HEP and self-management strategies to improve functional outcomes    Time 4    Period Weeks    Status On-going    Target Date 10/18/19      PT SHORT TERM GOAL #2   Title Patient will be able to perform stand x 5 (with improved form and less reliance on UEs)  in < 15 seconds to demonstrate improvement in functional mobility and reduced risk for falls.    Time 4    Period Weeks    Status On-going    Target Date 10/18/19             PT Long Term Goals - 09/18/19 0858      PT LONG TERM GOAL #1   Title Patient will have equal to or > 4+/5 MMT throughout BLE to improve ability to perform functional mobility, stair ambulation and ADLs.    Time 8    Period Weeks    Status On-going      PT LONG TERM GOAL #2   Title Patient will report at least 50% overall improvement in subjective complaint to indicate improvement in ability to perform ADLs.    Time 8    Period Weeks     Status On-going      PT LONG TERM GOAL #3   Title Patient will improve DGI by at least 4 points to demonstrate improvement in functional mobility and reduced risk for falls.    Time 8    Period Weeks    Status On-going      PT LONG TERM GOAL #4   Title Patient will report improved confidence with ambulating on uneven surface (outside, around yard) with LRAD, with no recent falls, to demo improved functional mobility and ability to perform ADLs .    Time 8    Period Weeks    Status On-going                 Plan - 10/03/19 0859    Clinical Impression Statement Patient demos difficulty with standing toe raises, uses compensation from hips to achieve, unable to correct with verbal cueing. Patient shows some improvement today in static balance activity, but continues to require verbal cues for upright posturing. Patient was well challenged with dynamic balance activity today, and required min guard with tandem gait and retro gait. Patient shows some instability with pivoting at end of retro walk with tendency to lose balance posteriorly. Patient made aware of this and was able to improve with verbal cues. Patient with noted fatigue and some pulling in lumbar area toward EOS, activity graded per patient tolerance and patient encouraged to take rest breaks when needed. Patient will continue to benefit from skilled therapy services to progress LE strength and balance to improve functional mobility and reduce risk for falls.    Personal Factors and Comorbidities Comorbidity 2;Age    Comorbidities hypotension, DM, Hx of cancer    Examination-Activity Limitations Stand;Locomotion Level;Bend;Transfers;Stairs    Examination-Participation Restrictions Art gallery manager;Yard Work    Merchant navy officer Evolving/Moderate complexity    Rehab Potential Fair    PT Frequency 2x / week    PT Duration 8 weeks    PT Treatment/Interventions Aquatic Therapy;Biofeedback;Canalith  Repostioning;Cryotherapy;Electrical Stimulation;Contrast Bath;Fluidtherapy;Therapeutic activities;Therapeutic exercise;Orthotic Fit/Training;Patient/family education;Manual lymph drainage;Compression bandaging;Taping;Vasopneumatic Device;Joint Manipulations;Splinting;Spinal Manipulations;Energy conservation;Dry needling;Manual techniques;Wheelchair mobility training;Passive range of motion;Scar mobilization;Prosthetic Training;Vestibular;Visual/perceptual  remediation/compensation;Balance training;DME Instruction;Iontophoresis 4mg /ml Dexamethasone;Gait training;ADLs/Self Care Home Management;Neuromuscular re-education;Stair training;Moist Heat;Traction;Functional mobility training;Ultrasound;Parrafin;Cognitive remediation    PT Next Visit Plan Progress LE strengthening and static balance activity as able. Continue dynamic balance. Add obstacle step over    PT Home Exercise Plan 6/16:  tandem stance, hip abd/ext, sit to stands, lateral visual tracking 6/22 seated marching, LAQ    Family Member Consulted Son           Patient will benefit from skilled therapeutic intervention in order to improve the following deficits and impairments:  Abnormal gait, Improper body mechanics, Dizziness, Postural dysfunction, Decreased activity tolerance, Decreased endurance, Decreased strength, Decreased balance, Decreased safety awareness, Difficulty walking  Visit Diagnosis: Muscle weakness (generalized)  Other abnormalities of gait and mobility  Dizziness and giddiness     Problem List There are no problems to display for this patient.   9:00 AM, 10/03/19 Josue Hector PT DPT  Physical Therapist with [facility]  [phone] [place] [address] [place], [place], [address] Phone: [phone]   Fax:  [phone]  Name: Eric Brady MRN: [id number] Date of Birth: October 03, 1932

## 2019-10-07 ENCOUNTER — Encounter: Payer: Self-pay | Admitting: Dermatology

## 2019-10-07 NOTE — Progress Notes (Signed)
   Follow-Up Visit   Subjective  Eric Brady is a 86 y.o. male who presents for the following: Procedure (here for treatment for right ear rim- CIS).  Skin cancer Location: Right ear Duration:  Quality:  Associated Signs/Symptoms: Modifying Factors:  Severity:  Timing: Context:  For treatment The following portions of the chart were reviewed this encounter and updated as appropriate: Tobacco  Allergies  Meds  Problems  Med Hx  Surg Hx  Fam Hx      Objective  Well appearing patient in no apparent distress; mood and affect are within normal limits.  A focused examination was performed including Head and neck.. Relevant physical exam findings are noted in the Assessment and Plan.   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  Destruction of lesion  Skin excision  Lesion length (cm):  2.2 Lesion width (cm):  1 Margin per side (cm):  0 Total excision diameter (cm):  2.2 Informed consent: discussed and consent obtained   Timeout: patient name, date of birth, surgical site, and procedure verified   Procedure prep:  Patient was prepped and draped in usual sterile fashion Prep type:  Chlorhexidine Anesthesia: the lesion was anesthetized in a standard fashion   Anesthetic:  1% lidocaine w/ epinephrine 1-100,000 local infiltration Instrument used: #15 blade   Hemostasis achieved with: suture   Outcome: patient tolerated procedure well with no complications   Post-procedure details: sterile dressing applied and wound care instructions given   Dressing type: petrolatum    Specimen 1 - Surgical pathology Differential Diagnosis: scc [id number] Check Margins: YES Cx3 & EXC 2.2cm 4-0 Nylon x 3 Superior margin stained  Curettage showed that this lesion was both wide and focally in the.  After C&D x3 the knee marked was excised and simple closure accomplished.

## 2019-10-09 ENCOUNTER — Ambulatory Visit (HOSPITAL_COMMUNITY): Payer: Medicare Other | Admitting: Physical Therapy

## 2019-10-09 ENCOUNTER — Encounter (HOSPITAL_COMMUNITY): Payer: Self-pay | Admitting: Physical Therapy

## 2019-10-09 ENCOUNTER — Other Ambulatory Visit: Payer: Self-pay

## 2019-10-09 DIAGNOSIS — M6281 Muscle weakness (generalized): Secondary | ICD-10-CM | POA: Diagnosis not present

## 2019-10-09 DIAGNOSIS — R42 Dizziness and giddiness: Secondary | ICD-10-CM

## 2019-10-09 DIAGNOSIS — R2689 Other abnormalities of gait and mobility: Secondary | ICD-10-CM | POA: Diagnosis not present

## 2019-10-09 NOTE — Therapy (Signed)
[place], [place], [address] Phone: [phone]   Fax:  [phone]  Physical Therapy Treatment  Patient Details  Name: NYHEIM SEUFERT MRN: [id number] Date of Birth: 1932-06-06 Referring Provider (PT): Asencion Noble MD    Encounter Date: 10/09/2019   PT End of Session - 10/09/19 1034    Visit Number 7    Number of Visits 16    Date for PT Re-Evaluation 11/15/19    Authorization Type BCBS Medicare (no auth, no VL)    Progress Note Due on Visit 10    PT Start Time 1031    PT Stop Time 1111    PT Time Calculation (min) 40 min    Equipment Utilized During Treatment Gait belt    Activity Tolerance Patient tolerated treatment well;Patient limited by fatigue    Behavior During Therapy [facility] for tasks assessed/performed           Past Medical History:  Diagnosis Date   Anemia    Cancer ([place])    Prostate   Diabetes mellitus without complication ([place])    Hyperlipidemia    Squamous cell carcinoma of skin 08/12/2019   in situ on right ear rim(CX3&EXC)    Past Surgical History:  Procedure Laterality Date   COLONOSCOPY N/A 09/12/2012   Procedure: COLONOSCOPY;  Surgeon: Rogene Houston, MD;  Location[facility];  Service: Endoscopy;  Laterality: N/A;  915   EYE SURGERY     ORIF ANKLE FRACTURE Right    YAG LASER APPLICATION Left 07/12/7351   Procedure: YAG LASER APPLICATION;  Surgeon: Williams Che, MD;  Location: [facility] ORS;  Service: Ophthalmology;  Laterality: Left;    There were no vitals filed for this visit.   Subjective Assessment - 10/09/19 1033    Subjective Patient denied any pain or any recent falls.    Pertinent History Hx of prostate cancer, DM II, hypotension    Patient Stated Goals Get rid of this dizziness, go back to doing usual things    Currently in Pain? No/denies                             [facility] Adult PT Treatment/Exercise - 10/09/19 0001      Knee/Hip  Exercises: Standing   Heel Raises Both;15 reps    Heel Raises Limitations toe raises x 15    Cues to decrease hip compensation   Hip Abduction Both;15 reps    Abduction Limitations Tactile cues to prevent trunk lean    Hip Extension Both;15 reps      Knee/Hip Exercises: Seated   Sit to Sand 10 reps;without UE support               Balance Exercises - 10/09/19 0001      Balance Exercises: Standing   Standing Eyes Closed Solid surface;4 reps;20 secs   in corner, intermittent HHA   Tandem Stance Eyes open;Intermittent upper extremity support;2 reps;30 secs    Tandem Gait Forward;2 reps   15'   Retro Gait 2 reps   15'   Step Over Hurdles / Cones 15'. (4)6-inch hurdles forward x 3 RT. Lateral x 2 RT. Min guard    Other Standing Exercises NBOS with head turns 2 x 15 horizontal and vertical             PT Education - 10/09/19 1117    Education Details Discussed purpose and technique  of interventions throughout.    Person(s) Educated Patient;Child(ren)    Methods Explanation    Comprehension Verbalized understanding            PT Short Term Goals - 09/18/19 0857      PT SHORT TERM GOAL #1   Title Patient will be independent with initial HEP and self-management strategies to improve functional outcomes    Time 4    Period Weeks    Status On-going    Target Date 10/18/19      PT SHORT TERM GOAL #2   Title Patient will be able to perform stand x 5 (with improved form and less reliance on UEs)  in < 15 seconds to demonstrate improvement in functional mobility and reduced risk for falls.    Time 4    Period Weeks    Status On-going    Target Date 10/18/19             PT Long Term Goals - 09/18/19 0858      PT LONG TERM GOAL #1   Title Patient will have equal to or > 4+/5 MMT throughout BLE to improve ability to perform functional mobility, stair ambulation and ADLs.    Time 8    Period Weeks    Status On-going      PT LONG TERM GOAL #2   Title Patient will  report at least 50% overall improvement in subjective complaint to indicate improvement in ability to perform ADLs.    Time 8    Period Weeks    Status On-going      PT LONG TERM GOAL #3   Title Patient will improve DGI by at least 4 points to demonstrate improvement in functional mobility and reduced risk for falls.    Time 8    Period Weeks    Status On-going      PT LONG TERM GOAL #4   Title Patient will report improved confidence with ambulating on uneven surface (outside, around yard) with LRAD, with no recent falls, to demo improved functional mobility and ability to perform ADLs .    Time 8    Period Weeks    Status On-going                 Plan - 10/09/19 1123    Clinical Impression Statement Introduced stepping over hurdles for balance challenge this session. Patient required min guard with this. Patient also required some cueing to slow down and correct footing when patient occasionally got his lower extremities crossed while ambulating. Also added standing balance with eyes closed this session. Patient reported some dizziness with this, and demonstrated some wobbliness with this, but overall was able to self-correct to maintain his balance with minimal use of his hands. Patient did require intermittent therapeutic rest breaks during the session to decrease dizziness and to recover from fatigue.    Personal Factors and Comorbidities Comorbidity 2;Age    Comorbidities hypotension, DM, Hx of cancer    Examination-Activity Limitations Stand;Locomotion Level;Bend;Transfers;Stairs    Examination-Participation Restrictions Art gallery manager;Yard Work    Merchant navy officer Evolving/Moderate complexity    Rehab Potential Fair    PT Frequency 2x / week    PT Duration 8 weeks    PT Treatment/Interventions Aquatic Therapy;Biofeedback;Canalith Repostioning;Cryotherapy;Electrical Stimulation;Contrast Bath;Fluidtherapy;Therapeutic activities;Therapeutic  exercise;Orthotic Fit/Training;Patient/family education;Manual lymph drainage;Compression bandaging;Taping;Vasopneumatic Device;Joint Manipulations;Splinting;Spinal Manipulations;Energy conservation;Dry needling;Manual techniques;Wheelchair mobility training;Passive range of motion;Scar mobilization;Prosthetic Training;Vestibular;Visual/perceptual remediation/compensation;Balance training;DME Instruction;Iontophoresis 4mg /ml Dexamethasone;Gait training;ADLs/Self Care Home Management;Neuromuscular re-education;Stair training;Moist Heat;Traction;Functional mobility training;Ultrasound;Parrafin;Cognitive  remediation    PT Next Visit Plan Progress LE strengthening and static balance activity as able. Continue dynamic balance. Add cone tap    PT Home Exercise Plan 6/16:  tandem stance, hip abd/ext, sit to stands, lateral visual tracking 6/22 seated marching, LAQ    Family Member Consulted Son           Patient will benefit from skilled therapeutic intervention in order to improve the following deficits and impairments:  Abnormal gait, Improper body mechanics, Dizziness, Postural dysfunction, Decreased activity tolerance, Decreased endurance, Decreased strength, Decreased balance, Decreased safety awareness, Difficulty walking  Visit Diagnosis: Muscle weakness (generalized)  Other abnormalities of gait and mobility  Dizziness and giddiness     Problem List There are no problems to display for this patient.  Clarene Critchley PT, DPT 11:27 AM, 10/09/19 [place] [address] [place], [place], [address] Phone: [phone]   Fax:  [phone]  Name: FERGUSON GERTNER MRN: [id number] Date of Birth: 05/19/1932

## 2019-10-10 ENCOUNTER — Encounter (HOSPITAL_COMMUNITY): Payer: Medicare Other | Admitting: Physical Therapy

## 2019-10-15 ENCOUNTER — Ambulatory Visit (HOSPITAL_COMMUNITY): Payer: Medicare Other | Admitting: Physical Therapy

## 2019-10-15 ENCOUNTER — Other Ambulatory Visit: Payer: Self-pay

## 2019-10-15 ENCOUNTER — Encounter (HOSPITAL_COMMUNITY): Payer: Self-pay | Admitting: Physical Therapy

## 2019-10-15 DIAGNOSIS — R42 Dizziness and giddiness: Secondary | ICD-10-CM

## 2019-10-15 DIAGNOSIS — R2689 Other abnormalities of gait and mobility: Secondary | ICD-10-CM | POA: Diagnosis not present

## 2019-10-15 DIAGNOSIS — M6281 Muscle weakness (generalized): Secondary | ICD-10-CM | POA: Diagnosis not present

## 2019-10-15 NOTE — Therapy (Signed)
[facility] [address] [place], [place], [address] Phone: [phone]   Fax:  [phone]  Physical Therapy Treatment  Patient Details  Name: Eric Brady MRN: [id number] Date of Birth: May 20, 1932 Referring Provider (PT): Asencion Noble MD    Encounter Date: 10/15/2019   PT End of Session - 10/15/19 0901    Visit Number 8    Number of Visits 16    Date for PT Re-Evaluation 11/15/19    Authorization Type BCBS Medicare (no auth, no VL)    Progress Note Due on Visit 10    PT Start Time 0902    PT Stop Time 0942    PT Time Calculation (min) 40 min    Equipment Utilized During Treatment Gait belt    Activity Tolerance Patient tolerated treatment well;Patient limited by fatigue    Behavior During Therapy [facility] for tasks assessed/performed           Past Medical History:  Diagnosis Date  . Anemia   . Cancer [facility])    Prostate  . Diabetes mellitus without complication ([place])   . Hyperlipidemia   . Squamous cell carcinoma of skin 08/12/2019   in situ on right ear rim(CX3&EXC)    Past Surgical History:  Procedure Laterality Date  . COLONOSCOPY N/A 09/12/2012   Procedure: COLONOSCOPY;  Surgeon: Rogene Houston, MD;  [facility];  Service: Endoscopy;  Laterality: N/A;  915  . EYE SURGERY    . ORIF ANKLE FRACTURE Right   . YAG LASER APPLICATION Left 05/30/3333   Procedure: YAG LASER APPLICATION;  Surgeon: Williams Che, MD;  Location: [facility];  Service: Ophthalmology;  Laterality: Left;    There were no vitals filed for this visit.   Subjective Assessment - 10/15/19 0905    Subjective Patient says he is "hanging in there" Reports no new issues since last visit.    Pertinent History Hx of prostate cancer, DM II, hypotension    Patient Stated Goals Get rid of this dizziness, go back to doing usual things    Currently in Pain? No/denies                             [facility] Adult PT Treatment/Exercise -  10/15/19 0001      Knee/Hip Exercises: Standing   Heel Raises Both;15 reps    Heel Raises Limitations toe raises x 15     Knee Flexion Both;1 set;15 reps    Hip Abduction Both;15 reps    Hip Extension Both;15 reps    Forward Step Up Both;1 set;10 reps;Step Height: 6";Hand Hold: 0      Knee/Hip Exercises: Seated   Sit to Sand 10 reps;without UE support               Balance Exercises - 10/15/19 0001      Balance Exercises: Standing   Standing Eyes Opened Narrow base of support (BOS);Foam/compliant surface;3 reps;30 secs    Tandem Stance Eyes open;Intermittent upper extremity support;2 reps;30 secs   semi tandem    Rockerboard 10 reps;Anterior/posterior;Intermittent UE support    Tandem Gait Forward;2 reps    Retro Gait 2 reps    Sidestepping 2 reps    Other Standing Exercises NBOS with head turns 2 x 10 horizontal and vertical               PT Short Term Goals - 09/18/19 4562  PT SHORT TERM GOAL #1   Title Patient will be independent with initial HEP and self-management strategies to improve functional outcomes    Time 4    Period Weeks    Status On-going    Target Date 10/18/19      PT SHORT TERM GOAL #2   Title Patient will be able to perform stand x 5 (with improved form and less reliance on UEs)  in < 15 seconds to demonstrate improvement in functional mobility and reduced risk for falls.    Time 4    Period Weeks    Status On-going    Target Date 10/18/19             PT Long Term Goals - 09/18/19 0858      PT LONG TERM GOAL #1   Title Patient will have equal to or > 4+/5 MMT throughout BLE to improve ability to perform functional mobility, stair ambulation and ADLs.    Time 8    Period Weeks    Status On-going      PT LONG TERM GOAL #2   Title Patient will report at least 50% overall improvement in subjective complaint to indicate improvement in ability to perform ADLs.    Time 8    Period Weeks    Status On-going      PT LONG TERM GOAL  #3   Title Patient will improve DGI by at least 4 points to demonstrate improvement in functional mobility and reduced risk for falls.    Time 8    Period Weeks    Status On-going      PT LONG TERM GOAL #4   Title Patient will report improved confidence with ambulating on uneven surface (outside, around yard) with LRAD, with no recent falls, to demo improved functional mobility and ability to perform ADLs .    Time 8    Period Weeks    Status On-going                 Plan - 10/15/19 0942    Clinical Impression Statement Patient continues to be challenged with static and dynamic balance activity. Patient remains unsteady with tandem standing but shows improved ability with semi tandem. Patient continues to demo instability in posterior direction when stepping backward form step and retro walking. Added rocker board in anterior to posterior direction to focus on balance strategy regarding this deficit. Patient showing improved LE strength with sit to stands. Patient will continue to benefit from skilled therapy services to progress LE strength and balance to reduce risk for falls.    Personal Factors and Comorbidities Comorbidity 2;Age    Comorbidities hypotension, DM, Hx of cancer    Examination-Activity Limitations Stand;Locomotion Level;Bend;Transfers;Stairs    Examination-Participation Restrictions Art gallery manager;Yard Work    Merchant navy officer Evolving/Moderate complexity    Rehab Potential Fair    PT Frequency 2x / week    PT Duration 8 weeks    PT Treatment/Interventions Aquatic Therapy;Biofeedback;Canalith Repostioning;Cryotherapy;Electrical Stimulation;Contrast Bath;Fluidtherapy;Therapeutic activities;Therapeutic exercise;Orthotic Fit/Training;Patient/family education;Manual lymph drainage;Compression bandaging;Taping;Vasopneumatic Device;Joint Manipulations;Splinting;Spinal Manipulations;Energy conservation;Dry needling;Manual techniques;Wheelchair  mobility training;Passive range of motion;Scar mobilization;Prosthetic Training;Vestibular;Visual/perceptual remediation/compensation;Balance training;DME Instruction;Iontophoresis 4mg /ml Dexamethasone;Gait training;ADLs/Self Care Home Management;Neuromuscular re-education;Stair training;Moist Heat;Traction;Functional mobility training;Ultrasound;Parrafin;Cognitive remediation    PT Next Visit Plan Progress LE strengthening and static balance activity as able. Continue dynamic balance. Add cone tap    PT Home Exercise Plan 6/16:  tandem stance, hip abd/ext, sit to stands, lateral visual tracking 6/22 seated marching, LAQ  Consulted and Agree with Plan of Care Patient    Family Member Consulted daughter           Patient will benefit from skilled therapeutic intervention in order to improve the following deficits and impairments:  Abnormal gait, Improper body mechanics, Dizziness, Postural dysfunction, Decreased activity tolerance, Decreased endurance, Decreased strength, Decreased balance, Decreased safety awareness, Difficulty walking  Visit Diagnosis: Muscle weakness (generalized)  Other abnormalities of gait and mobility  Dizziness and giddiness     Problem List There are no problems to display for this patient.   10:42 AM, 10/15/19 Josue Hector PT DPT  Physical Therapist with [facility]  [phone] [place] [address] [place], [place], [address] Phone: [phone]   Fax:  [phone]  Name: Eric Brady MRN: [id number] Date of Birth: 1932-09-01

## 2019-10-17 ENCOUNTER — Other Ambulatory Visit: Payer: Self-pay

## 2019-10-17 ENCOUNTER — Ambulatory Visit (HOSPITAL_COMMUNITY): Payer: Medicare Other | Admitting: Physical Therapy

## 2019-10-17 DIAGNOSIS — R42 Dizziness and giddiness: Secondary | ICD-10-CM

## 2019-10-17 DIAGNOSIS — R2689 Other abnormalities of gait and mobility: Secondary | ICD-10-CM

## 2019-10-17 DIAGNOSIS — M6281 Muscle weakness (generalized): Secondary | ICD-10-CM | POA: Diagnosis not present

## 2019-10-17 NOTE — Therapy (Signed)
[facility] [address] [place], [place], [address] Phone: [phone]   Fax:  [phone]  Physical Therapy Treatment  Patient Details  Name: Eric Brady MRN: [id number] Date of Birth: October 27, 1932 Referring Provider (PT): Asencion Noble MD    Encounter Date: 10/17/2019    Past Medical History:  Diagnosis Date  . Anemia   . Cancer [facility])    Prostate  . Diabetes mellitus without complication ([place])   . Hyperlipidemia   . Squamous cell carcinoma of skin 08/12/2019   in situ on right ear rim(CX3&EXC)    Past Surgical History:  Procedure Laterality Date  . COLONOSCOPY N/A 09/12/2012   Procedure: COLONOSCOPY;  Surgeon: Rogene Houston, MD;  [facility];  Service: Endoscopy;  Laterality: N/A;  915  . EYE SURGERY    . ORIF ANKLE FRACTURE Right   . YAG LASER APPLICATION Left 6/60/6301   Procedure: YAG LASER APPLICATION;  Surgeon: Williams Che, MD;  Location: [facility];  Service: Ophthalmology;  Laterality: Left;    There were no vitals filed for this visit.   Subjective Assessment - 10/17/19 0847    Subjective pt states he had a dizzy spell when he first got out of bed this morning (daughter states this is when he usually has them but overall better).                             [facility] Adult PT Treatment/Exercise - 10/17/19 0001      Knee/Hip Exercises: Standing   Heel Raises Both;15 reps    Heel Raises Limitations toe raises x 15     Knee Flexion Both;1 set;15 reps    Hip Abduction Both;15 reps    Hip Extension Both;15 reps    Lateral Step Up Both;10 reps;Hand Hold: 1;Step Height: 6"    Forward Step Up Both;1 set;10 reps;Step Height: 6";Hand Hold: 0      Knee/Hip Exercises: Seated   Sit to Sand 10 reps;without UE support               Balance Exercises - 10/17/19 0001      Balance Exercises: Standing   Standing Eyes Opened Narrow base of support (BOS);Foam/compliant surface;3 reps;30  secs    Tandem Stance Eyes open;Foam/compliant surface   PVC 10X each flexion   Tandem Gait Forward;2 reps    Retro Gait 2 reps    Sidestepping 2 reps    Other Standing Exercises NBOS with head turns 2 x 10 horizontal and vertical   on foam              PT Short Term Goals - 09/18/19 0857      PT SHORT TERM GOAL #1   Title Patient will be independent with initial HEP and self-management strategies to improve functional outcomes    Time 4    Period Weeks    Status On-going    Target Date 10/18/19      PT SHORT TERM GOAL #2   Title Patient will be able to perform stand x 5 (with improved form and less reliance on UEs)  in < 15 seconds to demonstrate improvement in functional mobility and reduced risk for falls.    Time 4    Period Weeks    Status On-going    Target Date 10/18/19             PT Long Term Goals -  09/18/19 0858      PT LONG TERM GOAL #1   Title Patient will have equal to or > 4+/5 MMT throughout BLE to improve ability to perform functional mobility, stair ambulation and ADLs.    Time 8    Period Weeks    Status On-going      PT LONG TERM GOAL #2   Title Patient will report at least 50% overall improvement in subjective complaint to indicate improvement in ability to perform ADLs.    Time 8    Period Weeks    Status On-going      PT LONG TERM GOAL #3   Title Patient will improve DGI by at least 4 points to demonstrate improvement in functional mobility and reduced risk for falls.    Time 8    Period Weeks    Status On-going      PT LONG TERM GOAL #4   Title Patient will report improved confidence with ambulating on uneven surface (outside, around yard) with LRAD, with no recent falls, to demo improved functional mobility and ability to perform ADLs .    Time 8    Period Weeks    Status On-going                 Plan - 10/17/19 1045    Clinical Impression Statement Continued with focus on improving LE strength and stability.  Pt  continues to be challenged with head turns while standing in NBOS, however able to self correct minimal LOB.  Progressed tandem stance to foam with added UE flexion using PVC pipe and added cone challenge to improve glute strength.  Pt only reported shoulder fatigue with activity and slight LOB challenges.  Pt is improving well overall.  Continues to need rest breaks.    Personal Factors and Comorbidities Comorbidity 2;Age    Comorbidities hypotension, DM, Hx of cancer    Examination-Activity Limitations Stand;Locomotion Level;Bend;Transfers;Stairs    Examination-Participation Restrictions Art gallery manager;Yard Work    Merchant navy officer Evolving/Moderate complexity    Rehab Potential Fair    PT Frequency 2x / week    PT Duration 8 weeks    PT Treatment/Interventions Aquatic Therapy;Biofeedback;Canalith Repostioning;Cryotherapy;Electrical Stimulation;Contrast Bath;Fluidtherapy;Therapeutic activities;Therapeutic exercise;Orthotic Fit/Training;Patient/family education;Manual lymph drainage;Compression bandaging;Taping;Vasopneumatic Device;Joint Manipulations;Splinting;Spinal Manipulations;Energy conservation;Dry needling;Manual techniques;Wheelchair mobility training;Passive range of motion;Scar mobilization;Prosthetic Training;Vestibular;Visual/perceptual remediation/compensation;Balance training;DME Instruction;Iontophoresis 4mg /ml Dexamethasone;Gait training;ADLs/Self Care Home Management;Neuromuscular re-education;Stair training;Moist Heat;Traction;Functional mobility training;Ultrasound;Parrafin;Cognitive remediation    PT Next Visit Plan Progress LE strengthening and static balance activity as able. Continue dynamic balance.    PT Home Exercise Plan 6/16:  tandem stance, hip abd/ext, sit to stands, lateral visual tracking 6/22 seated marching, LAQ    Consulted and Agree with Plan of Care Patient    Family Member Consulted daughter           Patient will benefit from  skilled therapeutic intervention in order to improve the following deficits and impairments:  Abnormal gait, Improper body mechanics, Dizziness, Postural dysfunction, Decreased activity tolerance, Decreased endurance, Decreased strength, Decreased balance, Decreased safety awareness, Difficulty walking  Visit Diagnosis: Dizziness and giddiness  Other abnormalities of gait and mobility  Muscle weakness (generalized)     Problem List There are no problems to display for this patient.  Teena Irani, PTA/CLT [phone]  Teena Irani 10/17/2019, 10:48 AM  [facility] [address] [place], [place], [address] Phone: [phone]   Fax:  [phone]  Name: ROWE WARMAN MRN: [id number] Date of Birth:  02/06/1933   

## 2019-10-21 ENCOUNTER — Other Ambulatory Visit: Payer: Self-pay

## 2019-10-21 ENCOUNTER — Encounter (HOSPITAL_COMMUNITY): Payer: Self-pay | Admitting: Physical Therapy

## 2019-10-21 ENCOUNTER — Ambulatory Visit (HOSPITAL_COMMUNITY): Payer: Medicare Other | Admitting: Physical Therapy

## 2019-10-21 DIAGNOSIS — R2689 Other abnormalities of gait and mobility: Secondary | ICD-10-CM | POA: Diagnosis not present

## 2019-10-21 DIAGNOSIS — R42 Dizziness and giddiness: Secondary | ICD-10-CM

## 2019-10-21 DIAGNOSIS — M6281 Muscle weakness (generalized): Secondary | ICD-10-CM | POA: Diagnosis not present

## 2019-10-21 NOTE — Therapy (Signed)
[place], [place], [address] Phone: [phone]   Fax:  [phone]  Physical Therapy Treatment  Patient Details  Name: Eric Brady MRN: [id number] Date of Birth: Dec 04, 1932 Referring Provider (PT): Asencion Noble MD    Encounter Date: 10/21/2019   PT End of Session - 10/21/19 1039    Visit Number 9    Number of Visits 16    Date for PT Re-Evaluation 11/15/19    Authorization Type BCBS Medicare (no auth, no VL)    Progress Note Due on Visit 10    PT Start Time 1035    PT Stop Time 1115    PT Time Calculation (min) 40 min    Equipment Utilized During Treatment Gait belt    Activity Tolerance Patient tolerated treatment well;Patient limited by fatigue    Behavior During Therapy [facility] for tasks assessed/performed           Past Medical History:  Diagnosis Date  . Anemia   . Cancer [facility])    Prostate  . Diabetes mellitus without complication ([place])   . Hyperlipidemia   . Squamous cell carcinoma of skin 08/12/2019   in situ on right ear rim(CX3&EXC)    Past Surgical History:  Procedure Laterality Date  . COLONOSCOPY N/A 09/12/2012   Procedure: COLONOSCOPY;  Surgeon: Rogene Houston, MD;  Location: [facility] ENDO SU[facility];  Service: Endoscopy;  Laterality: N/A;  915  . EYE SURGERY    . ORIF ANKLE FRACTURE Right   . YAG LASER APPLICATION Left 1/94/1740   Procedure: YAG LASER APPLICATION;  Surgeon: Williams Che, MD;  Location: [facility] ORS;  Service: Ophthalmology;  Laterality: Left;    There were no vitals filed for this visit.   Subjective Assessment - 10/21/19 1037    Subjective Patient says he was doing well until this morning. Has another dizzy spell he says. Sat for a few minutes and it passed. Feeling better now.    Currently in Pain? No/denies                             [facility] Adult PT Treatment/Exercise - 10/21/19 0001      Knee/Hip Exercises: Seated   Sit to Sand 10 reps;without UE  support               Balance Exercises - 10/21/19 0001      Balance Exercises: Standing   Standing Eyes Opened Narrow base of support (BOS);Foam/compliant surface;3 reps;30 secs    Standing Eyes Closed Narrow base of support (BOS);3 reps;30 secs    Rockerboard Anterior/posterior;Intermittent UE support;10 reps    Gait with Head Turns Forward;3 reps   horizontal and vertical 3 x each   Tandem Gait Forward;3 reps    Marching Foam/compliant surface;20 reps;Intermittent upper extremity assist    Other Standing Exercises NBOS on foam with head turns 2 x 10 horizontal and vertical each     Other Standing Exercises Comments cone taps 5 x BLE HHA x 1               PT Short Term Goals - 09/18/19 0857      PT SHORT TERM GOAL #1   Title Patient will be independent with initial HEP and self-management strategies to improve functional outcomes    Time 4    Period Weeks    Status On-going    Target Date 10/18/19  PT SHORT TERM GOAL #2   Title Patient will be able to perform stand x 5 (with improved form and less reliance on UEs)  in < 15 seconds to demonstrate improvement in functional mobility and reduced risk for falls.    Time 4    Period Weeks    Status On-going    Target Date 10/18/19             PT Long Term Goals - 09/18/19 0858      PT LONG TERM GOAL #1   Title Patient will have equal to or > 4+/5 MMT throughout BLE to improve ability to perform functional mobility, stair ambulation and ADLs.    Time 8    Period Weeks    Status On-going      PT LONG TERM GOAL #2   Title Patient will report at least 50% overall improvement in subjective complaint to indicate improvement in ability to perform ADLs.    Time 8    Period Weeks    Status On-going      PT LONG TERM GOAL #3   Title Patient will improve DGI by at least 4 points to demonstrate improvement in functional mobility and reduced risk for falls.    Time 8    Period Weeks    Status On-going      PT  LONG TERM GOAL #4   Title Patient will report improved confidence with ambulating on uneven surface (outside, around yard) with LRAD, with no recent falls, to demo improved functional mobility and ability to perform ADLs .    Time 8    Period Weeks    Status On-going                 Plan - 10/21/19 1134    Clinical Impression Statement Patient continues to be challenged with dynamic balance and balance on compliant surfaces. Patient showed improved balance with NBOS on foam with eyes closed today, but had significant difficulty with gait with head turns. Patient required frequent verbal cues to increased BOS for this, and demos several instances of poor ground clearance, crossing over feet. Patient continues to demo tendency to lose balance when stepping backward. Added cone taps to improve BLE coordination. Patient reports no dizziness with gait with head turns, though showed significant difficulty, but did report intermittent dizziness during standing cone taps with HHA x 1. Complaint decreased with rest.    Personal Factors and Comorbidities Comorbidity 2;Age    Comorbidities hypotension, DM, Hx of cancer    Examination-Activity Limitations Stand;Locomotion Level;Bend;Transfers;Stairs    Examination-Participation Restrictions Art gallery manager;Yard Work    Merchant navy officer Evolving/Moderate complexity    Rehab Potential Fair    PT Frequency 2x / week    PT Duration 8 weeks    PT Treatment/Interventions Aquatic Therapy;Biofeedback;Canalith Repostioning;Cryotherapy;Electrical Stimulation;Contrast Bath;Fluidtherapy;Therapeutic activities;Therapeutic exercise;Orthotic Fit/Training;Patient/family education;Manual lymph drainage;Compression bandaging;Taping;Vasopneumatic Device;Joint Manipulations;Splinting;Spinal Manipulations;Energy conservation;Dry needling;Manual techniques;Wheelchair mobility training;Passive range of motion;Scar mobilization;Prosthetic  Training;Vestibular;Visual/perceptual remediation/compensation;Balance training;DME Instruction;Iontophoresis 4mg /ml Dexamethasone;Gait training;ADLs/Self Care Home Management;Neuromuscular re-education;Stair training;Moist Heat;Traction;Functional mobility training;Ultrasound;Parrafin;Cognitive remediation    PT Next Visit Plan Progress LE strengthening and static balance activity as able. Continue dynamic balance.    PT Home Exercise Plan 6/16:  tandem stance, hip abd/ext, sit to stands, lateral visual tracking 6/22 seated marching, LAQ    Consulted and Agree with Plan of Care Patient    Family Member Consulted daughter           Patient will benefit from skilled therapeutic intervention in  order to improve the following deficits and impairments:  Abnormal gait, Improper body mechanics, Dizziness, Postural dysfunction, Decreased activity tolerance, Decreased endurance, Decreased strength, Decreased balance, Decreased safety awareness, Difficulty walking  Visit Diagnosis: Dizziness and giddiness  Other abnormalities of gait and mobility  Muscle weakness (generalized)     Problem List There are no problems to display for this patient.   11:41 AM, 10/21/19 Josue Hector PT DPT  Physical Therapist with [facility]  [phone] [place] [address] [place], [place], [address] Phone: [phone]   Fax:  [phone]  Name: Eric Brady MRN: [id number] Date of Birth: 1932-10-24

## 2019-10-23 ENCOUNTER — Ambulatory Visit (HOSPITAL_COMMUNITY): Payer: Medicare Other | Admitting: Physical Therapy

## 2019-10-23 ENCOUNTER — Other Ambulatory Visit: Payer: Self-pay

## 2019-10-23 ENCOUNTER — Encounter (HOSPITAL_COMMUNITY): Payer: Self-pay | Admitting: Physical Therapy

## 2019-10-23 DIAGNOSIS — M6281 Muscle weakness (generalized): Secondary | ICD-10-CM | POA: Diagnosis not present

## 2019-10-23 DIAGNOSIS — R2689 Other abnormalities of gait and mobility: Secondary | ICD-10-CM

## 2019-10-23 DIAGNOSIS — R42 Dizziness and giddiness: Secondary | ICD-10-CM | POA: Diagnosis not present

## 2019-10-23 NOTE — Therapy (Signed)
[place], [place], [address] Phone: [phone]   Fax:  [phone]  Physical Therapy Treatment/Progress note  Patient Details  Name: Eric Brady MRN: [id number] Date of Birth: January 19, 1933 Referring Provider (PT): Asencion Noble MD    Encounter Date: 10/23/2019  Progress Note   Reporting Period 09/17/19 to 10/23/19   See note below for Objective Data and Assessment of Progress/Goals    PT End of Session - 10/23/19 1033    Visit Number 10    Number of Visits 16    Date for PT Re-Evaluation 11/15/19    Authorization Type BCBS Medicare (no auth, no VL)    Progress Note Due on Visit 20    PT Start Time 1034    PT Stop Time 1114    PT Time Calculation (min) 40 min    Equipment Utilized During Treatment Gait belt    Activity Tolerance Patient tolerated treatment well;Patient limited by fatigue    Behavior During Therapy [facility] for tasks assessed/performed           Past Medical History:  Diagnosis Date   Anemia    Cancer ([place])    Prostate   Diabetes mellitus without complication ([place])    Hyperlipidemia    Squamous cell carcinoma of skin 08/12/2019   in situ on right ear rim(CX3&EXC)    Past Surgical History:  Procedure Laterality Date   COLONOSCOPY N/A 09/12/2012   Procedure: COLONOSCOPY;  Surgeon: Rogene Houston, MD;  Location[facility];  Service: Endoscopy;  Laterality: N/A;  915   EYE SURGERY     ORIF ANKLE FRACTURE Right    YAG LASER APPLICATION Left 2/84/1324   Procedure: YAG LASER APPLICATION;  Surgeon: Williams Che, MD;  Location: [facility] ORS;  Service: Ophthalmology;  Laterality: Left;    There were no vitals filed for this visit.   Subjective Assessment - 10/23/19 1039    Subjective Patient has not been doing his home exercises. Patient states 5% improvement with physical therapy intervention. He still wants to work on strength, stamina, and balance. He is highly motivated to  improve function.    Patient is accompained by: Family member    Pertinent History Hx of prostate cancer, DM II, hypotension    Limitations House hold activities;Standing;Walking;Other (comment)    Currently in Pain? No/denies              [facility] PT Assessment - 10/23/19 0001      Assessment   Medical Diagnosis Gait Disturbance     Referring Provider (PT) Asencion Noble MD     Prior Therapy No      Precautions   Precautions Fall      Restrictions   Weight Bearing Restrictions No      Balance Screen   Has the patient fallen in the past 6 months Yes    How many times? 8-10 times    Has the patient had a decrease in activity level because of a fear of falling?  No    Is the patient reluctant to leave their home because of a fear of falling?  No      Prior Function   Level of Independence Independent      Cognition   Overall Cognitive Status Within Functional Limits for tasks assessed      Observation/Other Assessments   Observations Ambulates without AD      Strength   Right Hip Flexion 4+/5  Left Hip Flexion 4+/5    Right Knee Flexion 5/5    Right Knee Extension 5/5    Left Knee Flexion 5/5    Left Knee Extension 5/5    Right Ankle Dorsiflexion 4+/5    Left Ankle Dorsiflexion 4+/5      Transfers   Five time sit to stand comments  10 seconds without UE support     Dynamic Gait Index   Level Surface Mild Impairment    Change in Gait Speed Mild Impairment    Gait with Horizontal Head Turns Normal    Gait with Vertical Head Turns Normal    Gait and Pivot Turn Normal    Step Over Obstacle Mild Impairment    Step Around Obstacles Mild Impairment    Steps Mild Impairment    Total Score 19                         [facility] Adult PT Treatment/Exercise - 10/23/19 0001      Knee/Hip Exercises: Standing   SLS with Vectors 2x5 5 second holds    Other Standing Knee Exercises 6 inch hurdles 4 RT forward 2 RT lateral     Other Standing Knee Exercises tandem  gait 4x15 feet                  PT Education - 10/23/19 1033    Education Details Review of HEP, mechanics of exercise, Performing Hep, building endurance    Person(s) Educated Patient;Child(ren)    Methods Explanation;Demonstration    Comprehension Verbalized understanding;Returned demonstration            PT Short Term Goals - 10/23/19 1037      PT SHORT TERM GOAL #1   Title Patient will be independent with initial HEP and self-management strategies to improve functional outcomes    Time 4    Period Weeks    Status On-going    Target Date 10/18/19      PT SHORT TERM GOAL #2   Title Patient will be able to perform stand x 5 (with improved form and less reliance on UEs)  in < 15 seconds to demonstrate improvement in functional mobility and reduced risk for falls.    Time 4    Period Weeks    Status Achieved    Target Date 10/18/19             PT Long Term Goals - 10/23/19 1037      PT LONG TERM GOAL #1   Title Patient will have equal to or > 4+/5 MMT throughout BLE to improve ability to perform functional mobility, stair ambulation and ADLs.    Time 8    Period Weeks    Status Achieved      PT LONG TERM GOAL #2   Title Patient will report at least 50% overall improvement in subjective complaint to indicate improvement in ability to perform ADLs.    Time 8    Period Weeks    Status On-going      PT LONG TERM GOAL #3   Title Patient will improve DGI by at least 4 points to demonstrate improvement in functional mobility and reduced risk for falls.    Time 8    Period Weeks    Status Achieved      PT LONG TERM GOAL #4   Title Patient will report improved confidence with ambulating on uneven surface (outside, around yard) with LRAD, with  no recent falls, to demo improved functional mobility and ability to perform ADLs .    Time 8    Period Weeks    Status Achieved                 Plan - 10/23/19 1033    Clinical Impression Statement Patient  has met 1/2 short term goals with improved functional strength but did not meet remaining short term goal due to non-compliance with HEP. Patient has met 3/4 short term goals with improved strength, balance and confidence with ambulation. He continues to have difficulty with strength, balance, and endurance leading to remaining goal not met at this time. Patient demonstrates impaired dynamic balance today during DGI indicating increased risk for falling. Patient continues to fatigue quickly secondary to impaired activity tolerance and endurance. Patient will continue to benefit from skilled physical therapy in order to reduce impairment and improve function.    Personal Factors and Comorbidities Comorbidity 2;Age    Comorbidities hypotension, DM, Hx of cancer    Examination-Activity Limitations Stand;Locomotion Level;Bend;Transfers;Stairs    Examination-Participation Restrictions Art gallery manager;Yard Work    Merchant navy officer Evolving/Moderate complexity    Rehab Potential Fair    PT Frequency 2x / week    PT Duration 8 weeks    PT Treatment/Interventions Aquatic Therapy;Biofeedback;Canalith Repostioning;Cryotherapy;Electrical Stimulation;Contrast Bath;Fluidtherapy;Therapeutic activities;Therapeutic exercise;Orthotic Fit/Training;Patient/family education;Manual lymph drainage;Compression bandaging;Taping;Vasopneumatic Device;Joint Manipulations;Splinting;Spinal Manipulations;Energy conservation;Dry needling;Manual techniques;Wheelchair mobility training;Passive range of motion;Scar mobilization;Prosthetic Training;Vestibular;Visual/perceptual remediation/compensation;Balance training;DME Instruction;Iontophoresis 75m/ml Dexamethasone;Gait training;ADLs/Self Care Home Management;Neuromuscular re-education;Stair training;Moist Heat;Traction;Functional mobility training;Ultrasound;Parrafin;Cognitive remediation    PT Next Visit Plan Progress LE strengthening and static balance  activity as able. Continue dynamic balance.    PT Home Exercise Plan 6/16:  tandem stance, hip abd/ext, sit to stands, lateral visual tracking 6/22 seated marching, LAQ    Consulted and Agree with Plan of Care Patient    Family Member Consulted daughter           Patient will benefit from skilled therapeutic intervention in order to improve the following deficits and impairments:  Abnormal gait, Improper body mechanics, Dizziness, Postural dysfunction, Decreased activity tolerance, Decreased endurance, Decreased strength, Decreased balance, Decreased safety awareness, Difficulty walking  Visit Diagnosis: Dizziness and giddiness  Other abnormalities of gait and mobility  Muscle weakness (generalized)     Problem List There are no problems to display for this patient.   11:35 AM, 10/23/19 AMearl LatinPT, DPT Physical Therapist [place] [address]Phone: [phone]  Fax:  [phone] Name: JHORTON ELLITHORPEMRN: [id number] of Birth: [birth year]

## 2019-10-28 ENCOUNTER — Ambulatory Visit (HOSPITAL_COMMUNITY): Payer: Medicare Other | Admitting: Physical Therapy

## 2019-10-28 ENCOUNTER — Other Ambulatory Visit: Payer: Self-pay

## 2019-10-28 DIAGNOSIS — M6281 Muscle weakness (generalized): Secondary | ICD-10-CM

## 2019-10-28 DIAGNOSIS — R2689 Other abnormalities of gait and mobility: Secondary | ICD-10-CM | POA: Diagnosis not present

## 2019-10-28 DIAGNOSIS — R42 Dizziness and giddiness: Secondary | ICD-10-CM | POA: Diagnosis not present

## 2019-10-28 NOTE — Therapy (Signed)
[place] [address] [place], [place], [address] Phone: [phone]   Fax:  [phone]  Physical Therapy Treatment  Patient Details  Name: Eric Brady MRN: [id number] Date of Birth: 11/16/1932 Referring Provider (PT): Asencion Noble MD    Encounter Date: 10/28/2019   PT End of Session - 10/28/19 1016    Visit Number 11    Number of Visits 16    Date for PT Re-Evaluation 11/15/19    Authorization Type BCBS Medicare (no auth, no VL)    Progress Note Due on Visit 20    PT Start Time 0834    PT Stop Time 0918    PT Time Calculation (min) 44 min    Equipment Utilized During Treatment Gait belt    Activity Tolerance Patient tolerated treatment well;Patient limited by fatigue    Behavior During Therapy [facility] for tasks assessed/performed           Past Medical History:  Diagnosis Date  . Anemia   . Cancer [facility])    Prostate  . Diabetes mellitus without complication ([place])   . Hyperlipidemia   . Squamous cell carcinoma of skin 08/12/2019   in situ on right ear rim(CX3&EXC)    Past Surgical History:  Procedure Laterality Date  . COLONOSCOPY N/A 09/12/2012   Procedure: COLONOSCOPY;  Surgeon: Rogene Houston, MD;  Location: [facility];  Service: Endoscopy;  Laterality: N/A;  915  . EYE SURGERY    . ORIF ANKLE FRACTURE Right   . YAG LASER APPLICATION Left 11/11/1749   Procedure: YAG LASER APPLICATION;  Surgeon: Williams Che, MD;  Location: [facility] ORS;  Service: Ophthalmology;  Laterality: Left;    There were no vitals filed for this visit.   Subjective Assessment - 10/28/19 0837    Subjective pt admits to still not doing his HEP.  States he just doesnt feel like it at home.  Daughter states she can tell an improvement and when they are with him they assist him in completing his HEP.                             [facility] Adult PT Treatment/Exercise - 10/28/19 0001      Knee/Hip Exercises: Standing   Heel  Raises Both;15 reps    SLS with Vectors 2 sets 5 reps 5 second holds    Other Standing Knee Exercises 6 inch hurdles 4 RT forward 2 RT lateral     Other Standing Knee Exercises tandem gait 2RT, retro gait 2RT               Balance Exercises - 10/28/19 0001      Balance Exercises: Standing   Gait with Head Turns Forward;3 reps    Tandem Gait Forward;3 reps    Marching Foam/compliant surface;20 reps;Intermittent upper extremity assist    Other Standing Exercises tandem on foam with head turns 2 x 10 horizontal and vertical each     Other Standing Exercises Comments tandem on foam with 2# UE flexion 10reps each               PT Short Term Goals - 10/23/19 1037      PT SHORT TERM GOAL #1   Title Patient will be independent with initial HEP and self-management strategies to improve functional outcomes    Time 4    Period Weeks    Status On-going    Target  Date 10/18/19      PT SHORT TERM GOAL #2   Title Patient will be able to perform stand x 5 (with improved form and less reliance on UEs)  in < 15 seconds to demonstrate improvement in functional mobility and reduced risk for falls.    Time 4    Period Weeks    Status Achieved    Target Date 10/18/19             PT Long Term Goals - 10/23/19 1037      PT LONG TERM GOAL #1   Title Patient will have equal to or > 4+/5 MMT throughout BLE to improve ability to perform functional mobility, stair ambulation and ADLs.    Time 8    Period Weeks    Status Achieved      PT LONG TERM GOAL #2   Title Patient will report at least 50% overall improvement in subjective complaint to indicate improvement in ability to perform ADLs.    Time 8    Period Weeks    Status On-going      PT LONG TERM GOAL #3   Title Patient will improve DGI by at least 4 points to demonstrate improvement in functional mobility and reduced risk for falls.    Time 8    Period Weeks    Status Achieved      PT LONG TERM GOAL #4   Title Patient  will report improved confidence with ambulating on uneven surface (outside, around yard) with LRAD, with no recent falls, to demo improved functional mobility and ability to perform ADLs .    Time 8    Period Weeks    Status Achieved                 Plan - 10/28/19 1017    Clinical Impression Statement Continued with focus on improving LE strength and stability.  Pt required frequent seated rest breaks during session today due to c/o dizziness.  Pt reports this is no worse or different from usual, however short seated breaks seems to lessen dizziness.  Pt requires cues to keep forward gaze during activities instead of looking down.  Increased difficulty of tandem activity with addition of UE flexion.    Personal Factors and Comorbidities Comorbidity 2;Age    Comorbidities hypotension, DM, Hx of cancer    Examination-Activity Limitations Stand;Locomotion Level;Bend;Transfers;Stairs    Examination-Participation Restrictions Art gallery manager;Yard Work    Merchant navy officer Evolving/Moderate complexity    Rehab Potential Fair    PT Frequency 2x / week    PT Duration 8 weeks    PT Treatment/Interventions Aquatic Therapy;Biofeedback;Canalith Repostioning;Cryotherapy;Electrical Stimulation;Contrast Bath;Fluidtherapy;Therapeutic activities;Therapeutic exercise;Orthotic Fit/Training;Patient/family education;Manual lymph drainage;Compression bandaging;Taping;Vasopneumatic Device;Joint Manipulations;Splinting;Spinal Manipulations;Energy conservation;Dry needling;Manual techniques;Wheelchair mobility training;Passive range of motion;Scar mobilization;Prosthetic Training;Vestibular;Visual/perceptual remediation/compensation;Balance training;DME Instruction;Iontophoresis 4mg /ml Dexamethasone;Gait training;ADLs/Self Care Home Management;Neuromuscular re-education;Stair training;Moist Heat;Traction;Functional mobility training;Ultrasound;Parrafin;Cognitive remediation    PT Next  Visit Plan Progress LE strengthening and  dynamic balance.    PT Home Exercise Plan 6/16:  tandem stance, hip abd/ext, sit to stands, lateral visual tracking 6/22 seated marching, LAQ    Consulted and Agree with Plan of Care Patient    Family Member Consulted daughter           Patient will benefit from skilled therapeutic intervention in order to improve the following deficits and impairments:  Abnormal gait, Improper body mechanics, Dizziness, Postural dysfunction, Decreased activity tolerance, Decreased endurance, Decreased strength, Decreased balance, Decreased safety awareness, Difficulty walking  Visit Diagnosis:  Other abnormalities of gait and mobility  Muscle weakness (generalized)  Dizziness and giddiness     Problem List There are no problems to display for this patient.  Teena Irani, PTA/CLT [phone]  Teena Irani 10/28/2019, 10:19 AM  [place] [address] [place], [place], [address] Phone: [phone]   Fax:  [phone]  Name: TEVYN CODD MRN: [id number] Date of Birth: 02-22-1933

## 2019-10-30 ENCOUNTER — Ambulatory Visit (HOSPITAL_COMMUNITY): Payer: Medicare Other | Admitting: Physical Therapy

## 2019-10-30 ENCOUNTER — Other Ambulatory Visit: Payer: Self-pay

## 2019-10-30 DIAGNOSIS — M6281 Muscle weakness (generalized): Secondary | ICD-10-CM

## 2019-10-30 DIAGNOSIS — R2689 Other abnormalities of gait and mobility: Secondary | ICD-10-CM | POA: Diagnosis not present

## 2019-10-30 DIAGNOSIS — R42 Dizziness and giddiness: Secondary | ICD-10-CM | POA: Diagnosis not present

## 2019-10-30 DIAGNOSIS — S2096XA Insect bite (nonvenomous) of unspecified parts of thorax, initial encounter: Secondary | ICD-10-CM | POA: Diagnosis not present

## 2019-10-30 NOTE — Therapy (Signed)
[place], [place], [address] Phone: [phone]   Fax:  [phone]  Physical Therapy Treatment  Patient Details  Name: Eric Brady MRN: [id number] Date of Birth: 1932-12-27 Referring Provider (PT): Asencion Noble MD    Encounter Date: 10/30/2019   PT End of Session - 10/30/19 1009    Visit Number 12    Number of Visits 16    Date for PT Re-Evaluation 11/15/19    Authorization Type BCBS Medicare (no auth, no VL)    Progress Note Due on Visit 20    PT Start Time 0830    PT Stop Time 0918    PT Time Calculation (min) 48 min    Equipment Utilized During Treatment Gait belt    Activity Tolerance Patient tolerated treatment well;Patient limited by fatigue    Behavior During Therapy [facility] for tasks assessed/performed           Past Medical History:  Diagnosis Date  . Anemia   . Cancer [facility])    Prostate  . Diabetes mellitus without complication ([place])   . Hyperlipidemia   . Squamous cell carcinoma of skin 08/12/2019   in situ on right ear rim(CX3&EXC)    Past Surgical History:  Procedure Laterality Date  . COLONOSCOPY N/A 09/12/2012   Procedure: COLONOSCOPY;  Surgeon: Rogene Houston, MD;  [facility];  Service: Endoscopy;  Laterality: N/A;  915  . EYE SURGERY    . ORIF ANKLE FRACTURE Right   . YAG LASER APPLICATION Left 7/51/0258   Procedure: YAG LASER APPLICATION;  Surgeon: Williams Che, MD;  Location: [facility] ORS;  Service: Ophthalmology;  Laterality: Left;    There were no vitals filed for this visit.   Subjective Assessment - 10/30/19 0833    Subjective Pt states he's been doing well without soreness or issues.    Currently in Pain? No/denies                             [facility] Adult PT Treatment/Exercise - 10/30/19 0001      Knee/Hip Exercises: Standing   Heel Raises Both;15 reps    Forward Lunges Both;10 reps;Limitations    Forward Lunges Limitations onto 4" step  without UE asisst    SLS with Vectors 2 sets 5 reps 5 second holds    Other Standing Knee Exercises 2 6" hurdles/2 12" hurdles 4 RT forward 2 RT lateral     Other Standing Knee Exercises tandem gait 2RT, retro gait 2RT                    PT Short Term Goals - 10/23/19 1037      PT SHORT TERM GOAL #1   Title Patient will be independent with initial HEP and self-management strategies to improve functional outcomes    Time 4    Period Weeks    Status On-going    Target Date 10/18/19      PT SHORT TERM GOAL #2   Title Patient will be able to perform stand x 5 (with improved form and less reliance on UEs)  in < 15 seconds to demonstrate improvement in functional mobility and reduced risk for falls.    Time 4    Period Weeks    Status Achieved    Target Date 10/18/19             PT Long Term  Goals - 10/23/19 1037      PT LONG TERM GOAL #1   Title Patient will have equal to or > 4+/5 MMT throughout BLE to improve ability to perform functional mobility, stair ambulation and ADLs.    Time 8    Period Weeks    Status Achieved      PT LONG TERM GOAL #2   Title Patient will report at least 50% overall improvement in subjective complaint to indicate improvement in ability to perform ADLs.    Time 8    Period Weeks    Status On-going      PT LONG TERM GOAL #3   Title Patient will improve DGI by at least 4 points to demonstrate improvement in functional mobility and reduced risk for falls.    Time 8    Period Weeks    Status Achieved      PT LONG TERM GOAL #4   Title Patient will report improved confidence with ambulating on uneven surface (outside, around yard) with LRAD, with no recent falls, to demo improved functional mobility and ability to perform ADLs .    Time 8    Period Weeks    Status Achieved                 Plan - 10/30/19 4097    Clinical Impression Statement continued with focus on improving balance, activity tolerance and LE strenght.  Pt  continues to require frequent, short sitting breaks (approx 15-25 seconds in length) between each actvitiy.  Added forward lunges without UE assist to work on single leg stablity.   Encouraged pateint and son to hydrate more during the day as he reports he does not drink enough fluids.    Personal Factors and Comorbidities Comorbidity 2;Age    Comorbidities hypotension, DM, Hx of cancer    Examination-Activity Limitations Stand;Locomotion Level;Bend;Transfers;Stairs    Examination-Participation Restrictions Art gallery manager;Yard Work    Merchant [employer] officer Evolving/Moderate complexity    Rehab Potential Fair    PT Frequency 2x / week    PT Duration 8 weeks    PT Treatment/Interventions Aquatic Therapy;Biofeedback;Canalith Repostioning;Cryotherapy;Electrical Stimulation;Contrast Bath;Fluidtherapy;Therapeutic activities;Therapeutic exercise;Orthotic Fit/Training;Patient/family education;Manual lymph drainage;Compression bandaging;Taping;Vasopneumatic Device;Joint Manipulations;Splinting;Spinal Manipulations;Energy conservation;Dry needling;Manual techniques;Wheelchair mobility training;Passive range of motion;Scar mobilization;Prosthetic Training;Vestibular;Visual/perceptual remediation/compensation;Balance training;DME Instruction;Iontophoresis 4mg /ml Dexamethasone;Gait training;ADLs/Self Care Home Management;Neuromuscular re-education;Stair training;Moist Heat;Traction;Functional mobility training;Ultrasound;Parrafin;Cognitive remediation    PT Next Visit Plan Progress LE strengthening and  dynamic balance.    PT Home Exercise Plan 6/16:  tandem stance, hip abd/ext, sit to stands, lateral visual tracking 6/22 seated marching, LAQ    Consulted and Agree with Plan of Care Patient    Family Member Consulted daughter           Patient will benefit from skilled therapeutic intervention in order to improve the following deficits and impairments:  Abnormal gait, Improper body  mechanics, Dizziness, Postural dysfunction, Decreased activity tolerance, Decreased endurance, Decreased strength, Decreased balance, Decreased safety awareness, Difficulty walking  Visit Diagnosis: Muscle weakness (generalized)  Dizziness and giddiness  Other abnormalities of gait and mobility     Problem List There are no problems to display for this patient.  Eric Brady, PTA/CLT [phone]  Eric Brady 10/30/2019, 10:10 AM  [place], [place], [address] Phone: [phone]   Fax:  [phone]  Name: Eric Brady MRN: [id number] Date of Birth: 1932/11/20

## 2019-11-04 ENCOUNTER — Encounter (HOSPITAL_COMMUNITY): Payer: Self-pay | Admitting: Physical Therapy

## 2019-11-04 ENCOUNTER — Ambulatory Visit (HOSPITAL_COMMUNITY): Payer: Medicare Other | Attending: Internal Medicine | Admitting: Physical Therapy

## 2019-11-04 ENCOUNTER — Other Ambulatory Visit: Payer: Self-pay

## 2019-11-04 DIAGNOSIS — R42 Dizziness and giddiness: Secondary | ICD-10-CM | POA: Diagnosis not present

## 2019-11-04 DIAGNOSIS — R2689 Other abnormalities of gait and mobility: Secondary | ICD-10-CM | POA: Insufficient documentation

## 2019-11-04 DIAGNOSIS — M6281 Muscle weakness (generalized): Secondary | ICD-10-CM | POA: Diagnosis not present

## 2019-11-05 DIAGNOSIS — E538 Deficiency of other specified B group vitamins: Secondary | ICD-10-CM | POA: Diagnosis not present

## 2019-11-05 NOTE — Therapy (Signed)
[place], [place], [address] Phone: [phone]   Fax:  [phone]  Physical Therapy Treatment  Patient Details  Name: Eric Brady MRN: [id number] Date of Birth: 06/18/1932 Referring Provider (PT): Asencion Noble MD    Encounter Date: 11/04/2019   PT End of Session - 11/04/19 1044    Visit Number 13    Number of Visits 16    Date for PT Re-Evaluation 11/15/19    Authorization Type BCBS Medicare (no auth, no VL)    Progress Note Due on Visit 20    PT Start Time 1045    PT Stop Time 1115    PT Time Calculation (min) 30 min    Equipment Utilized During Treatment Gait belt    Activity Tolerance Patient tolerated treatment well    Behavior During Therapy [facility] for tasks assessed/performed           Past Medical History:  Diagnosis Date  . Anemia   . Cancer [facility])    Prostate  . Diabetes mellitus without complication ([place])   . Hyperlipidemia   . Squamous cell carcinoma of skin 08/12/2019   in situ on right ear rim(CX3&EXC)    Past Surgical History:  Procedure Laterality Date  . COLONOSCOPY N/A 09/12/2012   Procedure: COLONOSCOPY;  Surgeon: Rogene Houston, MD;  [facility];  Service: Endoscopy;  Laterality: N/A;  915  . EYE SURGERY    . ORIF ANKLE FRACTURE Right   . YAG LASER APPLICATION Left 07/12/7351   Procedure: YAG LASER APPLICATION;  Surgeon: Williams Che, MD;  Location: [facility];  Service: Ophthalmology;  Laterality: Left;    There were no vitals filed for this visit.   Subjective Assessment - 11/04/19 1047    Subjective Patient reports no new issues, other than noting some cramping in his legs last Wednesday.    Currently in Pain? No/denies                11/04/19 0001  Balance Exercises: Standing  Gait with Head Turns Forward (2 x horizontal; 2 x vertical )  Tandem Gait Forward;3 reps  Retro Gait 3 reps  Sidestepping 3 reps  Other Standing Exercises ladder drills  (slow pace) forward reciprocal, lateral, box stepping 3RT each         PT Short Term Goals - 10/23/19 1037      PT SHORT TERM GOAL #1   Title Patient will be independent with initial HEP and self-management strategies to improve functional outcomes    Time 4    Period Weeks    Status On-going    Target Date 10/18/19      PT SHORT TERM GOAL #2   Title Patient will be able to perform stand x 5 (with improved form and less reliance on UEs)  in < 15 seconds to demonstrate improvement in functional mobility and reduced risk for falls.    Time 4    Period Weeks    Status Achieved    Target Date 10/18/19             PT Long Term Goals - 10/23/19 1037      PT LONG TERM GOAL #1   Title Patient will have equal to or > 4+/5 MMT throughout BLE to improve ability to perform functional mobility, stair ambulation and ADLs.    Time 8    Period Weeks    Status Achieved  PT LONG TERM GOAL #2   Title Patient will report at least 50% overall improvement in subjective complaint to indicate improvement in ability to perform ADLs.    Time 8    Period Weeks    Status On-going      PT LONG TERM GOAL #3   Title Patient will improve DGI by at least 4 points to demonstrate improvement in functional mobility and reduced risk for falls.    Time 8    Period Weeks    Status Achieved      PT LONG TERM GOAL #4   Title Patient will report improved confidence with ambulating on uneven surface (outside, around yard) with LRAD, with no recent falls, to demo improved functional mobility and ability to perform ADLs .    Time 8    Period Weeks    Status Achieved                 Plan - 11/05/19 0847    Clinical Impression Statement Patient shows some improvement with dynamic balance and LE sequencing today. Added ladder drills to focus on BLE coordination and sequencing. Patient required mod verbal cues for proper sequencing of ladder drills, but did show improvement with practice. Patient  notes intermittent dizziness with turning during dynamic gait activity, but subsides with rest. Patient reports no symptoms of dizziness post treatment. Patient will continue to benefit from skilled therapy services to progress dynamic balance and functional LE strength to improve functional mobility and reduce risk for falls.    Personal Factors and Comorbidities Comorbidity 2;Age    Comorbidities hypotension, DM, Hx of cancer    Examination-Activity Limitations Stand;Locomotion Level;Bend;Transfers;Stairs    Examination-Participation Restrictions Art gallery manager;Yard Work    Merchant navy officer Evolving/Moderate complexity    Rehab Potential Fair    PT Frequency 2x / week    PT Duration 8 weeks    PT Treatment/Interventions Aquatic Therapy;Biofeedback;Canalith Repostioning;Cryotherapy;Electrical Stimulation;Contrast Bath;Fluidtherapy;Therapeutic activities;Therapeutic exercise;Orthotic Fit/Training;Patient/family education;Manual lymph drainage;Compression bandaging;Taping;Vasopneumatic Device;Joint Manipulations;Splinting;Spinal Manipulations;Energy conservation;Dry needling;Manual techniques;Wheelchair mobility training;Passive range of motion;Scar mobilization;Prosthetic Training;Vestibular;Visual/perceptual remediation/compensation;Balance training;DME Instruction;Iontophoresis 4mg /ml Dexamethasone;Gait training;ADLs/Self Care Home Management;Neuromuscular re-education;Stair training;Moist Heat;Traction;Functional mobility training;Ultrasound;Parrafin;Cognitive remediation    PT Next Visit Plan Continue work with dynamaic balance and LE sequencing using ladder. Try to incoorperate more work on compliant surface    PT Home Exercise Plan 6/16:  tandem stance, hip abd/ext, sit to stands, lateral visual tracking 6/22 seated marching, LAQ    Consulted and Agree with Plan of Care Patient    Family Member Consulted daughter           Patient will benefit from skilled  therapeutic intervention in order to improve the following deficits and impairments:  Abnormal gait, Improper body mechanics, Dizziness, Postural dysfunction, Decreased activity tolerance, Decreased endurance, Decreased strength, Decreased balance, Decreased safety awareness, Difficulty walking  Visit Diagnosis: Muscle weakness (generalized)  Dizziness and giddiness  Other abnormalities of gait and mobility     Problem List There are no problems to display for this patient.   8:53 AM, 11/05/19 Josue Hector PT DPT  Physical Therapist with [facility]  [phone] [place] [address] [place], [place], [address] Phone: [phone]   Fax:  [phone]  Name: TREDARIUS COBERN MRN: [id number] Date of Birth: 23-Apr-1932

## 2019-11-06 ENCOUNTER — Ambulatory Visit: Payer: Medicare Other | Admitting: Dermatology

## 2019-11-06 ENCOUNTER — Other Ambulatory Visit: Payer: Self-pay

## 2019-11-06 DIAGNOSIS — Z85828 Personal history of other malignant neoplasm of skin: Secondary | ICD-10-CM

## 2019-11-06 DIAGNOSIS — L729 Follicular cyst of the skin and subcutaneous tissue, unspecified: Secondary | ICD-10-CM | POA: Diagnosis not present

## 2019-11-06 DIAGNOSIS — L570 Actinic keratosis: Secondary | ICD-10-CM | POA: Diagnosis not present

## 2019-11-07 ENCOUNTER — Ambulatory Visit (HOSPITAL_COMMUNITY): Payer: Medicare Other | Admitting: Physical Therapy

## 2019-11-07 DIAGNOSIS — R42 Dizziness and giddiness: Secondary | ICD-10-CM | POA: Diagnosis not present

## 2019-11-07 DIAGNOSIS — R2689 Other abnormalities of gait and mobility: Secondary | ICD-10-CM

## 2019-11-07 DIAGNOSIS — M6281 Muscle weakness (generalized): Secondary | ICD-10-CM

## 2019-11-07 NOTE — Therapy (Signed)
[place], [place], [address] Phone: [phone]   Fax:  [phone]  Physical Therapy Treatment  Patient Details  Name: Eric Brady MRN: [id number] Date of Birth: March 07, 1933 Referring Provider (PT): Asencion Noble MD    Encounter Date: 11/07/2019   PT End of Session - 11/07/19 1115    Visit Number 14    Number of Visits 16    Date for PT Re-Evaluation 11/15/19    Authorization Type BCBS Medicare (no auth, no VL)    Progress Note Due on Visit 20    PT Start Time 0920    PT Stop Time 1000    PT Time Calculation (min) 40 min    Equipment Utilized During Treatment Gait belt    Activity Tolerance Patient tolerated treatment well    Behavior During Therapy [facility] for tasks assessed/performed           Past Medical History:  Diagnosis Date  . Anemia   . Cancer [facility])    Prostate  . Diabetes mellitus without complication ([place])   . Hyperlipidemia   . Squamous cell carcinoma of skin 08/12/2019   in situ on right ear rim(CX3&EXC)    Past Surgical History:  Procedure Laterality Date  . COLONOSCOPY N/A 09/12/2012   Procedure: COLONOSCOPY;  Surgeon: Rogene Houston, MD;  [facility];  Service: Endoscopy;  Laterality: N/A;  915  . EYE SURGERY    . ORIF ANKLE FRACTURE Right   . YAG LASER APPLICATION Left 4/46/2863   Procedure: YAG LASER APPLICATION;  Surgeon: Williams Che, MD;  Location: [facility];  Service: Ophthalmology;  Laterality: Left;    There were no vitals filed for this visit.   Subjective Assessment - 11/07/19 0924    Subjective Pt states his dizziness has gotten worse in the last 3 days.  Requests we "go easy" today.    Currently in Pain? No/denies                             [facility] Adult PT Treatment/Exercise - 11/07/19 0001      Knee/Hip Exercises: Standing   Heel Raises Both;15 reps    Forward Lunges Both;Limitations;15 reps    Forward Lunges Limitations onto 4"  step without UE asisst    SLS with Vectors 2 sets 5 reps 5 second holds    Gait Training ladder drills reciprocal fwd, lateral step to and in/out 1 RT each    Other Standing Knee Exercises tandem gait 2RT, retro gait 2RT                    PT Short Term Goals - 10/23/19 1037      PT SHORT TERM GOAL #1   Title Patient will be independent with initial HEP and self-management strategies to improve functional outcomes    Time 4    Period Weeks    Status On-going    Target Date 10/18/19      PT SHORT TERM GOAL #2   Title Patient will be able to perform stand x 5 (with improved form and less reliance on UEs)  in < 15 seconds to demonstrate improvement in functional mobility and reduced risk for falls.    Time 4    Period Weeks    Status Achieved    Target Date 10/18/19  PT Long Term Goals - 10/23/19 1037      PT LONG TERM GOAL #1   Title Patient will have equal to or > 4+/5 MMT throughout BLE to improve ability to perform functional mobility, stair ambulation and ADLs.    Time 8    Period Weeks    Status Achieved      PT LONG TERM GOAL #2   Title Patient will report at least 50% overall improvement in subjective complaint to indicate improvement in ability to perform ADLs.    Time 8    Period Weeks    Status On-going      PT LONG TERM GOAL #3   Title Patient will improve DGI by at least 4 points to demonstrate improvement in functional mobility and reduced risk for falls.    Time 8    Period Weeks    Status Achieved      PT LONG TERM GOAL #4   Title Patient will report improved confidence with ambulating on uneven surface (outside, around yard) with LRAD, with no recent falls, to demo improved functional mobility and ability to perform ADLs .    Time 8    Period Weeks    Status Achieved                 Plan - 11/07/19 1116    Clinical Impression Statement Continued with focus on improving balance and LE strength/activity tolerance.  Pt  continues to require rest breaks, however did not need as many today as compared to other sessions.  Son reports they are awaiting results of blood tests to rule out Alpha-gal and/or Lymes as his energy and dizziness persist despite all other efforts.  Continued with ladder drills with constant cues for sequencing.    Personal Factors and Comorbidities Comorbidity 2;Age    Comorbidities hypotension, DM, Hx of cancer    Examination-Activity Limitations Stand;Locomotion Level;Bend;Transfers;Stairs    Examination-Participation Restrictions Art gallery manager;Yard Work    Merchant navy officer Evolving/Moderate complexity    Rehab Potential Fair    PT Frequency 2x / week    PT Duration 8 weeks    PT Treatment/Interventions Aquatic Therapy;Biofeedback;Canalith Repostioning;Cryotherapy;Electrical Stimulation;Contrast Bath;Fluidtherapy;Therapeutic activities;Therapeutic exercise;Orthotic Fit/Training;Patient/family education;Manual lymph drainage;Compression bandaging;Taping;Vasopneumatic Device;Joint Manipulations;Splinting;Spinal Manipulations;Energy conservation;Dry needling;Manual techniques;Wheelchair mobility training;Passive range of motion;Scar mobilization;Prosthetic Training;Vestibular;Visual/perceptual remediation/compensation;Balance training;DME Instruction;Iontophoresis 4mg /ml Dexamethasone;Gait training;ADLs/Self Care Home Management;Neuromuscular re-education;Stair training;Moist Heat;Traction;Functional mobility training;Ultrasound;Parrafin;Cognitive remediation    PT Next Visit Plan Continue work with dynamaic balance and LE sequencing using ladder. Try to incoorperate more work on compliant surface    PT Home Exercise Plan 6/16:  tandem stance, hip abd/ext, sit to stands, lateral visual tracking 6/22 seated marching, LAQ    Consulted and Agree with Plan of Care Patient    Family Member Consulted daughter           Patient will benefit from skilled therapeutic  intervention in order to improve the following deficits and impairments:  Abnormal gait, Improper body mechanics, Dizziness, Postural dysfunction, Decreased activity tolerance, Decreased endurance, Decreased strength, Decreased balance, Decreased safety awareness, Difficulty walking  Visit Diagnosis: Other abnormalities of gait and mobility  Dizziness and giddiness  Muscle weakness (generalized)     Problem List There are no problems to display for this patient.  Teena Irani, PTA/CLT [phone]  Teena Irani 11/07/2019, 11:16 AM  [place], [place], [address] Phone: [phone]   Fax:  [phone]  Name: Eric Brady MRN: [id number] Date  of Birth: [birth year]

## 2019-11-11 ENCOUNTER — Ambulatory Visit (HOSPITAL_COMMUNITY): Payer: Medicare Other | Admitting: Physical Therapy

## 2019-11-11 ENCOUNTER — Encounter (HOSPITAL_COMMUNITY): Payer: Self-pay | Admitting: Physical Therapy

## 2019-11-11 ENCOUNTER — Other Ambulatory Visit: Payer: Self-pay

## 2019-11-11 DIAGNOSIS — R42 Dizziness and giddiness: Secondary | ICD-10-CM | POA: Diagnosis not present

## 2019-11-11 DIAGNOSIS — M6281 Muscle weakness (generalized): Secondary | ICD-10-CM

## 2019-11-11 DIAGNOSIS — R2689 Other abnormalities of gait and mobility: Secondary | ICD-10-CM

## 2019-11-11 NOTE — Therapy (Signed)
[facility] [address] [place], [place], [address] Phone: [phone]   Fax:  [phone]  Physical Therapy Treatment  Patient Details  Name: Eric Brady MRN: [id number] Date of Birth: 09/19/1932 Referring Provider (PT): Asencion Noble MD    Encounter Date: 11/11/2019   PT End of Session - 11/11/19 0852    Visit Number 15    Number of Visits 16    Date for PT Re-Evaluation 11/15/19    Authorization Type BCBS Medicare (no auth, no VL)    Progress Note Due on Visit 20    PT Start Time 0856    PT Stop Time 0938    PT Time Calculation (min) 42 min    Equipment Utilized During Treatment Gait belt    Activity Tolerance Patient tolerated treatment well;Patient limited by fatigue    Behavior During Therapy [facility] for tasks assessed/performed           Past Medical History:  Diagnosis Date  . Anemia   . Cancer [facility])    Prostate  . Diabetes mellitus without complication ([place])   . Hyperlipidemia   . Squamous cell carcinoma of skin 08/12/2019   in situ on right ear rim(CX3&EXC)    Past Surgical History:  Procedure Laterality Date  . COLONOSCOPY N/A 09/12/2012   Procedure: COLONOSCOPY;  Surgeon: Rogene Houston, MD;  Location: [facility];  Service: Endoscopy;  Laterality: N/A;  915  . EYE SURGERY    . ORIF ANKLE FRACTURE Right   . YAG LASER APPLICATION Left 0/25/4270   Procedure: YAG LASER APPLICATION;  Surgeon: Williams Che, MD;  Location: [facility] ORS;  Service: Ophthalmology;  Laterality: Left;    There were no vitals filed for this visit.   Subjective Assessment - 11/11/19 0900    Subjective Patient reports no new issues. Says he feels better at times.    Patient is accompained by: Family member   Daughter   Currently in Pain? No/denies                             [facility] Adult PT Treatment/Exercise - 11/11/19 0001      Knee/Hip Exercises: Standing   Stairs 7 inch, single rail, reciprocal gait, 4 RT         Knee/Hip Exercises: Seated   Sit to Sand without UE support;20 reps               Balance Exercises - 11/11/19 0001      Balance Exercises: Standing   Standing Eyes Opened Narrow base of support (BOS);Head turns;Foam/compliant surface   10 x horiz; 10x vertical    Balance Beam 2RT tandem, 2RT sidestepping     Gait with Head Turns Forward   2 x horiz; 2 x vertical    Tandem Gait Forward;2 reps    Retro Gait 2 reps    Sidestepping 2 reps    Other Standing Exercises ladder drills (slow pace) lateral 2RT, box stepping 1RT                 PT Short Term Goals - 10/23/19 1037      PT SHORT TERM GOAL #1   Title Patient will be independent with initial HEP and self-management strategies to improve functional outcomes    Time 4    Period Weeks    Status On-going    Target Date 10/18/19      PT  SHORT TERM GOAL #2   Title Patient will be able to perform stand x 5 (with improved form and less reliance on UEs)  in < 15 seconds to demonstrate improvement in functional mobility and reduced risk for falls.    Time 4    Period Weeks    Status Achieved    Target Date 10/18/19             PT Long Term Goals - 10/23/19 1037      PT LONG TERM GOAL #1   Title Patient will have equal to or > 4+/5 MMT throughout BLE to improve ability to perform functional mobility, stair ambulation and ADLs.    Time 8    Period Weeks    Status Achieved      PT LONG TERM GOAL #2   Title Patient will report at least 50% overall improvement in subjective complaint to indicate improvement in ability to perform ADLs.    Time 8    Period Weeks    Status On-going      PT LONG TERM GOAL #3   Title Patient will improve DGI by at least 4 points to demonstrate improvement in functional mobility and reduced risk for falls.    Time 8    Period Weeks    Status Achieved      PT LONG TERM GOAL #4   Title Patient will report improved confidence with ambulating on uneven surface (outside, around yard)  with LRAD, with no recent falls, to demo improved functional mobility and ability to perform ADLs .    Time 8    Period Weeks    Status Achieved                 Plan - 11/11/19 0939    Clinical Impression Statement Patient continues to be challenged with dynamic balance and is limited by fatigue. Patient had difficulty with vertical head turns with gait and required verbal cues for eye movement. Isolated this to NBOS on foam, patient was challenged with initial reps but was able to improve with practice. Patient had difficulty with tandem gait and sidestepping on foam beam and required min guard and intermittent HHA for stability. Will reassess patient progress at next session and adjust POC as indicate. Likely DC next visit.    Personal Factors and Comorbidities Comorbidity 2;Age    Comorbidities hypotension, DM, Hx of cancer    Examination-Activity Limitations Stand;Locomotion Level;Bend;Transfers;Stairs    Examination-Participation Restrictions Art gallery manager;Yard Work    [employer]chant [employer] officer Evolving/Moderate complexity    Rehab Potential Fair    PT Frequency 2x / week    PT Duration 8 weeks    PT Treatment/Interventions Aquatic Therapy;Biofeedback;Canalith Repostioning;Cryotherapy;Electrical Stimulation;Contrast Bath;Fluidtherapy;Therapeutic activities;Therapeutic exercise;Orthotic Fit/Training;Patient/family education;Manual lymph drainage;Compression bandaging;Taping;Vasopneumatic Device;Joint Manipulations;Splinting;Spinal Manipulations;Energy conservation;Dry needling;Manual techniques;Wheelchair mobility training;Passive range of motion;Scar mobilization;Prosthetic Training;Vestibular;Visual/perceptual remediation/compensation;Balance training;DME Instruction;Iontophoresis 4mg /ml Dexamethasone;Gait training;ADLs/Self Care Home Management;Neuromuscular re-education;Stair training;Moist Heat;Traction;Functional mobility training;Ultrasound;Parrafin;Cognitive  remediation    PT Next Visit Plan Reassess next visit, adjust POC as indicated. Possibly DC to HEP    PT Home Exercise Plan 6/16:  tandem stance, hip abd/ext, sit to stands, lateral visual tracking 6/22 seated marching, LAQ    Consulted and Agree with Plan of Care Patient    Family Member Consulted daughter           Patient will benefit from skilled therapeutic intervention in order to improve the following deficits and impairments:  Abnormal gait, Improper body mechanics, Dizziness, Postural dysfunction, Decreased activity tolerance, Decreased  endurance, Decreased strength, Decreased balance, Decreased safety awareness, Difficulty walking  Visit Diagnosis: Other abnormalities of gait and mobility  Dizziness and giddiness  Muscle weakness (generalized)     Problem List There are no problems to display for this patient.   9:49 AM, 11/11/19 Josue Hector PT DPT  Physical Therapist with [facility]  [phone] [place] [address] [place], [place], [address] Phone: [phone]   Fax:  [phone]  Name: Eric Brady MRN: [id number] Date of Birth: Sep 18, 1932

## 2019-11-13 ENCOUNTER — Other Ambulatory Visit: Payer: Self-pay

## 2019-11-13 ENCOUNTER — Encounter (HOSPITAL_COMMUNITY): Payer: Self-pay | Admitting: Physical Therapy

## 2019-11-13 ENCOUNTER — Ambulatory Visit (HOSPITAL_COMMUNITY): Payer: Medicare Other | Admitting: Physical Therapy

## 2019-11-13 DIAGNOSIS — R42 Dizziness and giddiness: Secondary | ICD-10-CM

## 2019-11-13 DIAGNOSIS — M6281 Muscle weakness (generalized): Secondary | ICD-10-CM

## 2019-11-13 DIAGNOSIS — R2689 Other abnormalities of gait and mobility: Secondary | ICD-10-CM

## 2019-11-13 NOTE — Therapy (Signed)
[facility] [address] [place], [place], [address] Phone: [phone]   Fax:  [phone]  Physical Therapy Treatment  Patient Details  Name: Eric Brady MRN: [id number] Date of Birth: [birth year] Referring Provider (PT): Asencion Noble MD   PHYSICAL THERAPY DISCHARGE SUMMARY  Visits from Start of Care: 16  Current functional level related to goals / functional outcomes: See below   Remaining deficits: See below    Education / Equipment: See assessment  Plan: Patient agrees to discharge.  Patient goals were met. Patient is being discharged due to meeting the stated rehab goals.  ?????      Encounter Date: 11/13/2019   PT End of Session - 11/13/19 0821    Visit Number 16    Number of Visits 16    Date for PT Re-Evaluation 11/15/19    Authorization Type BCBS Medicare (no auth, no VL)    Progress Note Due on Visit 20    PT Start Time 0817    PT Stop Time 0850    PT Time Calculation (min) 33 min    Equipment Utilized During Treatment Gait belt    Activity Tolerance Patient tolerated treatment well;Patient limited by fatigue    Behavior During Therapy WFL for tasks assessed/performed           Past Medical History:  Diagnosis Date  . Anemia   . Cancer [facility])    Prostate  . Diabetes mellitus without complication ([place])   . Hyperlipidemia   . Squamous cell carcinoma of skin 08/12/2019   in situ on right ear rim(CX3&EXC)    Past Surgical History:  Procedure Laterality Date  . COLONOSCOPY N/A 09/12/2012   Procedure: COLONOSCOPY;  Surgeon: Rogene Houston, MD;  [facility];  Service: Endoscopy;  Laterality: N/A;  915  . EYE SURGERY    . ORIF ANKLE FRACTURE Right   . YAG LASER APPLICATION Left 11/04/2120   Procedure: YAG LASER APPLICATION;  Surgeon: Williams Che, MD;  Location: [facility]S;  Service: Ophthalmology;  Laterality: Left;    There were no vitals filed for this visit.   Subjective Assessment -  11/13/19 0821    Subjective Patient says he is not doing well today. Says he feels weak. Says he feels his balance aint too bad, but feels weak today and like he "can't control". Patient says he feels about 50% better since starting therapy.    Patient is accompained by: Family member   Son   Currently in Pain? No/denies              [facility] PT Assessment - 11/13/19 0001      Assessment   Medical Diagnosis Gait Disturbance     Referring Provider (PT) Asencion Noble MD     Prior Therapy No      Precautions   Precautions Fall      Restrictions   Weight Bearing Restrictions No      Balance Screen   Has the patient fallen in the past 6 months No   Reports none since starting therapy      [place] residence    Living Arrangements Alone;Children    Available Help at Discharge Family      Prior Function   Level of Independence Independent      Cognition   Overall Cognitive Status Within Functional Limits for tasks assessed      Observation/Other Assessments   Observations Ambulates  without AD      Strength   Right Hip Flexion 4+/5    Left Hip Flexion 4+/5    Right Knee Extension 5/5    Left Knee Extension 5/5    Right Ankle Dorsiflexion 4+/5    Left Ankle Dorsiflexion 4+/5      Transfers   Five time sit to stand comments  14 sec with no UE       Dynamic Gait Index   Level Surface Mild Impairment    Change in Gait Speed Normal    Gait with Horizontal Head Turns Mild Impairment    Gait with Vertical Head Turns Mild Impairment    Gait and Pivot Turn Normal    Step Over Obstacle Mild Impairment    Step Around Obstacles Mild Impairment    Steps Mild Impairment    Total Score 18                                 PT Education - 11/13/19 0903    Education Details On DC status, transition to HEP and use of AD when walking outside and having "off days"    Person(s) Educated Patient;Child(ren)    Methods Explanation      Comprehension Verbalized understanding            PT Short Term Goals - 11/13/19 0901      PT SHORT TERM GOAL #1   Title Patient will be independent with initial HEP and self-management strategies to improve functional outcomes    Baseline Reviewed with patient    Time 4    Period Weeks    Status Achieved    Target Date 10/18/19      PT SHORT TERM GOAL #2   Title Patient will be able to perform stand x 5 (with improved form and less reliance on UEs)  in < 15 seconds to demonstrate improvement in functional mobility and reduced risk for falls.    Baseline Current 14 sec    Time 4    Period Weeks    Status Achieved    Target Date 10/18/19             PT Long Term Goals - 11/13/19 0901      PT LONG TERM GOAL #1   Title Patient will have equal to or > 4+/5 MMT throughout BLE to improve ability to perform functional mobility, stair ambulation and ADLs.    Baseline See MMT    Time 8    Period Weeks    Status Achieved      PT LONG TERM GOAL #2   Title Patient will report at least 50% overall improvement in subjective complaint to indicate improvement in ability to perform ADLs.    Baseline Reports 50%    Time 8    Period Weeks    Status Achieved      PT LONG TERM GOAL #3   Title Patient will improve DGI by at least 4 points to demonstrate improvement in functional mobility and reduced risk for falls.    Baseline Current 18    Time 8    Period Weeks    Status Achieved      PT LONG TERM GOAL #4   Title Patient will report improved confidence with ambulating on uneven surface (outside, around yard) with LRAD, with no recent falls, to demo improved functional mobility and ability to perform ADLs .  Baseline Reports compliance. Educated patient and son on use of cane when walking outside or when having "off days"    Time 8    Period Weeks    Status Achieved                 Plan - 11/13/19 0854    Clinical Impression Statement Patient has made good  progress toward therapy goals overall. Patient currently with all therapy goals met/ partially met. Patient shows significant improvement in tested measures, but demos occasional unsteadiness possibly related to his level of attention. Patient does show fluctuating need for verbal cues with familiar tasks, and often misinterprets commands for LT and RT. Patient states that he has "off days" where he feels "strange" in his head. Patient has made it known that he is currently undergoing assessment for possible Lyme's disease and Alpha Gal syndrome, as well as describing residual effects form a hormone treatment he has been taking. Patient does improve with task performance when full attention is provided and he is able to practice several reps. At this time patient will be DC from therapy to return to care of referring provider for further assessment. Patient with all therapy goals met/ partially met. Patient and his son educated on DC status and transition and instructed to follow up with therapy services with any further questions or concerns.    Personal Factors and Comorbidities Comorbidity 2;Age    Comorbidities hypotension, DM, Hx of cancer    Examination-Activity Limitations Stand;Locomotion Level;Bend;Transfers;Stairs    Examination-Participation Restrictions Art gallery manager;Yard Work    Stability/Clinical Decision Making Evolving/Moderate complexity    Rehab Potential Fair    PT Treatment/Interventions Aquatic Therapy;Biofeedback;Canalith Repostioning;Cryotherapy;Electrical Stimulation;Contrast Bath;Fluidtherapy;Therapeutic activities;Therapeutic exercise;Orthotic Fit/Training;Patient/family education;Manual lymph drainage;Compression bandaging;Taping;Vasopneumatic Device;Joint Manipulations;Splinting;Spinal Manipulations;Energy conservation;Dry needling;Manual techniques;Wheelchair mobility training;Passive range of motion;Scar mobilization;Prosthetic Training;Vestibular;Visual/perceptual  remediation/compensation;Balance training;DME Instruction;Iontophoresis 5m/ml Dexamethasone;Gait training;ADLs/Self Care Home Management;Neuromuscular re-education;Stair training;Moist Heat;Traction;Functional mobility training;Ultrasound;Parrafin;Cognitive remediation    PT Next Visit Plan DC    PT Home Exercise Plan 6/16:  tandem stance, hip abd/ext, sit to stands, lateral visual tracking 6/22 seated marching, LAQ    Consulted and Agree with Plan of Care Patient    Family Member Consulted Son           Patient will benefit from skilled therapeutic intervention in order to improve the following deficits and impairments:  Abnormal gait, Improper body mechanics, Dizziness, Postural dysfunction, Decreased activity tolerance, Decreased endurance, Decreased strength, Decreased balance, Decreased safety awareness, Difficulty walking  Visit Diagnosis: Other abnormalities of gait and mobility  Dizziness and giddiness  Muscle weakness (generalized)     Problem List There are no problems to display for this patient.  9:06 AM, 11/13/19 CJosue HectorPT DPT  [facility] [phone] [address]Phone: [phone]  Fax:  [phone] Name: Eric CHAMPINEMRN: [id number] of Birth: 1December 26, 1934

## 2019-11-15 DIAGNOSIS — C61 Malignant neoplasm of prostate: Secondary | ICD-10-CM | POA: Diagnosis not present

## 2019-11-15 DIAGNOSIS — R5383 Other fatigue: Secondary | ICD-10-CM | POA: Diagnosis not present

## 2019-11-15 DIAGNOSIS — Z923 Personal history of irradiation: Secondary | ICD-10-CM | POA: Diagnosis not present

## 2019-11-15 DIAGNOSIS — R531 Weakness: Secondary | ICD-10-CM | POA: Diagnosis not present

## 2019-11-29 DIAGNOSIS — Z79899 Other long term (current) drug therapy: Secondary | ICD-10-CM | POA: Diagnosis not present

## 2019-11-29 DIAGNOSIS — E1129 Type 2 diabetes mellitus with other diabetic kidney complication: Secondary | ICD-10-CM | POA: Diagnosis not present

## 2019-11-29 DIAGNOSIS — N183 Chronic kidney disease, stage 3 unspecified: Secondary | ICD-10-CM | POA: Diagnosis not present

## 2019-11-29 DIAGNOSIS — I951 Orthostatic hypotension: Secondary | ICD-10-CM | POA: Diagnosis not present

## 2019-12-06 DIAGNOSIS — E1122 Type 2 diabetes mellitus with diabetic chronic kidney disease: Secondary | ICD-10-CM | POA: Diagnosis not present

## 2019-12-06 DIAGNOSIS — N1832 Chronic kidney disease, stage 3b: Secondary | ICD-10-CM | POA: Diagnosis not present

## 2019-12-06 DIAGNOSIS — I951 Orthostatic hypotension: Secondary | ICD-10-CM | POA: Diagnosis not present

## 2019-12-10 DIAGNOSIS — D519 Vitamin B12 deficiency anemia, unspecified: Secondary | ICD-10-CM | POA: Diagnosis not present

## 2019-12-18 ENCOUNTER — Ambulatory Visit (INDEPENDENT_AMBULATORY_CARE_PROVIDER_SITE_OTHER): Payer: Medicare Other | Admitting: Allergy & Immunology

## 2019-12-18 ENCOUNTER — Other Ambulatory Visit: Payer: Self-pay

## 2019-12-18 ENCOUNTER — Encounter: Payer: Self-pay | Admitting: Allergy & Immunology

## 2019-12-18 VITALS — BP 130/78 | HR 56 | Temp 98.5°F | Resp 16 | Ht 69.5 in | Wt 182.0 lb

## 2019-12-18 DIAGNOSIS — C61 Malignant neoplasm of prostate: Secondary | ICD-10-CM | POA: Diagnosis not present

## 2019-12-18 DIAGNOSIS — T7800XD Anaphylactic reaction due to unspecified food, subsequent encounter: Secondary | ICD-10-CM | POA: Diagnosis not present

## 2019-12-18 MED ORDER — EPINEPHRINE 0.3 MG/0.3ML IJ SOAJ: 0.3000 mg | Freq: Once | INTRAMUSCULAR | 1 refills | Status: DC | PRN

## 2019-12-18 NOTE — Progress Notes (Signed)
NEW PATIENT  Date of Service/Encounter:  12/18/19  Referring provider: Asencion Noble, MD   Assessment:   Anaphylactic shock due to food  Prostate cancer (cT1c adenocarcinoma of the prostate; Gleason 8) - followed by Dr. Tresa Endo   Eric Brady presents for an evaluation of dizziness and a positive alpha gal panel.  The alpha gal IgE is certainly high enough to consider relevant.  Alpha gal can present in a variety of ways, although I have never seen a patient who just presents with dizziness alone.  Since it is fairly doable, I think it is worth a try to avoid red meats for 1 month to see how he does with this.  I think the more likely etiology of his dizziness is the prostate cancer therapy, but certainly this could be making things worse as well.  We did discuss ways to get rid of meat in his diet, including using [place] and chicken extensively as well as fish.  They are going to give this a try and get back with [place] in 1 month.  Because of the variable presentation of alpha gal allergies, we are going to provide an epinephrine autoinjector to be on the safe side.  We did demonstrate how to use the epinephrine autoinjector and provided an anaphylaxis management plan.  Plan/Recommendations:   1. Anaphylactic shock due to food - I would count this IgE level as positive. - Go ahead and remove red meat from your diet. - I do not think that you need to remove cow's milk at this time, although there is a small percentage of patients who need to do this. - EpiPen training provided. - Anaphylaxis management plan provided. - Information on alpha gal provided. - Call us in one month with an update. - I will say that your presentation is rather atypical (dizziness and scalp itching only), but I have seen alpha gal lead to some odd symptoms.   2. Return in about 6 months (around 06/16/2020).      Subjective:   Eric Brady is a 86 y.o. male presenting today for evaluation of  Chief  Complaint  Patient presents with  . Alpha-Gal    labs showed levels close enough to be evaluated per referring provider. he does have have some ongoing dizziness issues. his daughter recently had her levels checked due to the same symptoms and they decided to have his levels checked.     Eric Brady has a history of the following: Patient Active Problem List   Diagnosis Date Noted  . 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 Prostate cancer ([place]) 12/21/2019    History obtained from: chart review and patient and his son.  Eric Brady was referred by Asencion Noble, MD.     Eric Brady is a 86 y.o. male presenting for an evaluation of dizziness.  It seems that a lot of his dizziness has gotten worse since being treated for prostate cancer.  He is followed by Dr. Tresa Endo at [facility].  Per his last visit note, he has been receiving both radiation as well as Eligard 45 mg.  His son reports that this has caused quite a bit of weight loss, but he is nearing the end of it.  In any case, he has a longstanding history of dizziness.  He has not had any hives, stomach pain, or diarrhea. He has been much more tired than usual.  He has had some itching on his  scalp. Whereas before he was active and walking several miles, he now gets tired just from activities of daily living.  Because of all the symptoms, his daughter recommended that he get an alpha gal panel sent.  Apparently she has alpha gal that was diagnosed 5 to 6 years ago.  Therefore, Dr. Willey Blade, his PCP, sent an alpha gal panel in July 2021.  It came back positive to alpha gal with an IgE level of 2.21.  His IgE to beef was almost nonexistent and was negative to pork and lamb.  Avoidance was recommended, but before they started avoiding this, the family wanted to discuss the diagnosis with an allergist.    It should be noted that he does eat beef and pork on a daily basis.  He has sausage every morning.  They do  not notice any direct correlation with the episodes of dizziness and ingestion of red meat.  They are open to stopping the red meat, although the patient does not seem thrilled with this.  He does not eat a lot of dairy.  His atopic history is fairly unremarkable.  He has never had asthma, allergies, or food allergies.  Otherwise, there is no history of other atopic diseases, including asthma, food allergies, drug allergies, environmental allergies, stinging insect allergies, eczema, urticaria or contact dermatitis. There is no significant infectious history. Vaccinations are up to date.    Past Medical History: Patient Active Problem List   Diagnosis Date Noted  . 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 Prostate cancer ([place]) 12/21/2019    Medication List:  Allergies as of 12/18/2019   No Known Allergies     Medication List       Accurate as of December 18, 2019 11:59 PM. If you have any questions, ask your nurse or doctor.        aspirin EC 81 MG tablet Take 81 mg by mouth daily. Swallow whole.   atorvastatin 10 MG tablet Commonly known as: LIPITOR Take 10 mg by mouth at bedtime.   calcium-vitamin D 500-200 MG-UNIT tablet Commonly known as: OSCAL WITH D Take 2 tablets by mouth daily.   cholecalciferol 25 MCG (1000 UNIT) tablet Commonly known as: VITAMIN D3 Take 1,000 Units by mouth daily.   cyanocobalamin 1000 MCG/ML injection Commonly known as: (VITAMIN B-12) Inject 1,000 mcg into the muscle every 30 (thirty) days.   EPINEPHrine 0.3 mg/0.3 mL Soaj injection Commonly known as: EpiPen 2-Pak Inject 0.3 mg into the muscle once as needed for anaphylaxis. Started by: [name], MD   fludrocortisone 0.1 MG tablet Commonly known as: FLORINEF Take 1 tablet (0.1 mg total) by mouth daily.   metFORMIN 500 MG 24 hr tablet Commonly known as: GLUCOPHAGE-XR Take 1,000 mg by mouth at bedtime.   midodrine 10 MG tablet Commonly known as:  PROAMATINE Take 10 mg by mouth 3 (three) times daily.   OneTouch Verio test strip Generic drug: glucose blood 1 each daily.   pioglitazone 15 MG tablet Commonly known as: ACTOS Take 15 mg by mouth daily.       Birth History: non-contributory  Developmental History: non-contributory  Past Surgical History: Past Surgical History:  Procedure Laterality Date  . COLONOSCOPY N/A 09/12/2012   Procedure: COLONOSCOPY;  Surgeon: Rogene Houston, MD;  [facility];  Service: Endoscopy;  Laterality: N/A;  915  . EYE SURGERY    . ORIF ANKLE FRACTURE Right   . YAG LASER APPLICATION Left 7/93/9030  Procedure: YAG LASER APPLICATION;  Surgeon: Williams Che, MD;  Location: [facility] ORS;  Service: Ophthalmology;  Laterality: Left;     Family History: Family History  Problem Relation Age of Onset  . Cancer Mother   . Prostate cancer Brother      Social History: Juwon lives at home with his family.  He was in a house that is 84 years old.  There is carpeting in the main living areas and wood in the bedroom.  They have a heat pump for heating and cooling as well as central cooling.  He also has supplemental oil heating.  There is a Clinical research associate inside of the home and dog and cattle outside of the home.  He does not have dust mite covers on the bedding.  There is no tobacco exposure.  He is currently retired tobacco former.  He has been retired for several years now.   Review of Systems  Constitutional: Positive for malaise/fatigue and weight loss. Negative for chills and fever.  HENT: Negative.  Negative for congestion, ear discharge, ear pain, nosebleeds and sore throat.   Eyes: Negative for pain, discharge and redness.  Respiratory: Negative for cough, sputum production, shortness of breath and wheezing.   Cardiovascular: Negative.  Negative for chest pain and palpitations.  Gastrointestinal: Negative for constipation, diarrhea, heartburn, nausea and vomiting.  Skin:  Negative.  Negative for itching and rash.  Neurological: Positive for dizziness. Negative for headaches.  Endo/Heme/Allergies: Negative for environmental allergies. Does not bruise/bleed easily.       Objective:   Blood pressure 130/78, pulse (!) 56, temperature 98.5 F (36.9 C), temperature source Temporal, resp. rate 16, height 5' 9.5" (1.765 m), weight 182 lb (82.6 kg), SpO2 95 %. Body mass index is 26.49 kg/m.   Physical Exam:   Physical Exam Constitutional:      Appearance: He is well-developed.     Comments: Somewhat emaciated. Very pleasant, however. Cooperative with the exam.   HENT:     Head: Normocephalic and atraumatic.     Right Ear: Tympanic membrane, ear canal and external ear normal. No drainage, swelling or tenderness. Tympanic membrane is not injected, scarred, erythematous, retracted or bulging.     Left Ear: Tympanic membrane, ear canal and external ear normal. No drainage, swelling or tenderness. Tympanic membrane is not injected, scarred, erythematous, retracted or bulging.     Nose: No nasal deformity, septal deviation, mucosal edema or rhinorrhea.     Right Sinus: No maxillary sinus tenderness or frontal sinus tenderness.     Left Sinus: No maxillary sinus tenderness or frontal sinus tenderness.     Mouth/Throat:     Mouth: Mucous membranes are not pale and not dry.     Pharynx: Uvula midline.  Eyes:     General:        Right eye: No discharge.        Left eye: No discharge.     Conjunctiva/sclera: Conjunctivae normal.     Right eye: Right conjunctiva is not injected. No chemosis.    Left eye: Left conjunctiva is not injected. No chemosis.    Pupils: Pupils are equal, round, and reactive to light.  Cardiovascular:     Rate and Rhythm: Normal rate and regular rhythm.     Heart sounds: Normal heart sounds.  Pulmonary:     Effort: Pulmonary effort is normal. No tachypnea, accessory muscle usage or respiratory distress.     Breath sounds: Normal breath  sounds. No wheezing, rhonchi  or rales.     Comments: Moving air well in all lung fields. No increased work of breathing noted.  Chest:     Chest wall: No tenderness.  Abdominal:     Tenderness: There is no abdominal tenderness. There is no guarding or rebound.  Lymphadenopathy:     Head:     Right side of head: No submandibular, tonsillar or occipital adenopathy.     Left side of head: No submandibular, tonsillar or occipital adenopathy.     Cervical: No cervical adenopathy.  Skin:    Coloration: Skin is not pale.     Findings: No abrasion, erythema, petechiae or rash. Rash is not papular, urticarial or vesicular.  Neurological:     Mental Status: He is alert.       Diagnostic studies: none       Salvatore Marvel, MD [facility]

## 2019-12-18 NOTE — Patient Instructions (Addendum)
1. Anaphylactic shock due to food - I would count this IgE level as positive. - Go ahead and remove red meat from your diet. - I do not think that you need to remove cow's milk at this time, although there is a small percentage of patients who need to do this. - EpiPen training provided. - Anaphylaxis management plan provided. - Information on alpha gal provided. - Call us in one month with an update. - I will say that your presentation is rather atypical (dizziness and scalp itching only), but I have seen alpha gal lead to some odd symptoms.   2. Return in about 6 months (around 06/16/2020).     Please inform us of any Emergency Department visits, hospitalizations, or changes in symptoms. Call us before going to the ED for breathing or allergy symptoms since we might be able to fit you in for a sick visit. Feel free to contact us anytime with any questions, problems, or concerns.  It was a pleasure to meet you and your family today!  Websites that have reliable patient information: 1. American Academy of Asthma, Allergy, and Immunology: www.aaaai.org 2. Food Allergy Research and Education (FARE): foodallergy.org 3. Mothers of Asthmatics: http://www.asthmacommunitynetwork.org 4. American College of Allergy, Asthma, and Immunology: www.acaai.org   COVID-19 Vaccine Information can be found at: ShippingScam.co.uk For questions related to vaccine distribution or appointments, please email [email or web address] or call [phone].     "Like" [place] on Facebook and Instagram for our latest updates!        Make sure you are registered to vote! If you have moved or changed any of your contact information, you will need to get this updated before voting!  In some cases, you MAY be able to register to vote online: [email or web address]

## 2019-12-20 ENCOUNTER — Encounter: Payer: Self-pay | Admitting: Dermatology

## 2019-12-20 NOTE — Progress Notes (Signed)
   Follow-Up Visit   Subjective  Eric Brady is a 86 y.o. male who presents for the following: Follow-up (pt stated---both ears looking better and cyst on the back is flat. Denied pain.).  Crust left ear Location:  Duration:  Quality:  Associated Signs/Symptoms: Modifying Factors:  Severity:  Timing: Context: History of skin cancer right ear  Objective  Well appearing patient in no apparent distress; mood and affect are within normal limits.  All skin waist up examined.   Assessment & Plan    AK (actinic keratosis) (2) Right Forearm - Posterior; Left Scaphoid Fossa  Destruction of lesion - Left Scaphoid Fossa, Right Forearm - Posterior Complexity: simple   Destruction method: cryotherapy   Informed consent: discussed and consent obtained   Timeout:  patient name, date of birth, surgical site, and procedure verified Lesion destroyed using liquid nitrogen: Yes   Region frozen until ice ball extended beyond lesion: Yes   Cryotherapy cycles:  5 Outcome: patient tolerated procedure well with no complications    Personal history of skin cancer Right Scaphoid Fossa  Annual skin examination  Cyst of skin Mid Back  Unless request surgical excision we will leave this lesion.  Understands there is a chance of reinflammation.,     I, [name], MD, have reviewed all documentation for this visit.  The documentation on 12/20/19 for the exam, diagnosis, procedures, and orders are all accurate and complete.

## 2019-12-21 ENCOUNTER — Encounter: Payer: Self-pay | Admitting: Allergy & Immunology

## 2019-12-21 DIAGNOSIS — T7800XA Anaphylactic reaction due to unspecified food, initial encounter: Secondary | ICD-10-CM | POA: Insufficient documentation

## 2019-12-21 DIAGNOSIS — C61 Malignant neoplasm of prostate: Secondary | ICD-10-CM | POA: Insufficient documentation

## 2019-12-23 DIAGNOSIS — Z8042 Family history of malignant neoplasm of prostate: Secondary | ICD-10-CM | POA: Diagnosis not present

## 2019-12-23 DIAGNOSIS — C61 Malignant neoplasm of prostate: Secondary | ICD-10-CM | POA: Diagnosis not present

## 2020-01-03 ENCOUNTER — Encounter: Payer: Self-pay | Admitting: Allergy & Immunology

## 2020-01-13 DIAGNOSIS — D519 Vitamin B12 deficiency anemia, unspecified: Secondary | ICD-10-CM | POA: Diagnosis not present

## 2020-01-13 DIAGNOSIS — Z23 Encounter for immunization: Secondary | ICD-10-CM | POA: Diagnosis not present

## 2020-01-21 DIAGNOSIS — H903 Sensorineural hearing loss, bilateral: Secondary | ICD-10-CM | POA: Diagnosis not present

## 2020-01-21 DIAGNOSIS — R42 Dizziness and giddiness: Secondary | ICD-10-CM | POA: Diagnosis not present

## 2020-02-14 DIAGNOSIS — D519 Vitamin B12 deficiency anemia, unspecified: Secondary | ICD-10-CM | POA: Diagnosis not present

## 2020-02-19 ENCOUNTER — Encounter: Payer: Self-pay | Admitting: *Deleted

## 2020-02-19 ENCOUNTER — Other Ambulatory Visit: Payer: Self-pay

## 2020-02-19 ENCOUNTER — Ambulatory Visit: Payer: Medicare Other | Admitting: Cardiology

## 2020-02-19 ENCOUNTER — Encounter: Payer: Self-pay | Admitting: Cardiology

## 2020-02-19 VITALS — BP 110/60 | HR 76 | Ht 71.0 in | Wt 184.6 lb

## 2020-02-19 DIAGNOSIS — I951 Orthostatic hypotension: Secondary | ICD-10-CM

## 2020-02-19 DIAGNOSIS — R001 Bradycardia, unspecified: Secondary | ICD-10-CM | POA: Diagnosis not present

## 2020-02-19 NOTE — Progress Notes (Signed)
Clinical Summary Eric Brady is a 86 y.o.male seen today for follow up of the following medicalproblems   1. Dizzy spells/Orthostatic hypotension -previouslyfound to be severely orthostatic in clinic, SBP dropped 43 points with standing.  - has done well on midodrine and florinef - last visit we started florinef 0.1mg  daily due to high bp's  - some dizziness at times, mild and infrequent. No syncope - continues aggressive fluid hydration.   2. Prostate cancer - complete radiation, completed hormone therapy.   3. Bradycardia Mild sinus brady, chronic for several years. - 03/2019 monitor AV HR 52, reported symptoms correlated with SR and mild sinus brady  - no recent symptoms   SH: has had vaccine x 3, moderna.    Past Medical History:  Diagnosis Date  . Anemia   . Asthma    only as a child  . Cancer [facility])    Prostate  . Diabetes mellitus without complication ([place])   . Hyperlipidemia   . Squamous cell carcinoma of skin 08/12/2019   in situ on right ear rim(CX3&EXC)     No Known Allergies   Current Outpatient Medications  Medication Sig Dispense Refill  . aspirin EC 81 MG tablet Take 81 mg by mouth daily. Swallow whole.    Marland Kitchen atorvastatin (LIPITOR) 10 MG tablet Take 10 mg by mouth at bedtime.     . calcium-vitamin D (OSCAL WITH D) 500-200 MG-UNIT tablet Take 2 tablets by mouth daily.     . cholecalciferol (VITAMIN D3) 25 MCG (1000 UNIT) tablet Take 1,000 Units by mouth daily.    . cyanocobalamin (,VITAMIN B-12,) 1000 MCG/ML injection Inject 1,000 mcg into the muscle every 30 (thirty) days.    Marland Kitchen EPINEPHrine (EPIPEN 2-PAK) 0.3 mg/0.3 mL IJ SOAJ injection Inject 0.3 mg into the muscle once as needed for anaphylaxis. 2 each 1  . fludrocortisone (FLORINEF) 0.1 MG tablet Take 1 tablet (0.1 mg total) by mouth daily. 90 tablet 3  . metFORMIN (GLUCOPHAGE-XR) 500 MG 24 hr tablet Take 1,000 mg by mouth at bedtime.   11  . midodrine (PROAMATINE) 10 MG tablet  Take 10 mg by mouth 3 (three) times daily.    Eric Brady VERIO test strip 1 each daily.    . pioglitazone (ACTOS) 15 MG tablet Take 15 mg by mouth daily.     No current facility-administered medications for this visit.     Past Surgical History:  Procedure Laterality Date  . COLONOSCOPY N/A 09/12/2012   Procedure: COLONOSCOPY;  Surgeon: Rogene Houston, MD;  [facility];  Service: Endoscopy;  Laterality: N/A;  915  . EYE SURGERY    . ORIF ANKLE FRACTURE Right   . YAG LASER APPLICATION Left 4/43/1540   Procedure: YAG LASER APPLICATION;  Surgeon: Williams Che, MD;  Location: [facility];  Service: Ophthalmology;  Laterality: Left;     No Known Allergies    Family History  Problem Relation Age of Onset  . Cancer Mother   . Prostate cancer Brother      Social History Eric Brady reports that he quit smoking about 28 years ago. He has never used smokeless tobacco. Eric Brady reports no history of alcohol use.   Review of Systems CONSTITUTIONAL: No weight loss, fever, chills, weakness or fatigue.  HEENT: Eyes: No visual loss, blurred vision, double vision or yellow sclerae.No hearing loss, sneezing, congestion, runny nose or sore throat.  SKIN: No rash or itching.  CARDIOVASCULAR: per hpi RESPIRATORY: No  shortness of breath, cough or sputum.  GASTROINTESTINAL: No anorexia, nausea, vomiting or diarrhea. No abdominal pain or blood.  GENITOURINARY: No burning on urination, no polyuria NEUROLOGICAL: No headache, dizziness, syncope, paralysis, ataxia, numbness or tingling in the extremities. No change in bowel or bladder control.  MUSCULOSKELETAL: No muscle, back pain, joint pain or stiffness.  LYMPHATICS: No enlarged nodes. No history of splenectomy.  PSYCHIATRIC: No history of depression or anxiety.  ENDOCRINOLOGIC: No reports of sweating, cold or heat intolerance. No polyuria or polydipsia.  Marland Kitchen   Physical Examination Today's Vitals   02/19/20 1105 02/19/20  1112 02/19/20 1113  BP:  130/70 110/60  Pulse:  (!) 56 76  SpO2: 96% 96%   Weight: 184 lb 9.6 oz (83.7 kg)    Height: 5\' 11"  (1.803 m)     Body mass index is 25.75 kg/m.  Gen: resting comfortably, no acute distress HEENT: no scleral icterus, pupils equal round and reactive, no palptable cervical adenopathy,  CV: RRR, no m/r/g, no jvd Resp: Clear to auscultation bilaterally GI: abdomen is soft, non-tender, non-distended, normal bowel sounds, no hepatosplenomegaly MSK: extremities are warm, no edema.  Skin: warm, no rash Neuro:  no focal deficits Psych: appropriate affect   Diagnostic Studies 02/2018 echo Study Conclusions  - Left ventricle: The cavity size was normal. Systolic function was normal. The estimated ejection fraction was in the range of 60% to 65%. Wall motion was normal; there were no regional wall motion abnormalities. Left ventricular diastolic function parameters were normal. - Aortic valve: There was very mild stenosis. Valve area (Vmax): 2.29 cm^2. - Mitral valve: Moderately calcified annulus. Moderately thickened leaflets . - Left atrium: The atrium was mildly dilated. - Atrial septum: No defect or patent foramen ovale was identified.    Assessment and Plan  1. Dizziness/Orhotstatichypotension -doing well on current therapy, continue   2. Sinus bradycardia - history of chronic sinus brady, prior EKGs to 40s and 50s - recent monitor average HR low 50s which is his baselien -we will continue to monitor at this time.   F/u 6 months      [name], M.D.

## 2020-02-19 NOTE — Patient Instructions (Signed)
Your physician wants you to follow-up in: 6 MONTHS WITH DR BRANCH [place] You will receive a reminder letter in the mail two months in advance. If you don't receive a letter, please call our office to schedule the follow-up appointment.  Your physician recommends that you continue on your current medications as directed. Please refer to the Current Medication list given to you today.  Thank you for choosing [facility]!!    

## 2020-03-07 ENCOUNTER — Emergency Department (HOSPITAL_COMMUNITY): Payer: Medicare Other

## 2020-03-07 ENCOUNTER — Encounter (HOSPITAL_COMMUNITY): Payer: Self-pay | Admitting: Emergency Medicine

## 2020-03-07 ENCOUNTER — Emergency Department (HOSPITAL_COMMUNITY)
Admission: EM | Admit: 2020-03-07 | Discharge: 2020-03-07 | Disposition: A | Discharge status: Home / Self Care | Payer: Medicare Other | Attending: Emergency Medicine | Admitting: Emergency Medicine

## 2020-03-07 ENCOUNTER — Other Ambulatory Visit: Payer: Self-pay

## 2020-03-07 DIAGNOSIS — W19XXXA Unspecified fall, initial encounter: Secondary | ICD-10-CM

## 2020-03-07 DIAGNOSIS — Z7984 Long term (current) use of oral hypoglycemic drugs: Secondary | ICD-10-CM | POA: Insufficient documentation

## 2020-03-07 DIAGNOSIS — Z8546 Personal history of malignant neoplasm of prostate: Secondary | ICD-10-CM | POA: Diagnosis not present

## 2020-03-07 DIAGNOSIS — M19012 Primary osteoarthritis, left shoulder: Secondary | ICD-10-CM | POA: Diagnosis not present

## 2020-03-07 DIAGNOSIS — W1839XA Other fall on same level, initial encounter: Secondary | ICD-10-CM | POA: Insufficient documentation

## 2020-03-07 DIAGNOSIS — J40 Bronchitis, not specified as acute or chronic: Secondary | ICD-10-CM | POA: Diagnosis not present

## 2020-03-07 DIAGNOSIS — Z7982 Long term (current) use of aspirin: Secondary | ICD-10-CM | POA: Insufficient documentation

## 2020-03-07 DIAGNOSIS — E119 Type 2 diabetes mellitus without complications: Secondary | ICD-10-CM | POA: Diagnosis not present

## 2020-03-07 DIAGNOSIS — J45909 Unspecified asthma, uncomplicated: Secondary | ICD-10-CM | POA: Diagnosis not present

## 2020-03-07 DIAGNOSIS — S40012A Contusion of left shoulder, initial encounter: Secondary | ICD-10-CM | POA: Diagnosis not present

## 2020-03-07 DIAGNOSIS — Z20822 Contact with and (suspected) exposure to covid-19: Secondary | ICD-10-CM | POA: Diagnosis not present

## 2020-03-07 DIAGNOSIS — R0781 Pleurodynia: Secondary | ICD-10-CM | POA: Diagnosis not present

## 2020-03-07 DIAGNOSIS — R0789 Other chest pain: Secondary | ICD-10-CM | POA: Insufficient documentation

## 2020-03-07 DIAGNOSIS — Z87891 Personal history of nicotine dependence: Secondary | ICD-10-CM | POA: Insufficient documentation

## 2020-03-07 DIAGNOSIS — S4990XA Unspecified injury of shoulder and upper arm, unspecified arm, initial encounter: Secondary | ICD-10-CM | POA: Diagnosis present

## 2020-03-07 DIAGNOSIS — R059 Cough, unspecified: Secondary | ICD-10-CM | POA: Diagnosis not present

## 2020-03-07 LAB — RESP PANEL BY RT-PCR (FLU A&B, COVID) ARPGX2
Influenza A by PCR: NEGATIVE
Influenza B by PCR: NEGATIVE
SARS Coronavirus 2 by RT PCR: NEGATIVE

## 2020-03-07 LAB — CBG MONITORING, ED: Glucose-Capillary: 188 mg/dL — ABNORMAL HIGH (ref 70–99)

## 2020-03-07 MED ORDER — DOXYCYCLINE HYCLATE 100 MG PO TABS: 100.0000 mg | ORAL_TABLET | Freq: Two times a day (BID) | ORAL | 0 refills | Status: DC

## 2020-03-07 MED ORDER — ALBUTEROL SULFATE HFA 108 (90 BASE) MCG/ACT IN AERS
2.0000 | INHALATION_SPRAY | Freq: Once | RESPIRATORY_TRACT | Status: AC
  Administered 2020-03-07: 2 via RESPIRATORY_TRACT
  Filled 2020-03-07: qty 6.7

## 2020-03-07 NOTE — ED Triage Notes (Signed)
Pt fell yesterday walking in the door. Injury to left shoulder. Pt states he lost consciousness. Denies hitting head

## 2020-03-07 NOTE — ED Provider Notes (Signed)
[facility] EMERGENCY DEPARTMENT Provider Note   CSN: [id number] Arrival date & time: 03/07/20  9379     History Chief Complaint  Patient presents with  . Fall    Eric Brady is a 86 y.o. male.  The history is provided by the patient and a relative. No language interpreter was used.  Fall This is a new problem. Episode onset: wednesday and friday. The problem has not changed since onset.Associated symptoms include chest pain. Nothing aggravates the symptoms. Nothing relieves the symptoms. He has tried nothing for the symptoms. The treatment provided no relief.   Pt has a history of orthostatic hypotension.  Pt passed out on Wednesday and yesterday.  Pt reports pain in his left shoulder and chest.      Past Medical History:  Diagnosis Date  . Anemia   . Asthma    only as a child  . Cancer [facility])    Prostate  . Diabetes mellitus without complication ([place])   . Hyperlipidemia   . Squamous cell carcinoma of skin 08/12/2019   in situ on right ear rim(CX3&EXC)    Patient Active Problem List   Diagnosis Date Noted  . 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 Prostate cancer ([place]) 12/21/2019    Past Surgical History:  Procedure Laterality Date  . COLONOSCOPY N/A 09/12/2012   Procedure: COLONOSCOPY;  Surgeon: Rogene Houston, MD;  Location: [facility] ENDO SUITE;  Service: Endoscopy;  Laterality: N/A;  915  . EYE SURGERY    . ORIF ANKLE FRACTURE Right   . YAG LASER APPLICATION Left 0/24/0973   Procedure: YAG LASER APPLICATION;  Surgeon: Williams Che, MD;  Location: [facility] ORS;  Service: Ophthalmology;  Laterality: Left;       Family History  Problem Relation Age of Onset  . Cancer Mother   . Prostate cancer Brother     Social History   Tobacco Use  . Smoking status: Former Smoker    Quit date: 12/18/1991    Years since quitting: 28.2  . Smokeless tobacco: Never Used  Vaping Use  . Vaping Use: Never used  Substance Use Topics  . Alcohol use:  No  . Drug use: Never    Home Medications Prior to Admission medications   Medication Sig Start Date End Date Taking? Authorizing Provider  aspirin EC 81 MG tablet Take 81 mg by mouth daily. Swallow whole.    [provider]  atorvastatin (LIPITOR) 10 MG tablet Take 10 mg by mouth at bedtime.     [provider]  calcium-vitamin D (OSCAL WITH D) 500-200 MG-UNIT tablet Take 2 tablets by mouth daily.     [provider]  cholecalciferol (VITAMIN D3) 25 MCG (1000 UNIT) tablet Take 1,000 Units by mouth daily.    [provider]  cyanocobalamin (,VITAMIN B-12,) 1000 MCG/ML injection Inject 1,000 mcg into the muscle every 30 (thirty) days.    [provider]  EPINEPHrine (EPIPEN 2-PAK) 0.3 mg/0.3 mL IJ SOAJ injection Inject 0.3 mg into the muscle once as needed for anaphylaxis. 12/18/19   [name], MD  fludrocortisone (FLORINEF) 0.1 MG tablet Take 1 tablet (0.1 mg total) by mouth daily. 07/17/19   Strader, Fransisco Hertz, PA-C  metFORMIN (GLUCOPHAGE-XR) 500 MG 24 hr tablet Take 1,000 mg by mouth at bedtime.  01/28/15   [provider]  midodrine (PROAMATINE) 10 MG tablet Take 10 mg by mouth 3 (three) times daily.    [provider]  ONETOUCH VERIO test strip 1 each daily. 08/10/19   [provider]  pioglitazone (ACTOS) 15 MG tablet Take 15 mg by mouth daily. 12/06/19   [provider]    Allergies    Patient has no known allergies.  Review of Systems   Review of Systems  Cardiovascular: Positive for chest pain.  All other systems reviewed and are negative.   Physical Exam Updated Vital Signs BP 134/62   Pulse 63   Temp 97.8 F (36.6 C) (Oral)   Resp 19   Ht 5\' 11"  (1.803 m)   Wt 81.6 kg   SpO2 97%   BMI 25.10 kg/m   Physical Exam Vitals and nursing note reviewed.  Constitutional:      Appearance: He is well-developed.  HENT:     Head: Normocephalic.     Right Ear: Tympanic membrane normal.      Left Ear: Tympanic membrane normal.     Nose: Nose normal.     Mouth/Throat:     Mouth: Mucous membranes are moist.  Cardiovascular:     Rate and Rhythm: Normal rate.     Pulses: Normal pulses.  Pulmonary:     Effort: Pulmonary effort is normal.  Abdominal:     General: Abdomen is flat. There is no distension.  Musculoskeletal:        General: Normal range of motion.     Cervical back: Normal range of motion.  Skin:    General: Skin is warm.  Neurological:     General: No focal deficit present.     Mental Status: He is alert and oriented to person, place, and time.  Psychiatric:        Mood and Affect: Mood normal.     ED Results / Procedures / Treatments   Labs (all labs ordered are listed, but only abnormal results are displayed) Labs Reviewed  CBG MONITORING, ED - Abnormal; Notable for the following components:      Result Value   Glucose-Capillary 188 (*)    All other components within normal limits    EKG None  Radiology No results found.  Procedures Procedures (including critical care time)  Medications Ordered in ED Medications - No data to display  ED Course  I have reviewed the triage vital signs and the nursing notes.  Pertinent labs & imaging results that were available during my care of the patient were reviewed by me and considered in my medical decision making (see chart for details).    MDM Rules/Calculators/A&P                          MDM: Pt has rhonchi and cough.  Pt given albuterol.  He reports he feels better.  Ribs and shoulder no fracture.    Dr. Sabra Heck in to see and examine.  Pt given rx for doxycycline and albuterol   Final Clinical Impression(s) / ED Diagnoses Final diagnoses:  Fall, initial encounter  Contusion of left shoulder, initial encounter  Bronchitis    Rx / DC Orders ED Discharge Orders    None    An After Visit Summary was printed and given to the patient.    Fransico Meadow, [place] 03/07/20 1337     Noemi Chapel, MD 03/09/20 [phone]

## 2020-03-07 NOTE — Discharge Instructions (Addendum)
Return if any problems. Albuterol 2 puffs every 4 hours

## 2020-03-10 DIAGNOSIS — N183 Chronic kidney disease, stage 3 unspecified: Secondary | ICD-10-CM | POA: Diagnosis not present

## 2020-03-10 DIAGNOSIS — Z79899 Other long term (current) drug therapy: Secondary | ICD-10-CM | POA: Diagnosis not present

## 2020-03-10 DIAGNOSIS — I951 Orthostatic hypotension: Secondary | ICD-10-CM | POA: Diagnosis not present

## 2020-03-10 DIAGNOSIS — D649 Anemia, unspecified: Secondary | ICD-10-CM | POA: Diagnosis not present

## 2020-03-10 DIAGNOSIS — E1129 Type 2 diabetes mellitus with other diabetic kidney complication: Secondary | ICD-10-CM | POA: Diagnosis not present

## 2020-03-17 DIAGNOSIS — N183 Chronic kidney disease, stage 3 unspecified: Secondary | ICD-10-CM | POA: Diagnosis not present

## 2020-03-17 DIAGNOSIS — D519 Vitamin B12 deficiency anemia, unspecified: Secondary | ICD-10-CM | POA: Diagnosis not present

## 2020-03-17 DIAGNOSIS — E1122 Type 2 diabetes mellitus with diabetic chronic kidney disease: Secondary | ICD-10-CM | POA: Diagnosis not present

## 2020-03-17 DIAGNOSIS — D638 Anemia in other chronic diseases classified elsewhere: Secondary | ICD-10-CM | POA: Diagnosis not present

## 2020-04-02 DIAGNOSIS — E119 Type 2 diabetes mellitus without complications: Secondary | ICD-10-CM | POA: Diagnosis not present

## 2020-04-02 DIAGNOSIS — H43813 Vitreous degeneration, bilateral: Secondary | ICD-10-CM | POA: Diagnosis not present

## 2020-04-02 DIAGNOSIS — Z961 Presence of intraocular lens: Secondary | ICD-10-CM | POA: Diagnosis not present

## 2020-04-02 DIAGNOSIS — H04123 Dry eye syndrome of bilateral lacrimal glands: Secondary | ICD-10-CM | POA: Diagnosis not present

## 2020-04-22 DIAGNOSIS — D519 Vitamin B12 deficiency anemia, unspecified: Secondary | ICD-10-CM | POA: Diagnosis not present

## 2020-05-28 DIAGNOSIS — D519 Vitamin B12 deficiency anemia, unspecified: Secondary | ICD-10-CM | POA: Diagnosis not present

## 2020-06-09 DIAGNOSIS — E1129 Type 2 diabetes mellitus with other diabetic kidney complication: Secondary | ICD-10-CM | POA: Diagnosis not present

## 2020-06-09 DIAGNOSIS — D649 Anemia, unspecified: Secondary | ICD-10-CM | POA: Diagnosis not present

## 2020-06-09 DIAGNOSIS — D510 Vitamin B12 deficiency anemia due to intrinsic factor deficiency: Secondary | ICD-10-CM | POA: Diagnosis not present

## 2020-06-09 DIAGNOSIS — N183 Chronic kidney disease, stage 3 unspecified: Secondary | ICD-10-CM | POA: Diagnosis not present

## 2020-06-16 DIAGNOSIS — E1122 Type 2 diabetes mellitus with diabetic chronic kidney disease: Secondary | ICD-10-CM | POA: Diagnosis not present

## 2020-06-16 DIAGNOSIS — D500 Iron deficiency anemia secondary to blood loss (chronic): Secondary | ICD-10-CM | POA: Diagnosis not present

## 2020-06-16 DIAGNOSIS — N1832 Chronic kidney disease, stage 3b: Secondary | ICD-10-CM | POA: Diagnosis not present

## 2020-06-16 DIAGNOSIS — I951 Orthostatic hypotension: Secondary | ICD-10-CM | POA: Diagnosis not present

## 2020-06-29 DIAGNOSIS — D519 Vitamin B12 deficiency anemia, unspecified: Secondary | ICD-10-CM | POA: Diagnosis not present

## 2020-07-09 DIAGNOSIS — C61 Malignant neoplasm of prostate: Secondary | ICD-10-CM | POA: Diagnosis not present

## 2020-07-09 DIAGNOSIS — Z8042 Family history of malignant neoplasm of prostate: Secondary | ICD-10-CM | POA: Diagnosis not present

## 2020-08-02 ENCOUNTER — Other Ambulatory Visit: Payer: Self-pay | Admitting: Student

## 2020-08-03 DIAGNOSIS — D519 Vitamin B12 deficiency anemia, unspecified: Secondary | ICD-10-CM | POA: Diagnosis not present

## 2020-08-03 NOTE — Telephone Encounter (Signed)
Refill request

## 2020-08-09 ENCOUNTER — Encounter (HOSPITAL_COMMUNITY): Payer: Self-pay

## 2020-08-09 ENCOUNTER — Emergency Department (HOSPITAL_COMMUNITY)
Admission: EM | Admit: 2020-08-09 | Discharge: 2020-08-09 | Disposition: A | Discharge status: Home / Self Care | Payer: Medicare Other | Attending: Emergency Medicine | Admitting: Emergency Medicine

## 2020-08-09 ENCOUNTER — Emergency Department (HOSPITAL_COMMUNITY): Payer: Medicare Other

## 2020-08-09 ENCOUNTER — Other Ambulatory Visit: Payer: Self-pay

## 2020-08-09 DIAGNOSIS — Z7984 Long term (current) use of oral hypoglycemic drugs: Secondary | ICD-10-CM | POA: Insufficient documentation

## 2020-08-09 DIAGNOSIS — E119 Type 2 diabetes mellitus without complications: Secondary | ICD-10-CM | POA: Insufficient documentation

## 2020-08-09 DIAGNOSIS — J45909 Unspecified asthma, uncomplicated: Secondary | ICD-10-CM | POA: Diagnosis not present

## 2020-08-09 DIAGNOSIS — M5431 Sciatica, right side: Secondary | ICD-10-CM

## 2020-08-09 DIAGNOSIS — D649 Anemia, unspecified: Secondary | ICD-10-CM | POA: Diagnosis not present

## 2020-08-09 DIAGNOSIS — Z8546 Personal history of malignant neoplasm of prostate: Secondary | ICD-10-CM | POA: Insufficient documentation

## 2020-08-09 DIAGNOSIS — I10 Essential (primary) hypertension: Secondary | ICD-10-CM | POA: Diagnosis not present

## 2020-08-09 DIAGNOSIS — M5441 Lumbago with sciatica, right side: Secondary | ICD-10-CM | POA: Diagnosis not present

## 2020-08-09 DIAGNOSIS — Z7951 Long term (current) use of inhaled steroids: Secondary | ICD-10-CM | POA: Insufficient documentation

## 2020-08-09 DIAGNOSIS — Z87891 Personal history of nicotine dependence: Secondary | ICD-10-CM | POA: Insufficient documentation

## 2020-08-09 DIAGNOSIS — Z85828 Personal history of other malignant neoplasm of skin: Secondary | ICD-10-CM | POA: Diagnosis not present

## 2020-08-09 DIAGNOSIS — M79604 Pain in right leg: Secondary | ICD-10-CM | POA: Diagnosis not present

## 2020-08-09 DIAGNOSIS — M545 Low back pain, unspecified: Secondary | ICD-10-CM | POA: Diagnosis not present

## 2020-08-09 DIAGNOSIS — Z7982 Long term (current) use of aspirin: Secondary | ICD-10-CM | POA: Insufficient documentation

## 2020-08-09 DIAGNOSIS — M25551 Pain in right hip: Secondary | ICD-10-CM | POA: Diagnosis not present

## 2020-08-09 LAB — LACTIC ACID, PLASMA
Lactic Acid, Venous: 1.1 mmol/L (ref 0.5–1.9)
Lactic Acid, Venous: 1 mmol/L (ref 0.5–1.9)

## 2020-08-09 LAB — BASIC METABOLIC PANEL
Anion gap: 9 (ref 5–15)
BUN: 28 mg/dL — ABNORMAL HIGH (ref 8–23)
CO2: 24 mmol/L (ref 22–32)
Calcium: 9.4 mg/dL (ref 8.9–10.3)
Chloride: 109 mmol/L (ref 98–111)
Creatinine, Ser: 1.43 mg/dL — ABNORMAL HIGH (ref 0.61–1.24)
GFR, Estimated: 48 mL/min — ABNORMAL LOW (ref >60)
Glucose, Bld: 140 mg/dL — ABNORMAL HIGH (ref 70–99)
Potassium: 4.1 mmol/L (ref 3.5–5.1)
Sodium: 142 mmol/L (ref 135–145)

## 2020-08-09 LAB — CBC WITH DIFFERENTIAL/PLATELET
Abs Immature Granulocytes: 0.01 10*3/uL (ref 0.00–0.07)
Basophils Absolute: 0.1 10*3/uL (ref 0.0–0.1)
Basophils Relative: 1 %
Eosinophils Absolute: 0.1 10*3/uL (ref 0.0–0.5)
Eosinophils Relative: 2 %
HCT: 35.8 % — ABNORMAL LOW (ref 39.0–52.0)
Hemoglobin: 11.1 g/dL — ABNORMAL LOW (ref 13.0–17.0)
Immature Granulocytes: 0 %
Lymphocytes Relative: 28 %
Lymphs Abs: 2 10*3/uL (ref 0.7–4.0)
MCH: 28.3 pg (ref 26.0–34.0)
MCHC: 31 g/dL (ref 30.0–36.0)
MCV: 91.3 fL (ref 80.0–100.0)
Monocytes Absolute: 0.5 10*3/uL (ref 0.1–1.0)
Monocytes Relative: 7 %
Neutro Abs: 4.5 10*3/uL (ref 1.7–7.7)
Neutrophils Relative %: 62 %
Platelets: 202 10*3/uL (ref 150–400)
RBC: 3.92 MIL/uL — ABNORMAL LOW (ref 4.22–5.81)
RDW: 14.4 % (ref 11.5–15.5)
WBC: 7.2 10*3/uL (ref 4.0–10.5)
nRBC: 0 % (ref 0.0–0.2)

## 2020-08-09 MED ORDER — MORPHINE SULFATE (PF) 4 MG/ML IV SOLN
4.0000 mg | Freq: Once | INTRAVENOUS | Status: AC
  Administered 2020-08-09: 4 mg via INTRAVENOUS
  Filled 2020-08-09: qty 1

## 2020-08-09 MED ORDER — TRAMADOL HCL 50 MG PO TABS
50.0000 mg | ORAL_TABLET | Freq: Four times a day (QID) | ORAL | 0 refills | Status: DC | PRN
Start: 2020-08-09 — End: 2022-08-12

## 2020-08-09 MED ORDER — ONDANSETRON HCL 4 MG/2ML IJ SOLN
4.0000 mg | Freq: Once | INTRAMUSCULAR | Status: AC
  Administered 2020-08-09: 4 mg via INTRAVENOUS
  Filled 2020-08-09: qty 2

## 2020-08-09 MED ORDER — HYDROCODONE-ACETAMINOPHEN 5-325MG PREPACK (~~LOC~~: ORAL_TABLET | ORAL | 0 refills | Status: DC

## 2020-08-09 MED ORDER — LIDOCAINE 5 % EX PTCH
1.0000 | MEDICATED_PATCH | CUTANEOUS | Status: DC
  Administered 2020-08-09: 1 via TRANSDERMAL
  Filled 2020-08-09: qty 1

## 2020-08-09 NOTE — ED Provider Notes (Signed)
I provided a substantive portion of the care of this patient.  I personally performed the entirety of the history, exam and medical decision making for this encounter.  EKG Interpretation  Date/Time:  Sunday Aug 09 2020 09:13:51 EDT Ventricular Rate:  53 PR Interval:  176 QRS Duration: 101 QT Interval:  455 QTC Calculation: 428 R Axis:   78 Text Interpretation: Sinus rhythm Nonspecific T abnormalities, lateral leads Baseline wander in lead(s) V4 No significant change since last tracing Confirmed by Fredia Sorrow [phone]) on 08/09/2020 11:00:21 AM  Patient seen by me along with physician assistant.  Patient brought in with about a 1 week history of some mild low back pain and pain radiating down to just below the knee of the right leg.  It was more severe last night had trouble sleeping.  No recent falls or injuries.  Denies any numbness or weakness to his foot or toes.  Patient has a history of orthostatic hypotension.  Followed by cardiology here in [place].  Patient is on midodrine for this.  He normally only takes it during the daytime when he is up and about.  He does not take it at night.  Because he pressures get very high if he lays down with it.  Pressures on presentation here were very high.  Probably secondary to this.  Work-up to include x-ray of the pelvis and right hip without any acute bony injuries.  An x-ray of the lumbar spine without evidence of any compression fractions.  Patient has stable fusion at L5-S1.  Does have progressive degenerative disease though at L2-L3.  On exam patient's right foot good cap refill.  Sensation seems to be intact.  Good movement of toes and ankle.  Patient on neuro exam does seem to have some memory issues.  Son was very helpful in filling and that the symptoms of actually been ongoing at least for a few days.  May be a week.  Was able to fill his and on the need for the medication for orthostatic hypotension which is quite severe until he was  treated with that he would fall frequently.  Patient's blood pressure did improve here.  Do not feel that we need any acute intervention on blood pressure.  Because son states it will be high when he lays down.  Will treat symptomatically with pain medicine recommended prepack of hydrocodone and then tramadol to take as well.    Fredia Sorrow, MD 08/09/20 1140

## 2020-08-09 NOTE — ED Provider Notes (Signed)
[facility] EMERGENCY DEPARTMENT Provider Note   CSN: [id number] Arrival date & time: 08/09/20  0845     History Chief Complaint  Patient presents with  . Leg Pain    Eric Brady is a 87 y.o. male who presents with concern for right lower back pain that radiates into his right leg.  Patient is able to ambulate, however states that he "hobbled".  Patient states that the pain became worse last night.  Patient's son is at the bedside who endorses the patient has been experiencing this pain for the last 5 days and is gone progressively worse, the patient is unable to sleep well through the night.  Patient denies any saddle anesthesia, urinary frequency or urgency, denies any difficulty urinating that is new for him, does have some baseline difficulty following treatment for prostate cancer.  Of note patient has history of hypertension, on midodrine and fludrocortisone to maintain safe systolic blood pressures.  Patient did take his medications immediately prior to coming to the emergency department, and then laid flat in the ER bed, which is against the instructions for taking midodrine.  Patient did become extremely hypertensive in the emergency department, and his son feels that the midodrine and lying is likely the significant contributor.  I personally reviewed this patient's medical records.  He underwent treatment for prostate cancer a few years ago.  Has history of type 2 diabetes and hyperlipidemia.   HPI     Past Medical History:  Diagnosis Date  . Anemia   . Asthma    only as a child  . Cancer [facility])    Prostate  . Diabetes mellitus without complication ([place])   . Hyperlipidemia   . Squamous cell carcinoma of skin 08/12/2019   in situ on right ear rim(CX3&EXC)    Patient Active Problem List   Diagnosis Date Noted  . 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 Prostate cancer ([place]) 12/21/2019    Past Surgical History:  Procedure Laterality Date   . COLONOSCOPY N/A 09/12/2012   Procedure: COLONOSCOPY;  Surgeon: Rogene Houston, MD;  Location: [facility] ENDO SUITE;  Service: Endoscopy;  Laterality: N/A;  915  . EYE SURGERY    . ORIF ANKLE FRACTURE Right   . YAG LASER APPLICATION Left 0/86/7619   Procedure: YAG LASER APPLICATION;  Surgeon: Williams Che, MD;  Location: [facility] ORS;  Service: Ophthalmology;  Laterality: Left;       Family History  Problem Relation Age of Onset  . Cancer Mother   . Prostate cancer Brother     Social History   Tobacco Use  . Smoking status: Former Smoker    Quit date: 12/18/1991    Years since quitting: 28.6  . Smokeless tobacco: Never Used  Vaping Use  . Vaping Use: Never used  Substance Use Topics  . Alcohol use: No  . Drug use: Never    Home Medications Prior to Admission medications   Medication Sig Start Date End Date Taking? Authorizing Provider  HYDROcodone-acetaminophen (VICODIN) 5-325 mg TABS tablet Take one tablet by mouth every six hours as needed for pain 08/09/20  Yes Erick Oxendine R, PA-C  traMADol (ULTRAM) 50 MG tablet Take 1 tablet (50 mg total) by mouth every 6 (six) hours as needed. 08/09/20  Yes Geary Rufo, [name], PA-C  aspirin EC 81 MG tablet Take 81 mg by mouth daily. Swallow whole.    [provider]  atorvastatin (LIPITOR) 10 MG tablet Take 10 mg  by mouth at bedtime.     [provider]  calcium-vitamin D (OSCAL WITH D) 500-200 MG-UNIT tablet Take 2 tablets by mouth daily.     [provider]  cholecalciferol (VITAMIN D3) 25 MCG (1000 UNIT) tablet Take 1,000 Units by mouth daily.    [provider]  cyanocobalamin (,VITAMIN B-12,) 1000 MCG/ML injection Inject 1,000 mcg into the muscle every 30 (thirty) days.    [provider]  doxycycline (VIBRA-TABS) 100 MG tablet Take 1 tablet (100 mg total) by mouth 2 (two) times daily. 03/07/20   Fransico Meadow, PA-C  doxycycline (VIBRA-TABS) 100 MG tablet Take 1 tablet (100 mg total) by  mouth 2 (two) times daily. 03/07/20   Fransico Meadow, PA-C  EPINEPHrine (EPIPEN 2-PAK) 0.3 mg/0.3 mL IJ SOAJ injection Inject 0.3 mg into the muscle once as needed for anaphylaxis. 12/18/19   [name], MD  fludrocortisone (FLORINEF) 0.1 MG tablet TAKE 1 TABLET BY MOUTH ONCE DAILY. 08/03/20   Strader, Fransisco Hertz, PA-C  metFORMIN (GLUCOPHAGE-XR) 500 MG 24 hr tablet Take 1,000 mg by mouth at bedtime.  01/28/15   [provider]  midodrine (PROAMATINE) 10 MG tablet Take 10 mg by mouth 3 (three) times daily.    [provider]  [facility] VERIO test strip 1 each daily. 08/10/19   [provider]  pioglitazone (ACTOS) 15 MG tablet Take 15 mg by mouth daily. 12/06/19   [provider]    Allergies    Patient has no known allergies.  Review of Systems   Review of Systems  Constitutional: Negative.   HENT: Negative.   Eyes: Negative.  Negative for visual disturbance.  Respiratory: Negative for cough and shortness of breath.   Cardiovascular: Negative.  Negative for chest pain, palpitations and leg swelling.  Gastrointestinal: Negative.   Musculoskeletal: Positive for back pain and myalgias. Negative for neck pain and neck stiffness.  Neurological: Negative for dizziness, tremors, syncope, facial asymmetry, weakness, light-headedness, numbness and headaches.    Physical Exam Updated Vital Signs BP (!) 186/76   Pulse (!) 46   Temp 97.6 F (36.4 C) (Oral)   Resp 15   Ht 5\' 11"  (1.803 m)   Wt 82.6 kg   SpO2 94%   BMI 25.38 kg/m   Physical Exam Vitals and nursing note reviewed.  HENT:     Head: Normocephalic and atraumatic.     Nose: Nose normal.     Mouth/Throat:     Mouth: Mucous membranes are moist.     Pharynx: Oropharynx is clear. Uvula midline. No oropharyngeal exudate, posterior oropharyngeal erythema or uvula swelling.     Tonsils: No tonsillar exudate.  Eyes:     General: Lids are normal. Vision grossly intact.        Right eye: No  discharge.        Left eye: No discharge.     Extraocular Movements: Extraocular movements intact.     Conjunctiva/sclera: Conjunctivae normal.     Pupils: Pupils are equal, round, and reactive to light.  Neck:     Trachea: Trachea and phonation normal.  Cardiovascular:     Rate and Rhythm: Normal rate and regular rhythm.     Pulses:          Dorsalis pedis pulses are 1+ on the right side and 1+ on the left side.     Heart sounds: Normal heart sounds. No murmur heard.   Pulmonary:     Effort: Pulmonary effort  is normal. No tachypnea, bradypnea, accessory muscle usage, prolonged expiration or respiratory distress.     Breath sounds: Normal breath sounds. No wheezing or rales.  Chest:     Chest wall: No mass, lacerations, deformity, swelling, tenderness, crepitus or edema.  Abdominal:     General: Bowel sounds are normal. There is no distension.     Palpations: Abdomen is soft.     Tenderness: There is no abdominal tenderness. There is no right CVA tenderness, left CVA tenderness, guarding or rebound.  Musculoskeletal:        General: No deformity.     Cervical back: Normal range of motion and neck supple. No rigidity, spasms, tenderness, bony tenderness or crepitus. No pain with movement, spinous process tenderness or muscular tenderness.     Thoracic back: Spasms present. No tenderness or bony tenderness.     Lumbar back: Spasms and tenderness present. No bony tenderness. Positive right straight leg raise test. Negative left straight leg raise test.     Right lower leg: No edema.     Left lower leg: No edema.  Lymphadenopathy:     Cervical: No cervical adenopathy.  Skin:    General: Skin is warm and dry.  Neurological:     Mental Status: He is alert. Mental status is at baseline.  Psychiatric:        Mood and Affect: Mood normal.     ED Results / Procedures / Treatments   Labs (all labs ordered are listed, but only abnormal results are displayed) Labs Reviewed  BASIC  METABOLIC PANEL - Abnormal; Notable for the following components:      Result Value   Glucose, Bld 140 (*)    BUN 28 (*)    Creatinine, Ser 1.43 (*)    GFR, Estimated 48 (*)    All other components within normal limits  CBC WITH DIFFERENTIAL/PLATELET - Abnormal; Notable for the following components:   RBC 3.92 (*)    Hemoglobin 11.1 (*)    HCT 35.8 (*)    All other components within normal limits  LACTIC ACID, PLASMA  LACTIC ACID, PLASMA    EKG EKG Interpretation  Date/Time:  Sunday Aug 09 2020 09:13:51 EDT Ventricular Rate:  53 PR Interval:  176 QRS Duration: 101 QT Interval:  455 QTC Calculation: 428 R Axis:   78 Text Interpretation: Sinus rhythm Nonspecific T abnormalities, lateral leads Baseline wander in lead(s) V4 No significant change since last tracing Confirmed by Fredia Sorrow [phone]) on 08/09/2020 11:00:21 AM   Radiology DG Lumbar Spine Complete  Result Date: 08/09/2020 CLINICAL DATA:  Right-sided leg pain. EXAM: LUMBAR SPINE - COMPLETE 4+ VIEW COMPARISON:  Lumbar spine radiographs 07/29/2010 FINDINGS: Progressive degenerative changes are noted in the lumbar spine. There is progressive loss of disc height and endplate change at I3-7 with slight retrolisthesis. No residual disc space is evident at L5-S1. Fusion is apparent. Straightening of the normal lumbar lordosis is similar the prior exam. Moderate facet hypertrophy is present at L5-S1 bilaterally, left greater than right. No acute abnormalities are present. Atherosclerotic calcifications are present. IMPRESSION: 1. No acute abnormality. 2. Progressive degenerative disc disease at L2-3 with slight retrolisthesis. 3. Stable fusion at L5-S1. 4. Stable moderate facet hypertrophy at L5-S1 bilaterally, left greater than right. 5. Atherosclerosis. Electronically Signed   By: San Morelle M.D.   On: 08/09/2020 10:29   DG Hip Unilat W or Wo Pelvis 2-3 Views Right  Result Date: 08/09/2020 CLINICAL DATA:  87 year old  male with pain  radiating from the back to the right leg since last night. No known injury. EXAM: DG HIP (WITH OR WITHOUT PELVIS) 2-3V RIGHT COMPARISON:  CT Abdomen and Pelvis 01/05/2019. FINDINGS: Femoral heads remain normally located. Hip joint spaces appear stable since 2020. Intact pelvis. Negative visible lower abdominal and pelvic visceral contours. Grossly intact proximal left femur. Proximal right femur appears intact. No acute osseous abnormality identified. IMPRESSION: No acute osseous abnormality identified about the right hip or pelvis. Electronically Signed   By: Genevie Ann M.D.   On: 08/09/2020 10:29    Procedures Procedures   Medications Ordered in ED Medications  lidocaine (LIDODERM) 5 % 1 patch (has no administration in time range)  morphine 4 MG/ML injection 4 mg (4 mg Intravenous Given 08/09/20 1043)  ondansetron (ZOFRAN) injection 4 mg (4 mg Intravenous Given 08/09/20 1043)    ED Course  I have reviewed the triage vital signs and the nursing notes.  Pertinent labs & imaging results that were available during my care of the patient were reviewed by me and considered in my medical decision making (see chart for details).    MDM Rules/Calculators/A&P                         87 year old male with history of hypotension on fludrocortisone and midodrine who presents with concern for 4 days of gradually worsening right low back pain that radiates into the left right leg.  The emergent differential diagnosis for low back pain includes acute ligamentous and muscular injury, cord compression syndrome, pathologic fracture, transverse myelitis, vertebral osteomyelitis, discitis, and epidural abscess.  Nonemergent differential diagnosis includes but is not limited to osteoarthritis, sciatic nerve pain, degenerative disc disease, listhesis, metastatic disease, pyelonephritis.  Hypertensive on intake, vital signs otherwise reassuring.  Cardiopulmonary exam is normal, abdominal exam is benign.   Patient is neurovascularly intact in lower extremities bilaterally, without focal tenderness to palpation, though there is pain elicited with straight leg raise.  CBC with baseline anemia, hemoglobin of 11.1.  Lactic acid is normal, BMP with patient's baseline creatinine of 1.43.    EKG with sinus rhythm, unchanged from prior.  Plain films of the right hip, pelvis, and lumbar spine without acute osseous abnormality.  Given reassuring physical exam, vital signs, and laboratory imaging studies, no further work-up is warranted in the this time.  Patient's presentation and physical exam most consistent with sciatic nerve pain.  Doubt epidural hematoma or infection as patient is low risk and does not have any other systemic symptoms.  With reassuring CBC.   Patient is on Florinef for management of his blood pressure, will not prescribe systemic glucocorticoids, but will provide prescription for tramadol and a Norco prepack here in the emergency department.  We will also offer her Lidoderm patch.  Recommend close follow-up with primary care doctor for evaluation for possible physical therapy.  Improvement in patient's blood pressure, now 186/76.  Joneen Caraway and his son both voiced understanding with medical evaluation and treatment plan.  Each of their questions was answered to their expressed satisfaction.  Return precautions are given.  Patient is stable and appropriate for discharge at this time.  This chart was dictated using voice recognition software, Dragon. Despite the best efforts of this provider to proofread and correct errors, errors may still occur which can change documentation meaning.   Final Clinical Impression(s) / ED Diagnoses Final diagnoses:  Sciatica of right side    Rx / DC Orders ED Discharge Orders  Ordered    HYDROcodone-acetaminophen (VICODIN) 5-325 mg TABS tablet        08/09/20 1152    traMADol (ULTRAM) 50 MG tablet  Every 6 hours PRN        08/09/20 1156            Ryleeann Urquiza, [name], PA-C 08/09/20 1201    Fredia Sorrow, MD 08/10/20 [phone]

## 2020-08-09 NOTE — Discharge Instructions (Addendum)
Eric Brady was seen in the ER today for his back pain.  His physical exam, blood work, and x-rays were very reassuring.  There are no broken bones at this time.  Suspect his symptoms are secondary to sciatica which is inflammation of the sciatic nerve that runs from your lower back through your buttocks, and can cause pain radiating down your leg.  He was administered a lidocaine patch in the emergency department today; he may continue to utilize these at home as needed.  They can be purchased over-the-counter.  Additionally has been prescribed a medication called tramadol to take throughout the day for his pain as needed, and has been provided a short course of stronger pain medication called hydrocodone with acetaminophen which you may take for more severe pain at nighttime to allow him to rest.     Please follow-up closely with his primary care doctor, and discuss possible physical therapy referral with them.  Return to the ER if he develops any numbness, tingling, weakness in his legs, difficulty walking, or any other new severe symptoms.

## 2020-08-09 NOTE — ED Triage Notes (Signed)
Pt c/o right leg pain that radiates into his back area since last night.No injury known.

## 2020-08-10 MED FILL — Hydrocodone-Acetaminophen Tab 5-325 MG: ORAL | Qty: 6 | Status: AC

## 2020-08-18 DIAGNOSIS — M5416 Radiculopathy, lumbar region: Secondary | ICD-10-CM | POA: Diagnosis not present

## 2020-09-02 DIAGNOSIS — M5416 Radiculopathy, lumbar region: Secondary | ICD-10-CM | POA: Diagnosis not present

## 2020-09-03 ENCOUNTER — Other Ambulatory Visit (HOSPITAL_COMMUNITY): Payer: Self-pay | Admitting: Internal Medicine

## 2020-09-03 ENCOUNTER — Other Ambulatory Visit: Payer: Self-pay | Admitting: Internal Medicine

## 2020-09-03 DIAGNOSIS — M5416 Radiculopathy, lumbar region: Secondary | ICD-10-CM

## 2020-09-07 DIAGNOSIS — D519 Vitamin B12 deficiency anemia, unspecified: Secondary | ICD-10-CM | POA: Diagnosis not present

## 2020-09-15 ENCOUNTER — Ambulatory Visit: Payer: Medicare Other | Admitting: Cardiology

## 2020-09-15 ENCOUNTER — Encounter: Payer: Self-pay | Admitting: Cardiology

## 2020-09-15 ENCOUNTER — Other Ambulatory Visit: Payer: Self-pay

## 2020-09-15 VITALS — BP 128/72 | HR 62 | Ht 71.0 in | Wt 184.0 lb

## 2020-09-15 DIAGNOSIS — D500 Iron deficiency anemia secondary to blood loss (chronic): Secondary | ICD-10-CM | POA: Diagnosis not present

## 2020-09-15 DIAGNOSIS — I951 Orthostatic hypotension: Secondary | ICD-10-CM | POA: Diagnosis not present

## 2020-09-15 DIAGNOSIS — Z79899 Other long term (current) drug therapy: Secondary | ICD-10-CM | POA: Diagnosis not present

## 2020-09-15 DIAGNOSIS — N1832 Chronic kidney disease, stage 3b: Secondary | ICD-10-CM | POA: Diagnosis not present

## 2020-09-15 DIAGNOSIS — E1129 Type 2 diabetes mellitus with other diabetic kidney complication: Secondary | ICD-10-CM | POA: Diagnosis not present

## 2020-09-15 MED ORDER — FLUDROCORTISONE ACETATE 0.1 MG PO TABS: 0.2000 mg | ORAL_TABLET | Freq: Every day | ORAL | 3 refills | Status: DC

## 2020-09-15 NOTE — Patient Instructions (Signed)
Medication Instructions:       INCREASE Florinef to 0.2 mg daily  *If you need a refill on your cardiac medications before your next appointment, please call your pharmacy*   Lab Work: None today   If you have labs (blood work) drawn today and your tests are completely normal, you will receive your results only by: [place] (if you have MyChart) OR A paper copy in the mail If you have any lab test that is abnormal or we need to change your treatment, we will call you to review the results.   Testing/Procedures: None today     Follow-Up: At [facility], you and your health needs are our priority.  As part of our continuing mission to provide you with exceptional heart care, we have created designated Provider Care Teams.  These Care Teams include your primary Cardiologist (physician) and Advanced Practice Providers (APPs -  Physician Assistants and Nurse Practitioners) who all work together to provide you with the care you need, when you need it.  We recommend signing up for the patient portal called "MyChart".  Sign up information is provided on this After Visit Summary.  MyChart is used to connect with patients for Virtual Visits (Telemedicine).  Patients are able to view lab/test results, encounter notes, upcoming appointments, etc.  Non-urgent messages can be sent to your provider as well.   To learn more about what you can do with MyChart, go to [email or web address].    Your next appointment:   6 month(s)  The format for your next appointment:   In Person  Provider:   Carlyle Dolly, MD   Other Instructions None

## 2020-09-15 NOTE — Progress Notes (Signed)
Clinical Summary Eric Brady is a 87 y.o.male seen today for follow up of the following medical problems      1. Dizzy spells/Orthostatic hypotension - previously found to be severely orthostatic in clinic, SBP dropped 43 points with standing.  - has done well on midodrine and florinef     - some recent near syncope with standing - compliant with meds. Working to stay hydrated, high sodium intake    2. Prostate cancer - complete radiation, completed hormone therapy.      3. Bradycardia Mild sinus brady, chronic for several years.  - 03/2019 monitor AV HR 52, reported symptoms correlated with SR and mild sinus brady      SH: has had vaccine x 3, moderna.    Past Medical History:  Diagnosis Date   Anemia    Asthma    only as a child   Cancer ([place])    Prostate   Diabetes mellitus without complication ([place] Eric Brady)    Hyperlipidemia    Squamous cell carcinoma of skin 08/12/2019   in situ on right ear rim(CX3&EXC)     No Known Allergies   Current Outpatient Medications  Medication Sig Dispense Refill   aspirin EC 81 MG tablet Take 81 mg by mouth daily. Swallow whole.     atorvastatin (LIPITOR) 10 MG tablet Take 10 mg by mouth at bedtime.      calcium-vitamin D (OSCAL WITH D) 500-200 MG-UNIT tablet Take 2 tablets by mouth daily.      cholecalciferol (VITAMIN D3) 25 MCG (1000 UNIT) tablet Take 1,000 Units by mouth daily.     cyanocobalamin (,VITAMIN B-12,) 1000 MCG/ML injection Inject 1,000 mcg into the muscle every 30 (thirty) days.     doxycycline (VIBRA-TABS) 100 MG tablet Take 1 tablet (100 mg total) by mouth 2 (two) times daily. 20 tablet 0   doxycycline (VIBRA-TABS) 100 MG tablet Take 1 tablet (100 mg total) by mouth 2 (two) times daily. 20 tablet 0   EPINEPHrine (EPIPEN 2-PAK) 0.3 mg/0.3 mL IJ SOAJ injection Inject 0.3 mg into the muscle once as needed for anaphylaxis. 2 each 1   fludrocortisone (FLORINEF) 0.1 MG tablet TAKE 1 TABLET BY MOUTH ONCE DAILY. 90  tablet 1   HYDROcodone-acetaminophen (VICODIN) 5-325 mg TABS tablet Take one tablet by mouth every six hours as needed for pain 6 tablet 0   metFORMIN (GLUCOPHAGE-XR) 500 MG 24 hr tablet Take 1,000 mg by mouth at bedtime.   11   midodrine (PROAMATINE) 10 MG tablet Take 10 mg by mouth 3 (three) times daily.     ONETOUCH VERIO test strip 1 each daily.     pioglitazone (ACTOS) 15 MG tablet Take 15 mg by mouth daily.     traMADol (ULTRAM) 50 MG tablet Take 1 tablet (50 mg total) by mouth every 6 (six) hours as needed. 12 tablet 0   No current facility-administered medications for this visit.     Past Surgical History:  Procedure Laterality Date   COLONOSCOPY N/A 09/12/2012   Procedure: COLONOSCOPY;  Surgeon: Eric Houston, MD;  Location: [facility];  Service: Endoscopy;  Laterality: N/A;  915   EYE SURGERY     ORIF ANKLE FRACTURE Right    YAG LASER APPLICATION Left 7/98/9211   Procedure: YAG LASER APPLICATION;  Surgeon: Eric Che, MD;  Location: [facility] ORS;  Service: Ophthalmology;  Laterality: Left;     No Known Allergies    Family History  Problem Relation Age  of Onset   Cancer Mother    Prostate cancer Brother      Social History Eric Brady reports that he quit smoking about 28 years ago. He has never used smokeless tobacco. Eric Brady reports no history of alcohol use.   Review of Systems CONSTITUTIONAL: No weight loss, fever, chills, weakness or fatigue.  HEENT: Eyes: No visual loss, blurred vision, double vision or yellow sclerae.No hearing loss, sneezing, congestion, runny nose or sore throat.  SKIN: No rash or itching.  CARDIOVASCULAR: per hpi RESPIRATORY: No shortness of breath, cough or sputum.  GASTROINTESTINAL: No anorexia, nausea, vomiting or diarrhea. No abdominal pain or blood.  GENITOURINARY: No burning on urination, no polyuria NEUROLOGICAL: No headache, dizziness, syncope, paralysis, ataxia, numbness or tingling in the extremities. No change in  bowel or bladder control.  MUSCULOSKELETAL: No muscle, back pain, joint pain or stiffness.  LYMPHATICS: No enlarged nodes. No history of splenectomy.  PSYCHIATRIC: No history of depression or anxiety.  ENDOCRINOLOGIC: No reports of sweating, cold or heat intolerance. No polyuria or polydipsia.  Marland Kitchen   Physical Examination Today's Vitals   09/15/20 1325  BP: 128/72  Pulse: 62  SpO2: 97%  Weight: 184 lb (83.5 kg)  Height: 5\' 11"  (1.803 m)   Body mass index is 25.66 kg/m.  Gen: resting comfortably, no acute distress HEENT: no scleral icterus, pupils equal round and reactive, no palptable cervical adenopathy,  CV: RRR, n m/r/g, no jvd Resp: Clear to auscultation bilaterally GI: abdomen is soft, non-tender, non-distended, normal bowel sounds, no hepatosplenomegaly MSK: extremities are warm, no edema.  Skin: warm, no rash Neuro:  no focal deficits Psych: appropriate affect   Diagnostic Studies 02/2018 echo Study Conclusions   - Left ventricle: The cavity size was normal. Systolic function was   normal. The estimated ejection fraction was in the range of 60%   to 65%. Wall motion was normal; there were no regional wall   motion abnormalities. Left ventricular diastolic function   parameters were normal. - Aortic valve: There was very mild stenosis. Valve area (Vmax):   2.29 cm^2. - Mitral valve: Moderately calcified annulus. Moderately thickened   leaflets . - Left atrium: The atrium was mildly dilated. - Atrial septum: No defect or patent foramen ovale was identified.    Assessment and Plan  1. Dizziness/Orhotstatic hypotension - some recebnt symptoms, we will increase his florinef to 0.2mg  daily, continue midodrine 10mg  tid. Continue aggressive hydration and sodium intake.         Eric Brady, M.D.

## 2020-09-16 ENCOUNTER — Ambulatory Visit (HOSPITAL_COMMUNITY)
Admission: RE | Admit: 2020-09-16 | Discharge: 2020-09-16 | Disposition: A | Discharge status: Home / Self Care | Payer: Medicare Other | Source: Ambulatory Visit | Attending: Internal Medicine | Admitting: Internal Medicine

## 2020-09-16 DIAGNOSIS — M5416 Radiculopathy, lumbar region: Secondary | ICD-10-CM | POA: Diagnosis not present

## 2020-09-16 DIAGNOSIS — M545 Low back pain, unspecified: Secondary | ICD-10-CM | POA: Diagnosis not present

## 2020-09-22 DIAGNOSIS — E1122 Type 2 diabetes mellitus with diabetic chronic kidney disease: Secondary | ICD-10-CM | POA: Diagnosis not present

## 2020-09-22 DIAGNOSIS — N1832 Chronic kidney disease, stage 3b: Secondary | ICD-10-CM | POA: Diagnosis not present

## 2020-09-22 DIAGNOSIS — M5416 Radiculopathy, lumbar region: Secondary | ICD-10-CM | POA: Diagnosis not present

## 2020-09-22 DIAGNOSIS — D649 Anemia, unspecified: Secondary | ICD-10-CM | POA: Diagnosis not present

## 2020-09-29 DIAGNOSIS — M5126 Other intervertebral disc displacement, lumbar region: Secondary | ICD-10-CM | POA: Diagnosis not present

## 2020-10-12 ENCOUNTER — Other Ambulatory Visit: Payer: Self-pay | Admitting: Neurosurgery

## 2020-10-12 DIAGNOSIS — D519 Vitamin B12 deficiency anemia, unspecified: Secondary | ICD-10-CM | POA: Diagnosis not present

## 2020-10-21 ENCOUNTER — Telehealth: Payer: Self-pay | Admitting: Cardiology

## 2020-10-21 NOTE — Telephone Encounter (Signed)
   [facility] Pre-operative Risk Assessment    Patient Name: Eric Brady  DOB: Oct 27, 1932 MRN: [id number]  HEARTCARE STAFF:  - IMPORTANT!!!!!! Under Visit Info/Reason for Call, type in Other and utilize the format Clearance MM/DD/YY or Clearance TBD. Do not use dashes or single digits. - Please review there is not already an duplicate clearance open for this procedure. - If request is for dental extraction, please clarify the # of teeth to be extracted. - If the patient is currently at the dentist's office, call Pre-Op Callback Staff (MA/nurse) to input urgent request.  - If the patient is not currently in the dentist office, please route to the Pre-Op pool.  Request for surgical clearance:  What type of surgery is being performed? L3-4 Lumbar Microdisketomy  When is this surgery scheduled? 10/26/20  What type of clearance is required (medical clearance vs. Pharmacy clearance to hold med vs. Both)? both  Are there any medications that need to be held prior to surgery and how long? Please advise   Practice name and name of physician performing surgery? Dr Kary Kos  What is the office phone number? [phone]   7.   What is the office fax number? [phone]  8.   Anesthesia type (None, local, MAC, general) ? general  Jannet Askew 10/21/2020, 8:21 AM  _________________________________________________________________   (provider comments below)

## 2020-10-21 NOTE — Pre-Procedure Instructions (Signed)
Surgical Instructions    Your procedure is scheduled on Monday, 10/26/2020.  Report to [facility] Main Entrance "A" at 12:40 P.M., then check in with the Admitting office.  Call this number if you have problems the morning of surgery:  [phone]   If you have any questions prior to your surgery date call [phone]: Open Monday-Friday 8am-4pm    Remember:  Do not eat or drink after midnight the night before your surgery   Take these medicines the morning of surgery with A SIP OF WATER: atorvastatin (LIPITOR) fludrocortisone (FLORINEF) midodrine (PROAMATINE)   Take these medications AS NEEDED: EPINEPHrine (EPIPEN 2-PAK) - please let your nurse know if you had to use this medication HYDROcodone-acetaminophen (VICODIN) traMADol (ULTRAM)  As of today, STOP taking any Aspirin (unless otherwise instructed by your surgeon) Aleve, Naproxen, Ibuprofen, Motrin, Advil, Goody's, BC's, all herbal medications, fish oil, and all vitamins.  WHAT DO I DO ABOUT MY DIABETES MEDICATION?   Do not take metFORMIN (GLUCOPHAGE-XR) or pioglitazone (ACTOS) the morning of surgery.   HOW TO MANAGE YOUR DIABETES BEFORE AND AFTER SURGERY  Why is it important to control my blood sugar before and after surgery? Improving blood sugar levels before and after surgery helps healing and can limit problems. A way of improving blood sugar control is eating a healthy diet by:  Eating less sugar and carbohydrates  Increasing activity/exercise  Talking with your doctor about reaching your blood sugar goals High blood sugars (greater than 180 mg/dL) can raise your risk of infections and slow your recovery, so you will need to focus on controlling your diabetes during the weeks before surgery. Make sure that the doctor who takes care of your diabetes knows about your planned surgery including the date and location.  How do I manage my blood sugar before surgery? Check your blood sugar at least 4 times a day,  starting 2 days before surgery, to make sure that the level is not too high or low.  Check your blood sugar the morning of your surgery when you wake up and every 2 hours until you get to the Short Stay unit.  If your blood sugar is less than 70 mg/dL, you will need to treat for low blood sugar: Do not take insulin. Treat a low blood sugar (less than 70 mg/dL) with  cup of clear juice (cranberry or apple), 4 glucose tablets, OR glucose gel. Recheck blood sugar in 15 minutes after treatment (to make sure it is greater than 70 mg/dL). If your blood sugar is not greater than 70 mg/dL on recheck, call [phone] for further instructions. Report your blood sugar to the short stay nurse when you get to Short Stay.  If you are admitted to the hospital after surgery: Your blood sugar will be checked by the staff and you will probably be given insulin after surgery (instead of oral diabetes medicines) to make sure you have good blood sugar levels. The goal for blood sugar control after surgery is 80-180 mg/dL.           Do not wear jewelry. Do not wear lotions, powders, colognes, or deodorant. Men may shave face and neck. Do not bring valuables to the hospital.             [facility] is not responsible for any belongings or valuables.  Do NOT Smoke (Tobacco/Vaping) or drink Alcohol 24 hours prior to your procedure If you use a CPAP at night, you may bring all equipment for your  overnight stay.   Contacts, glasses, dentures or bridgework may not be worn into surgery, please bring cases for these belongings   For patients admitted to the hospital, discharge time will be determined by your treatment team.   Patients discharged the day of surgery will not be allowed to drive home, and someone needs to stay with them for 24 hours.  ONLY 1 SUPPORT PERSON MAY BE PRESENT WHILE YOU ARE IN SURGERY. IF YOU ARE TO BE ADMITTED ONCE YOU ARE IN YOUR ROOM YOU WILL BE ALLOWED TWO (2) VISITORS.  Minor  children may have two parents present. Special consideration for safety and communication needs will be reviewed on a case by case basis.  Special instructions:    Oral Hygiene is also important to reduce your risk of infection.  Remember - BRUSH YOUR TEETH THE MORNING OF SURGERY WITH YOUR REGULAR TOOTHPASTE   - Preparing For Surgery  Before surgery, you can play an important role. Because skin is not sterile, your skin needs to be as free of germs as possible. You can reduce the number of germs on your skin by washing with CHG (chlorahexidine gluconate) Soap before surgery.  CHG is an antiseptic cleaner which kills germs and bonds with the skin to continue killing germs even after washing.     Please do not use if you have an allergy to CHG or antibacterial soaps. If your skin becomes reddened/irritated stop using the CHG.  Do not shave (including legs and underarms) for at least 48 hours prior to first CHG shower. It is OK to shave your face.  Please follow these instructions carefully.     Shower the NIGHT BEFORE SURGERY and the MORNING OF SURGERY with CHG Soap.   If you chose to wash your hair, wash your hair first as usual with your normal shampoo. After you shampoo, rinse your hair and body thoroughly to remove the shampoo.  Then [employer] and genitals (private parts) with your normal soap and rinse thoroughly to remove soap.  After that Use CHG Soap as you would any other liquid soap. You can apply CHG directly to the skin and wash gently with a scrungie or a clean washcloth.   Apply the CHG Soap to your body ONLY FROM THE NECK DOWN.  Do not use on open wounds or open sores. Avoid contact with your eyes, ears, mouth and genitals (private parts). Wash Face and genitals (private parts)  with your normal soap.   Wash thoroughly, paying special attention to the area where your surgery will be performed.  Thoroughly rinse your body with warm water from the neck down.  DO NOT  shower/wash with your normal soap after using and rinsing off the CHG Soap.  Pat yourself dry with a CLEAN TOWEL.  Wear CLEAN PAJAMAS to bed the night before surgery  Place CLEAN SHEETS on your bed the night before your surgery  DO NOT SLEEP WITH PETS.   Day of Surgery:  Take a shower with CHG soap. Wear Clean/Comfortable clothing the morning of surgery Do not apply any deodorants/lotions.   Remember to brush your teeth WITH YOUR REGULAR TOOTHPASTE.   Please read over the following fact sheets that you were given.

## 2020-10-21 NOTE — Telephone Encounter (Signed)
   Patient Name: Eric Brady  DOB: 1932-11-22 MRN: [id number]  Primary Cardiologist: Carlyle Dolly, MD  Chart reviewed as part of pre-operative protocol coverage. Request is for L3-4 lumbar microdiscectomy 10/26/20.  Pt has history of dizziness/ orthostatic hypotension and bradycardia without significant pauses or high grade AV block by prior monitor, HLD, CKD, and DM2.   Pt was last seen by Dr. Harl Bowie 09/15/20 at which time he noted some recent near syncope standing. Florinef was increased to 0.2mg  daily and pt continued on midodrine.   Will route to Dr. Harl Bowie for clarification regarding antiplatelet tx.  Dr Harl Bowie: Please advise if the above patient should hold ASA and for how long before his upcoming procedure as outlined below. Please route your response back to cv div preop pool.   Arvil Chaco, PA-C 10/21/2020, 4:58 PM

## 2020-10-22 ENCOUNTER — Other Ambulatory Visit: Payer: Self-pay

## 2020-10-22 ENCOUNTER — Encounter (HOSPITAL_COMMUNITY)
Admission: RE | Admit: 2020-10-22 | Discharge: 2020-10-22 | Disposition: A | Discharge status: Home / Self Care | Payer: Medicare Other | Source: Ambulatory Visit | Attending: Neurosurgery | Admitting: Neurosurgery

## 2020-10-22 ENCOUNTER — Encounter (HOSPITAL_COMMUNITY): Payer: Self-pay

## 2020-10-22 DIAGNOSIS — Z20822 Contact with and (suspected) exposure to covid-19: Secondary | ICD-10-CM | POA: Insufficient documentation

## 2020-10-22 DIAGNOSIS — Z01812 Encounter for preprocedural laboratory examination: Secondary | ICD-10-CM | POA: Insufficient documentation

## 2020-10-22 HISTORY — DX: Personal history of irradiation: Z92.3

## 2020-10-22 LAB — CBC
HCT: 34.7 % — ABNORMAL LOW (ref 39.0–52.0)
Hemoglobin: 10.5 g/dL — ABNORMAL LOW (ref 13.0–17.0)
MCH: 28.5 pg (ref 26.0–34.0)
MCHC: 30.3 g/dL (ref 30.0–36.0)
MCV: 94.3 fL (ref 80.0–100.0)
Platelets: 145 10*3/uL — ABNORMAL LOW (ref 150–400)
RBC: 3.68 MIL/uL — ABNORMAL LOW (ref 4.22–5.81)
RDW: 15.2 % (ref 11.5–15.5)
WBC: 6.2 10*3/uL (ref 4.0–10.5)
nRBC: 0 % (ref 0.0–0.2)

## 2020-10-22 LAB — BASIC METABOLIC PANEL
Anion gap: 10 (ref 5–15)
BUN: 24 mg/dL — ABNORMAL HIGH (ref 8–23)
CO2: 25 mmol/L (ref 22–32)
Calcium: 9.2 mg/dL (ref 8.9–10.3)
Chloride: 109 mmol/L (ref 98–111)
Creatinine, Ser: 1.74 mg/dL — ABNORMAL HIGH (ref 0.61–1.24)
GFR, Estimated: 38 mL/min — ABNORMAL LOW (ref >60)
Glucose, Bld: 154 mg/dL — ABNORMAL HIGH (ref 70–99)
Potassium: 3.6 mmol/L (ref 3.5–5.1)
Sodium: 144 mmol/L (ref 135–145)

## 2020-10-22 LAB — SURGICAL PCR SCREEN
MRSA, PCR: NEGATIVE
Staphylococcus aureus: NEGATIVE

## 2020-10-22 LAB — GLUCOSE, CAPILLARY: Glucose-Capillary: 145 mg/dL — ABNORMAL HIGH (ref 70–99)

## 2020-10-22 LAB — HEMOGLOBIN A1C
Hgb A1c MFr Bld: 7.8 % — ABNORMAL HIGH (ref 4.8–5.6)
Mean Plasma Glucose: 177.16 mg/dL

## 2020-10-22 LAB — SARS CORONAVIRUS 2 (TAT 6-24 HRS): SARS Coronavirus 2: NEGATIVE

## 2020-10-22 NOTE — Progress Notes (Addendum)
PCP - Asencion Noble Nelson, MD  PPM/ICD - n/a  Chest x-ray - 03/07/20 EKG - 08/10/20 Stress Test - n/a ECHO - 02/23/18 Cardiac Cath - n/a  Sleep Study - pt denies  Fasting Blood Sugar - reports it is normally around 125; 145 in PAT Checks Blood Sugar once a day  Blood Thinner Instructions: n/a Aspirin Instructions: n/a  NPO after midnight  COVID TEST- 10/22/20 in PAT  Anesthesia review: yes, creatine of 1.74  Patient denies shortness of breath, fever, cough and chest pain at PAT appointment   All instructions explained to the patient, with a verbal understanding of the material. Patient agrees to go over the instructions while at home for a better understanding. Patient also instructed to self quarantine after being tested for COVID-19. The opportunity to ask questions was provided.

## 2020-10-23 NOTE — Anesthesia Preprocedure Evaluation (Addendum)
Anesthesia Evaluation  Patient identified by MRN, date of birth, ID band Patient awake    Reviewed: Allergy & Precautions, NPO status , Patient's Chart, lab work & pertinent test results  Airway Mallampati: I  TM Distance: >3 FB Neck ROM: Full    Dental  (+) Missing, Dental Advisory Given   Pulmonary asthma , former smoker,    Pulmonary exam normal breath sounds clear to auscultation       Cardiovascular Normal cardiovascular exam Rhythm:Regular Rate:Normal  ECG: SR, rate 53  Orthostatic hypotension  Pre-op eval per cardiology    Neuro/Psych negative neurological ROS  negative psych ROS   GI/Hepatic negative GI ROS, Neg liver ROS,   Endo/Other  diabetes, Oral Hypoglycemic Agents  Renal/GU Renal disease     Musculoskeletal negative musculoskeletal ROS (+)   Abdominal   Peds  Hematology  (+) anemia , HLD   Anesthesia Other Findings HNP  Reproductive/Obstetrics                         Anesthesia Physical Anesthesia Plan  ASA: 2  Anesthesia Plan: General   Post-op Pain Management:    Induction: Intravenous  PONV Risk Score and Plan: 2 and Ondansetron, Dexamethasone and Treatment may vary due to age or medical condition  Airway Management Planned: Oral ETT  Additional Equipment:   Intra-op Plan:   Post-operative Plan: Extubation in OR  Informed Consent: I have reviewed the patients History and Physical, chart, labs and discussed the procedure including the risks, benefits and alternatives for the proposed anesthesia with the patient or authorized representative who has indicated his/her understanding and acceptance.     Dental advisory given  Plan Discussed with: CRNA  Anesthesia Plan Comments: (Reviewed PAT note by Karoline Caldwell, PA-C: Follows with cardiology for history of orthostatic hypotension, sinus bradycardia.  He is maintained on midodrine and Florinef.  Last seen by  Dr. Harl Bowie 09/15/2020. Noted some recent symptoms his Florinef was increased to 40 mg daily, continue midodrine 10 mg 3 times a day, continue aggressive hydration and sodium intake.  DM2 with suboptimal control.  A1c 7.8 on preop labs.  History of renal sufficiency.  Review of labs over the past 2 years shows creatinine ranged from 1.35-1.81.  Creatinine 1.74 on preop labs.  Mild anemia with hemoglobin 10.5.  Remainder of labs unremarkable.  EKG 08/09/2020: Sinus rhythm.  Rate 53.  Nonspecific T abnormalities, lateral leads.  No significant change.  Event monitor 03/22/2019: . 14 day holter monitor . Min HR 40, Max HR 96, Avg HR 52 . Rare supraventricular ectopy in the form of isolated PACs and couplets . Rare ventricular ectopy in the form of isolated PVCs, couplets, trigeminy . Predominant rhythm was sinus bradycardia. No significant arrhythmias . Reported symptoms correlated with sinus rhythm and mild sinus bradycardia  TTE 02/23/2018: - Left ventricle: The cavity size was normal. Systolic function was  normal. The estimated ejection fraction was in the range of 60%  to 65%. Wall motion was normal; there were no regional wall  motion abnormalities. Left ventricular diastolic function  parameters were normal.  - Aortic valve: There was very mild stenosis. Valve area (Vmax):  2.29 cm^2.  - Mitral valve: Moderately calcified annulus. Moderately thickened  leaflets .  - Left atrium: The atrium was mildly dilated.  - Atrial septum: No defect or patent foramen ovale was identified. )      Anesthesia Quick Evaluation

## 2020-10-23 NOTE — Progress Notes (Signed)
Anesthesia Chart Review:  Follows with cardiology for history of orthostatic hypotension, sinus bradycardia.  He is maintained on midodrine and Florinef.  Last seen by Dr. Harl Bowie 09/15/2020. Noted some recent symptoms his Florinef was increased to 40 mg daily, continue midodrine 10 mg 3 times a day, continue aggressive hydration and sodium intake.  DM2 with suboptimal control.  A1c 7.8 on preop labs.  History of renal sufficiency.  Review of labs over the past 2 years shows creatinine ranged from 1.35-1.81.  Creatinine 1.74 on preop labs.  Mild anemia with hemoglobin 10.5.  Remainder of labs unremarkable.  EKG 08/09/2020: Sinus rhythm.  Rate 53.  Nonspecific T abnormalities, lateral leads.  No significant change.  Event monitor 03/22/2019: 14 day holter monitor Min HR 40, Max HR 96, Avg HR 52 Rare supraventricular ectopy in the form of isolated PACs and couplets Rare ventricular ectopy in the form of isolated PVCs, couplets, trigeminy Predominant rhythm was sinus bradycardia. No significant arrhythmias Reported symptoms correlated with sinus rhythm and mild sinus bradycardia  TTE 02/23/2018: - Left ventricle: The cavity size was normal. Systolic function was    normal. The estimated ejection fraction was in the range of 60%    to 65%. Wall motion was normal; there were no regional wall    motion abnormalities. Left ventricular diastolic function    parameters were normal.  - Aortic valve: There was very mild stenosis. Valve area (Vmax):    2.29 cm^2.  - Mitral valve: Moderately calcified annulus. Moderately thickened    leaflets .  - Left atrium: The atrium was mildly dilated.  - Atrial septum: No defect or patent foramen ovale was identified.   Wynonia Musty [facility] Short Stay Center/Anesthesiology Phone [phone] 10/23/2020 1:10 PM

## 2020-10-26 ENCOUNTER — Encounter (HOSPITAL_COMMUNITY): Payer: Self-pay | Admitting: Neurosurgery

## 2020-10-26 ENCOUNTER — Ambulatory Visit (HOSPITAL_COMMUNITY): Payer: Medicare Other | Admitting: Physician Assistant

## 2020-10-26 ENCOUNTER — Encounter (HOSPITAL_COMMUNITY)
Admission: RE | Disposition: A | Discharge status: Home / Self Care | Payer: Self-pay | Source: Home / Self Care | Attending: Neurosurgery

## 2020-10-26 ENCOUNTER — Ambulatory Visit (HOSPITAL_COMMUNITY)
Admission: RE | Admit: 2020-10-26 | Discharge: 2020-10-27 | Disposition: A | Discharge status: Home / Self Care | Payer: Medicare Other | Attending: Neurosurgery | Admitting: Neurosurgery

## 2020-10-26 ENCOUNTER — Ambulatory Visit (HOSPITAL_COMMUNITY): Payer: Medicare Other

## 2020-10-26 ENCOUNTER — Other Ambulatory Visit: Payer: Self-pay

## 2020-10-26 ENCOUNTER — Ambulatory Visit (HOSPITAL_COMMUNITY): Payer: Medicare Other | Admitting: Anesthesiology

## 2020-10-26 DIAGNOSIS — Z419 Encounter for procedure for purposes other than remedying health state, unspecified: Secondary | ICD-10-CM

## 2020-10-26 DIAGNOSIS — Z7982 Long term (current) use of aspirin: Secondary | ICD-10-CM | POA: Diagnosis not present

## 2020-10-26 DIAGNOSIS — M5116 Intervertebral disc disorders with radiculopathy, lumbar region: Secondary | ICD-10-CM | POA: Diagnosis not present

## 2020-10-26 DIAGNOSIS — M5126 Other intervertebral disc displacement, lumbar region: Secondary | ICD-10-CM | POA: Insufficient documentation

## 2020-10-26 DIAGNOSIS — Z87891 Personal history of nicotine dependence: Secondary | ICD-10-CM | POA: Insufficient documentation

## 2020-10-26 DIAGNOSIS — Z981 Arthrodesis status: Secondary | ICD-10-CM | POA: Diagnosis not present

## 2020-10-26 DIAGNOSIS — Z8546 Personal history of malignant neoplasm of prostate: Secondary | ICD-10-CM | POA: Diagnosis not present

## 2020-10-26 DIAGNOSIS — Z923 Personal history of irradiation: Secondary | ICD-10-CM | POA: Insufficient documentation

## 2020-10-26 DIAGNOSIS — Z7984 Long term (current) use of oral hypoglycemic drugs: Secondary | ICD-10-CM | POA: Insufficient documentation

## 2020-10-26 DIAGNOSIS — M48061 Spinal stenosis, lumbar region without neurogenic claudication: Secondary | ICD-10-CM | POA: Diagnosis not present

## 2020-10-26 DIAGNOSIS — Z8042 Family history of malignant neoplasm of prostate: Secondary | ICD-10-CM | POA: Insufficient documentation

## 2020-10-26 DIAGNOSIS — E119 Type 2 diabetes mellitus without complications: Secondary | ICD-10-CM | POA: Insufficient documentation

## 2020-10-26 DIAGNOSIS — Z4889 Encounter for other specified surgical aftercare: Secondary | ICD-10-CM | POA: Diagnosis not present

## 2020-10-26 DIAGNOSIS — M4326 Fusion of spine, lumbar region: Secondary | ICD-10-CM | POA: Diagnosis not present

## 2020-10-26 DIAGNOSIS — Z79899 Other long term (current) drug therapy: Secondary | ICD-10-CM | POA: Insufficient documentation

## 2020-10-26 DIAGNOSIS — E785 Hyperlipidemia, unspecified: Secondary | ICD-10-CM | POA: Diagnosis not present

## 2020-10-26 HISTORY — PX: LUMBAR LAMINECTOMY/DECOMPRESSION MICRODISCECTOMY: SHX5026

## 2020-10-26 LAB — GLUCOSE, CAPILLARY
Glucose-Capillary: 164 mg/dL — ABNORMAL HIGH (ref 70–99)
Glucose-Capillary: 134 mg/dL — ABNORMAL HIGH (ref 70–99)
Glucose-Capillary: 216 mg/dL — ABNORMAL HIGH (ref 70–99)
Glucose-Capillary: 309 mg/dL — ABNORMAL HIGH (ref 70–99)

## 2020-10-26 SURGERY — LUMBAR LAMINECTOMY/DECOMPRESSION MICRODISCECTOMY 1 LEVEL
Anesthesia: General | Site: Back | Laterality: Right

## 2020-10-26 MED ORDER — ALUM & MAG HYDROXIDE-SIMETH 200-200-20 MG/5ML PO SUSP: 30.0000 mL | Freq: Four times a day (QID) | ORAL | Status: DC | PRN

## 2020-10-26 MED ORDER — THROMBIN 5000 UNITS EX SOLR
CUTANEOUS | Status: AC
  Filled 2020-10-26: qty 10000

## 2020-10-26 MED ORDER — BUPIVACAINE HCL (PF) 0.25 % IJ SOLN
INTRAMUSCULAR | Status: DC | PRN
  Administered 2020-10-26: 10 mL

## 2020-10-26 MED ORDER — ONDANSETRON HCL 4 MG PO TABS: 4.0000 mg | ORAL_TABLET | Freq: Four times a day (QID) | ORAL | Status: DC | PRN

## 2020-10-26 MED ORDER — ONDANSETRON HCL 4 MG/2ML IJ SOLN
INTRAMUSCULAR | Status: DC | PRN
  Administered 2020-10-26: 4 mg via INTRAVENOUS

## 2020-10-26 MED ORDER — ATORVASTATIN CALCIUM 10 MG PO TABS
10.0000 mg | ORAL_TABLET | Freq: Every evening | ORAL | Status: DC
  Administered 2020-10-26: 10 mg via ORAL
  Filled 2020-10-26: qty 1

## 2020-10-26 MED ORDER — CALCIUM CARBONATE-VITAMIN D 500-200 MG-UNIT PO TABS
2.0000 | ORAL_TABLET | Freq: Every day | ORAL | Status: DC
  Administered 2020-10-26 – 2020-10-27 (×2): 2 via ORAL
  Filled 2020-10-26 (×2): qty 2

## 2020-10-26 MED ORDER — HYDROMORPHONE HCL 1 MG/ML IJ SOLN: 0.5000 mg | INTRAMUSCULAR | Status: DC | PRN

## 2020-10-26 MED ORDER — PROPOFOL 10 MG/ML IV BOLUS
INTRAVENOUS | Status: DC | PRN
  Administered 2020-10-26 (×2): 50 mg via INTRAVENOUS
  Administered 2020-10-26: 100 mg via INTRAVENOUS
  Administered 2020-10-26: 20 mg via INTRAVENOUS

## 2020-10-26 MED ORDER — CEFAZOLIN SODIUM-DEXTROSE 2-4 GM/100ML-% IV SOLN
2.0000 g | INTRAVENOUS | Status: AC
  Administered 2020-10-26: 2 g via INTRAVENOUS
  Filled 2020-10-26: qty 100

## 2020-10-26 MED ORDER — CEFAZOLIN SODIUM-DEXTROSE 2-4 GM/100ML-% IV SOLN
2.0000 g | Freq: Three times a day (TID) | INTRAVENOUS | Status: AC
Start: 2020-10-26 — End: 2020-10-27
  Administered 2020-10-26 – 2020-10-27 (×2): 2 g via INTRAVENOUS
  Filled 2020-10-26 (×2): qty 100

## 2020-10-26 MED ORDER — HYDRALAZINE HCL 20 MG/ML IJ SOLN
3.0000 mg | Freq: Once | INTRAMUSCULAR | Status: AC
  Administered 2020-10-26: 3 mg via INTRAVENOUS

## 2020-10-26 MED ORDER — ASPIRIN EC 81 MG PO TBEC
81.0000 mg | DELAYED_RELEASE_TABLET | Freq: Every day | ORAL | Status: DC
  Administered 2020-10-27: 81 mg via ORAL
  Filled 2020-10-26: qty 1

## 2020-10-26 MED ORDER — SUGAMMADEX SODIUM 200 MG/2ML IV SOLN
INTRAVENOUS | Status: DC | PRN
  Administered 2020-10-26: 175 mg via INTRAVENOUS

## 2020-10-26 MED ORDER — FENTANYL CITRATE (PF) 250 MCG/5ML IJ SOLN
INTRAMUSCULAR | Status: AC
  Filled 2020-10-26: qty 5

## 2020-10-26 MED ORDER — HYDRALAZINE HCL 20 MG/ML IJ SOLN
2.0000 mg | Freq: Once | INTRAMUSCULAR | Status: AC
  Administered 2020-10-26: 2 mg via INTRAVENOUS

## 2020-10-26 MED ORDER — PANTOPRAZOLE SODIUM 40 MG IV SOLR
40.0000 mg | Freq: Every day | INTRAVENOUS | Status: DC
  Administered 2020-10-26: 40 mg via INTRAVENOUS
  Filled 2020-10-26: qty 40

## 2020-10-26 MED ORDER — LIDOCAINE 2% (20 MG/ML) 5 ML SYRINGE
INTRAMUSCULAR | Status: AC
  Filled 2020-10-26: qty 5

## 2020-10-26 MED ORDER — ROCURONIUM BROMIDE 10 MG/ML (PF) SYRINGE
PREFILLED_SYRINGE | INTRAVENOUS | Status: AC
  Filled 2020-10-26: qty 10

## 2020-10-26 MED ORDER — FENTANYL CITRATE (PF) 100 MCG/2ML IJ SOLN: 25.0000 ug | INTRAMUSCULAR | Status: DC | PRN

## 2020-10-26 MED ORDER — MIDODRINE HCL 5 MG PO TABS
10.0000 mg | ORAL_TABLET | Freq: Three times a day (TID) | ORAL | Status: DC
  Filled 2020-10-26 (×4): qty 2

## 2020-10-26 MED ORDER — VITAMIN D 25 MCG (1000 UNIT) PO TABS
1000.0000 [IU] | ORAL_TABLET | Freq: Every day | ORAL | Status: DC
  Administered 2020-10-27: 1000 [IU] via ORAL
  Filled 2020-10-26: qty 1

## 2020-10-26 MED ORDER — ONDANSETRON HCL 4 MG/2ML IJ SOLN: 4.0000 mg | Freq: Once | INTRAMUSCULAR | Status: DC | PRN

## 2020-10-26 MED ORDER — ACETAMINOPHEN 500 MG PO TABS
1000.0000 mg | ORAL_TABLET | Freq: Once | ORAL | Status: AC
  Administered 2020-10-26: 1000 mg via ORAL
  Filled 2020-10-26: qty 2

## 2020-10-26 MED ORDER — 0.9 % SODIUM CHLORIDE (POUR BTL) OPTIME
TOPICAL | Status: DC | PRN
  Administered 2020-10-26: 1000 mL

## 2020-10-26 MED ORDER — ONDANSETRON HCL 4 MG/2ML IJ SOLN: 4.0000 mg | Freq: Four times a day (QID) | INTRAMUSCULAR | Status: DC | PRN

## 2020-10-26 MED ORDER — CHLORHEXIDINE GLUCONATE 0.12 % MT SOLN
15.0000 mL | Freq: Once | OROMUCOSAL | Status: AC
  Administered 2020-10-26: 15 mL via OROMUCOSAL
  Filled 2020-10-26: qty 15

## 2020-10-26 MED ORDER — FENTANYL CITRATE (PF) 250 MCG/5ML IJ SOLN
INTRAMUSCULAR | Status: DC | PRN
  Administered 2020-10-26: 150 ug via INTRAVENOUS

## 2020-10-26 MED ORDER — ACETAMINOPHEN 325 MG PO TABS: 650.0000 mg | ORAL_TABLET | ORAL | Status: DC | PRN

## 2020-10-26 MED ORDER — PHENOL 1.4 % MT LIQD: 1.0000 | OROMUCOSAL | Status: DC | PRN

## 2020-10-26 MED ORDER — BUPIVACAINE HCL (PF) 0.25 % IJ SOLN
INTRAMUSCULAR | Status: AC
  Filled 2020-10-26: qty 30

## 2020-10-26 MED ORDER — ORAL CARE MOUTH RINSE: 15.0000 mL | Freq: Once | OROMUCOSAL | Status: AC

## 2020-10-26 MED ORDER — PIOGLITAZONE HCL 15 MG PO TABS
15.0000 mg | ORAL_TABLET | Freq: Every evening | ORAL | Status: DC
  Filled 2020-10-26: qty 1

## 2020-10-26 MED ORDER — ROCURONIUM BROMIDE 10 MG/ML (PF) SYRINGE
PREFILLED_SYRINGE | INTRAVENOUS | Status: DC | PRN
  Administered 2020-10-26: 40 mg via INTRAVENOUS

## 2020-10-26 MED ORDER — LIDOCAINE-EPINEPHRINE 1 %-1:100000 IJ SOLN
INTRAMUSCULAR | Status: DC | PRN
  Administered 2020-10-26: 9 mL

## 2020-10-26 MED ORDER — PROPOFOL 10 MG/ML IV BOLUS
INTRAVENOUS | Status: AC
  Filled 2020-10-26: qty 20

## 2020-10-26 MED ORDER — MENTHOL 3 MG MT LOZG: 1.0000 | LOZENGE | OROMUCOSAL | Status: DC | PRN

## 2020-10-26 MED ORDER — CYCLOBENZAPRINE HCL 10 MG PO TABS: 10.0000 mg | ORAL_TABLET | Freq: Three times a day (TID) | ORAL | Status: DC | PRN

## 2020-10-26 MED ORDER — SODIUM CHLORIDE 0.9% FLUSH
3.0000 mL | Freq: Two times a day (BID) | INTRAVENOUS | Status: DC
  Administered 2020-10-26: 3 mL via INTRAVENOUS

## 2020-10-26 MED ORDER — ACETAMINOPHEN 650 MG RE SUPP: 650.0000 mg | RECTAL | Status: DC | PRN

## 2020-10-26 MED ORDER — SODIUM CHLORIDE 0.9 % IV SOLN: 250.0000 mL | INTRAVENOUS | Status: DC

## 2020-10-26 MED ORDER — FLUDROCORTISONE ACETATE 0.1 MG PO TABS
0.2000 mg | ORAL_TABLET | Freq: Every day | ORAL | Status: DC
  Filled 2020-10-26: qty 2

## 2020-10-26 MED ORDER — METFORMIN HCL ER 500 MG PO TB24: 1000.0000 mg | ORAL_TABLET | Freq: Every day | ORAL | Status: DC

## 2020-10-26 MED ORDER — ONDANSETRON HCL 4 MG/2ML IJ SOLN
INTRAMUSCULAR | Status: AC
  Filled 2020-10-26: qty 2

## 2020-10-26 MED ORDER — AMISULPRIDE (ANTIEMETIC) 5 MG/2ML IV SOLN: 10.0000 mg | Freq: Once | INTRAVENOUS | Status: DC | PRN

## 2020-10-26 MED ORDER — DEXAMETHASONE SODIUM PHOSPHATE 10 MG/ML IJ SOLN
10.0000 mg | Freq: Once | INTRAMUSCULAR | Status: AC
  Administered 2020-10-26: 10 mg via INTRAVENOUS
  Filled 2020-10-26: qty 1

## 2020-10-26 MED ORDER — DEXAMETHASONE SODIUM PHOSPHATE 10 MG/ML IJ SOLN
INTRAMUSCULAR | Status: AC
  Filled 2020-10-26: qty 1

## 2020-10-26 MED ORDER — CHLORHEXIDINE GLUCONATE CLOTH 2 % EX PADS: 6.0000 | MEDICATED_PAD | Freq: Once | CUTANEOUS | Status: DC

## 2020-10-26 MED ORDER — LIDOCAINE 2% (20 MG/ML) 5 ML SYRINGE
INTRAMUSCULAR | Status: DC | PRN
  Administered 2020-10-26: 60 mg via INTRAVENOUS

## 2020-10-26 MED ORDER — THROMBIN 5000 UNITS EX SOLR
CUTANEOUS | Status: DC | PRN
  Administered 2020-10-26 (×2): 5000 [IU] via TOPICAL

## 2020-10-26 MED ORDER — SODIUM CHLORIDE 0.9% FLUSH: 3.0000 mL | INTRAVENOUS | Status: DC | PRN

## 2020-10-26 MED ORDER — OXYCODONE HCL 5 MG PO TABS: 10.0000 mg | ORAL_TABLET | Freq: Four times a day (QID) | ORAL | Status: DC | PRN

## 2020-10-26 MED ORDER — CYANOCOBALAMIN 1000 MCG/ML IJ SOLN: 1000.0000 ug | INTRAMUSCULAR | Status: DC

## 2020-10-26 MED ORDER — LIDOCAINE-EPINEPHRINE 1 %-1:100000 IJ SOLN
INTRAMUSCULAR | Status: AC
  Filled 2020-10-26: qty 1

## 2020-10-26 MED ORDER — LACTATED RINGERS IV SOLN: INTRAVENOUS | Status: DC

## 2020-10-26 MED ORDER — HEMOSTATIC AGENTS (NO CHARGE) OPTIME
TOPICAL | Status: DC | PRN
  Administered 2020-10-26: 1 via TOPICAL

## 2020-10-26 SURGICAL SUPPLY — 62 items
ADH SKN CLS APL DERMABOND .7 (GAUZE/BANDAGES/DRESSINGS) ×1
APL SKNCLS STERI-STRIP NONHPOA (GAUZE/BANDAGES/DRESSINGS) ×1
BAG COUNTER SPONGE SURGICOUNT (BAG) ×2 IMPLANT
BAG SPNG CNTER NS LX DISP (BAG) ×1
BAND INSRT 18 STRL LF DISP RB (MISCELLANEOUS) ×2
BAND RUBBER #18 3X1/16 STRL (MISCELLANEOUS) ×4 IMPLANT
BENZOIN TINCTURE PRP APPL 2/3 (GAUZE/BANDAGES/DRESSINGS) ×2 IMPLANT
BLADE CLIPPER SURG (BLADE) IMPLANT
BLADE SURG 11 STRL SS (BLADE) ×2 IMPLANT
BUR CUTTER 7.0 ROUND (BURR) ×2 IMPLANT
BUR MATCHSTICK NEURO 3.0 LAGG (BURR) ×2 IMPLANT
CANISTER SUCT 3000ML PPV (MISCELLANEOUS) ×2 IMPLANT
CARTRIDGE OIL MAESTRO DRILL (MISCELLANEOUS) ×1 IMPLANT
DECANTER SPIKE VIAL GLASS SM (MISCELLANEOUS) ×2 IMPLANT
DERMABOND ADVANCED (GAUZE/BANDAGES/DRESSINGS) ×1
DERMABOND ADVANCED .7 DNX12 (GAUZE/BANDAGES/DRESSINGS) ×1 IMPLANT
DIFFUSER DRILL AIR PNEUMATIC (MISCELLANEOUS) ×2 IMPLANT
DRAPE HALF SHEET 40X57 (DRAPES) IMPLANT
DRAPE LAPAROTOMY 100X72X124 (DRAPES) ×2 IMPLANT
DRAPE MICROSCOPE LEICA (MISCELLANEOUS) ×2 IMPLANT
DRAPE POUCH INSTRU U-SHP 10X18 (DRAPES) ×1 IMPLANT
DRAPE SURG 17X23 STRL (DRAPES) ×2 IMPLANT
DRSG OPSITE POSTOP 4X6 (GAUZE/BANDAGES/DRESSINGS) ×1 IMPLANT
DURAPREP 26ML APPLICATOR (WOUND CARE) ×2 IMPLANT
ELECT BLADE 4.0 EZ CLEAN MEGAD (MISCELLANEOUS) ×2
ELECT REM PT RETURN 9FT ADLT (ELECTROSURGICAL) ×2
ELECTRODE BLDE 4.0 EZ CLN MEGD (MISCELLANEOUS) IMPLANT
ELECTRODE REM PT RTRN 9FT ADLT (ELECTROSURGICAL) ×1 IMPLANT
GAUZE 4X4 16PLY ~~LOC~~+RFID DBL (SPONGE) IMPLANT
GAUZE SPONGE 4X4 12PLY STRL (GAUZE/BANDAGES/DRESSINGS) ×2 IMPLANT
GLOVE EXAM NITRILE XL STR (GLOVE) IMPLANT
GLOVE SURG ENC MOIS LTX SZ7 (GLOVE) IMPLANT
GLOVE SURG ENC MOIS LTX SZ8 (GLOVE) ×2 IMPLANT
GLOVE SURG UNDER LTX SZ8.5 (GLOVE) ×2 IMPLANT
GLOVE SURG UNDER POLY LF SZ6.5 (GLOVE) ×3 IMPLANT
GLOVE SURG UNDER POLY LF SZ7 (GLOVE) IMPLANT
GLOVE SURG UNDER POLY LF SZ7.5 (GLOVE) ×1 IMPLANT
GOWN STRL REUS W/ TWL LRG LVL3 (GOWN DISPOSABLE) ×1 IMPLANT
GOWN STRL REUS W/ TWL XL LVL3 (GOWN DISPOSABLE) ×2 IMPLANT
GOWN STRL REUS W/TWL 2XL LVL3 (GOWN DISPOSABLE) IMPLANT
GOWN STRL REUS W/TWL LRG LVL3 (GOWN DISPOSABLE) ×2
GOWN STRL REUS W/TWL XL LVL3 (GOWN DISPOSABLE) ×4
KIT BASIN OR (CUSTOM PROCEDURE TRAY) ×2 IMPLANT
KIT TURNOVER KIT B (KITS) ×2 IMPLANT
NDL SPNL 20GX3.5 QUINCKE YW (NEEDLE) IMPLANT
NDL SPNL 22GX3.5 QUINCKE BK (NEEDLE) ×1 IMPLANT
NEEDLE HYPO 22GX1.5 SAFETY (NEEDLE) ×2 IMPLANT
NEEDLE SPNL 20GX3.5 QUINCKE YW (NEEDLE) IMPLANT
NEEDLE SPNL 22GX3.5 QUINCKE BK (NEEDLE) ×2 IMPLANT
NS IRRIG 1000ML POUR BTL (IV SOLUTION) ×2 IMPLANT
OIL CARTRIDGE MAESTRO DRILL (MISCELLANEOUS) ×2
PACK LAMINECTOMY NEURO (CUSTOM PROCEDURE TRAY) ×2 IMPLANT
SPONGE SURGIFOAM ABS GEL SZ50 (HEMOSTASIS) ×2 IMPLANT
STRIP CLOSURE SKIN 1/2X4 (GAUZE/BANDAGES/DRESSINGS) ×2 IMPLANT
SUT VIC AB 0 CT1 18XCR BRD8 (SUTURE) ×1 IMPLANT
SUT VIC AB 0 CT1 8-18 (SUTURE) ×2
SUT VIC AB 2-0 CT1 18 (SUTURE) ×2 IMPLANT
SUT VIC AB 4-0 PS2 18 (SUTURE) ×1 IMPLANT
SUT VICRYL 4-0 PS2 18IN ABS (SUTURE) ×2 IMPLANT
TOWEL GREEN STERILE (TOWEL DISPOSABLE) ×2 IMPLANT
TOWEL GREEN STERILE FF (TOWEL DISPOSABLE) ×2 IMPLANT
WATER STERILE IRR 1000ML POUR (IV SOLUTION) ×2 IMPLANT

## 2020-10-26 NOTE — H&P (Signed)
Eric Brady is an 87 y.o. male.   Chief Complaint: Back and right leg pain HPI: 87 year old gentleman with progressive worsening back and right hip and leg pain rating down L4 nerve root pattern work-up revealed a very large disc radiation with severe spinal stenosis at L3-4 with a free fragment migrating inferiorly on the right side displacing the right L4 nerve root.  Due to patient's progressive clinical syndrome imaging findings and failed conservative treatment I recommended lumbar microdiscectomy at L3-4 I extensively gone over the risks and benefits of that operation with him as well as perioperative course expectations of outcome and alternatives of surgery and he understands and agrees to proceed forward.  Past Medical History:  Diagnosis Date   Anemia    Asthma    only as a child   Cancer ([place])    Prostate   Diabetes mellitus without complication ([place])    History of radiation therapy    Hyperlipidemia    Squamous cell carcinoma of skin 08/12/2019   in situ on right ear rim(CX3&EXC)    Past Surgical History:  Procedure Laterality Date   COLONOSCOPY N/A 09/12/2012   Procedure: COLONOSCOPY;  Surgeon: Rogene Houston, MD;  Location: [facility];  Service: Endoscopy;  Laterality: N/A;  915   EYE SURGERY     ORIF ANKLE FRACTURE Right    YAG LASER APPLICATION Left 0/93/2355   Procedure: YAG LASER APPLICATION;  Surgeon: Williams Che, MD;  Location: [facility]S;  Service: Ophthalmology;  Laterality: Left;    Family History  Problem Relation Age of Onset   Cancer Mother    Prostate cancer Brother    Social History:  reports that he quit smoking about 29 years ago. His smoking use included cigarettes. He has never used smokeless tobacco. He reports that he does not drink alcohol and does not use drugs.  Allergies: No Known Allergies  Medications Prior to Admission  Medication Sig Dispense Refill   aspirin EC 81 MG tablet Take 81 mg by mouth daily. Swallow whole.      atorvastatin (LIPITOR) 10 MG tablet Take 10 mg by mouth every evening.     calcium-vitamin D (OSCAL WITH D) 500-200 MG-UNIT tablet Take 2 tablets by mouth daily.      cholecalciferol (VITAMIN D3) 25 MCG (1000 UNIT) tablet Take 1,000 Units by mouth daily.     cyanocobalamin (,VITAMIN B-12,) 1000 MCG/ML injection Inject 1,000 mcg into the muscle every 30 (thirty) days.     fludrocortisone (FLORINEF) 0.1 MG tablet Take 2 tablets (0.2 mg total) by mouth daily. 180 tablet 3   HYDROcodone-acetaminophen (VICODIN) 5-325 mg TABS tablet Take one tablet by mouth every six hours as needed for pain (Patient taking differently: Take 1 tablet by mouth every other day. Take one tablet by mouth every six hours as needed for pain) 6 tablet 0   metFORMIN (GLUCOPHAGE-XR) 500 MG 24 hr tablet Take 1,000 mg by mouth every evening.  11   midodrine (PROAMATINE) 10 MG tablet Take 10 mg by mouth 3 (three) times daily.     pioglitazone (ACTOS) 15 MG tablet Take 15 mg by mouth every evening.     EPINEPHrine (EPIPEN 2-PAK) 0.3 mg/0.3 mL IJ SOAJ injection Inject 0.3 mg into the muscle once as needed for anaphylaxis. 2 each 1   ONETOUCH VERIO test strip 1 each daily.     traMADol (ULTRAM) 50 MG tablet Take 1 tablet (50 mg total) by mouth every 6 (six) hours as  needed. (Patient not taking: Reported on 10/20/2020) 12 tablet 0    Results for orders placed or performed during the hospital encounter of 10/26/20 (from the past 48 hour(s))  Glucose, capillary     Status: Abnormal   Collection Time: 10/26/20 11:51 AM  Result Value Ref Range   Glucose-Capillary 164 (H) 70 - 99 mg/dL    Comment: Glucose reference range applies only to samples taken after fasting for at least 8 hours.   No results found.  Review of Systems  Musculoskeletal:  Positive for back pain.  Neurological:  Positive for numbness.   Blood pressure (!) 249/91, pulse (!) 56, temperature 98.6 F (37 C), temperature source Oral, resp. rate 18, height 5\' 11"   (1.803 m), weight 81.6 kg, SpO2 95 %. Physical Exam HENT:     Head: Normocephalic.     Right Ear: Tympanic membrane normal.     Nose: Nose normal.  Eyes:     Pupils: Pupils are equal, round, and reactive to light.  Cardiovascular:     Rate and Rhythm: Normal rate.  Pulmonary:     Effort: Pulmonary effort is normal.  Abdominal:     General: Abdomen is flat.  Musculoskeletal:        General: Normal range of motion.  Skin:    General: Skin is warm.  Neurological:     General: No focal deficit present.     Mental Status: He is alert.     Assessment/Plan 87 year old presents for lumbar microdiscectomy L3-4  Elaina Hoops, MD 10/26/2020, 12:36 PM

## 2020-10-26 NOTE — Op Note (Signed)
Operative diagnosis: Herniated nucleus pulposus L3-4 right  Postoperative diagnosis: Same  Procedure: Lumbar laminectomy microdiscectomy L3-4 on the right with microscopic discectomy and microscopic foraminotomies of the right L4 nerve root  Surgeon: Dominica Severin Corbet Hanley  Assistant: Nash Shearer  Anesthesia: General  EBL: Minimal  HPI: 87 year old gentleman with progressive worsening right hip and leg pain rating down L4 nerve root pattern work-up revealed a very large disc radiation with a large free fragment that spanned the posterior aspect of the L4 vertebral body from L3-4 down L4-5.  Radiology felt this was herniation with a superior fragment from 4 5 I felt there was a better chance it was coming inferiorly from 3 4.  Regardless I recommended a laminectomy and microdiscectomy for removal of the large fragment.  I extensively went over the risks and benefits of the operation with him as well as perioperative course expectations of outcome and alternatives of surgery and he understood and agreed to proceed forward.  Operative procedure: Patient was brought into the OR was induced under general anesthesia positioned prone on the Wilson frame his back was prepped and draped in routine sterile fashion.  Preoperative x-ray localized the appropriate level so after infiltration of 10 cc lidocaine with epi midline incision was made and Bovie electrocautery was used take down the subcu tissue and subperiosteal dissection was carried lamina of L3 and L4 on the right exposing the entirety of the L4 lamina and the inferior aspect of 3.  Intraoperative x-ray confirmed defecation appropriate level so utilizing high-speed drill the L4 lamina was drilled down medial facet complex and inferior aspect of L3.  Laminotomy was begun with a 2 and 3 Miller Kerrison punch.  Ligament flow was identified and removed in piecemeal fashion.  The operating microscope was draped and brought into the field and under microscopic  lamination further under biting meal guttural identification of the L3-4 disc space the L4 pedicle I extended the laminotomy to well below the L4 nerve root and the inferior edge of the L4 pedicle to the superior aspect of the 4 5 disc base.  Then immediately identified a large free fragment disc herniation so I utilizing the nerve hook fished out several large fragments from above the takeoff of the L4 nerve root then working underneath also working below the L4 nerve root and removed some additional fragments.  After virtually all the free fragment had been removed I inspected the 3 4 disc base felt that I did not need to cut into this disc base also inspected the superior aspect 4 5 disc base and felt the same.  I was easily able to pass a coronary dilator and ankle hockey-stick medial to the thecal sac superior laterally and all of the L4 foramen without resistance.  No further fragments were appreciated.  The wound was copiously irrigated meticulous hemostasis was maintained Gelfoam was ON top of the dura and the muscle fascia approximate layers with interrupted Vicryl skin was closed running 4 subcuticular Dermabond benzoin Steri-Strips and a sterile dressing was applied patient recovery room in stable condition.  At the end the case all needle counts and sponge counts were correct.

## 2020-10-26 NOTE — Telephone Encounter (Signed)
   Primary Cardiologist: Carlyle Dolly, MD  Chart reviewed as part of pre-operative protocol coverage. Given past medical history and time since last visit, based on ACC/AHA guidelines, Eric Brady would be at acceptable risk for the planned procedure without further cardiovascular testing.   His aspirin may be held 7 days prior to his surgery.  Please resume as soon as hemostasis is achieved at the discretion of the surgeon.  I will route this recommendation to the requesting party via Epic fax function and remove from pre-op pool.  Please call with questions.  Jossie Ng. Onelia Cadmus NP-C    10/26/2020, 12:28 PM [address] Office [phone] Fax [phone]

## 2020-10-26 NOTE — Transfer of Care (Signed)
Immediate Anesthesia Transfer of Care Note  Patient: RUBE SANCHEZ  Procedure(s) Performed: Microdiscectomy - Lumbar Three-Four Right (Right: Back)  Patient Location: PACU  Anesthesia Type:General  Level of Consciousness: drowsy and patient cooperative  Airway & Oxygen Therapy: Patient Spontanous Breathing and Patient connected to nasal cannula oxygen  Post-op Assessment: Report given to RN and Post -op Vital signs reviewed and stable  Post vital signs: Reviewed  Last Vitals:  Vitals Value Taken Time  BP 203/79 10/26/20 1442  Temp    Pulse 53 10/26/20 1445  Resp 15 10/26/20 1445  SpO2 99 % 10/26/20 1445  Vitals shown include unvalidated device data.  Last Pain:  Vitals:   10/26/20 1203  TempSrc:   PainSc: 0-No pain         Complications: No notable events documented.

## 2020-10-26 NOTE — Telephone Encounter (Signed)
Ok to proceed with surgery, can hold aspirin 7 days prior and resume day after surgery   J Karee Forge MD

## 2020-10-26 NOTE — Anesthesia Procedure Notes (Signed)
Procedure Name: Intubation Date/Time: 10/26/2020 12:58 PM Performed by: Jenne Campus, CRNA Pre-anesthesia Checklist: Patient identified, Emergency Drugs available, Suction available and Patient being monitored Patient Re-evaluated:Patient Re-evaluated prior to induction Oxygen Delivery Method: Circle System Utilized Preoxygenation: Pre-oxygenation with 100% oxygen Induction Type: IV induction Ventilation: Mask ventilation without difficulty Laryngoscope Size: Miller and 3 Grade View: Grade I Tube type: Oral Tube size: 7.5 mm Number of attempts: 1 Airway Equipment and Method: Stylet and Oral airway Placement Confirmation: ETT inserted through vocal cords under direct vision, positive ETCO2 and breath sounds checked- equal and bilateral Secured at: 21 cm Tube secured with: Tape Dental Injury: Teeth and Oropharynx as per pre-operative assessment

## 2020-10-26 NOTE — Progress Notes (Signed)
SBP in 200s. Per pt's son, pt took Midodrine twice prior to arrival-- earlier this morning ~ 6 AM and just before rolling into Short stay. Pt denies dizziness, lightheadedness, SHOB, or CP. No acute distress noted. Dr. Roanna Banning and Rushie Chestnut, CRNA made aware. No new orders at this time.

## 2020-10-27 ENCOUNTER — Encounter (HOSPITAL_COMMUNITY): Payer: Self-pay | Admitting: Neurosurgery

## 2020-10-27 DIAGNOSIS — Z79899 Other long term (current) drug therapy: Secondary | ICD-10-CM | POA: Diagnosis not present

## 2020-10-27 DIAGNOSIS — Z7982 Long term (current) use of aspirin: Secondary | ICD-10-CM | POA: Diagnosis not present

## 2020-10-27 DIAGNOSIS — M48061 Spinal stenosis, lumbar region without neurogenic claudication: Secondary | ICD-10-CM | POA: Diagnosis not present

## 2020-10-27 DIAGNOSIS — Z8042 Family history of malignant neoplasm of prostate: Secondary | ICD-10-CM | POA: Diagnosis not present

## 2020-10-27 DIAGNOSIS — Z7984 Long term (current) use of oral hypoglycemic drugs: Secondary | ICD-10-CM | POA: Diagnosis not present

## 2020-10-27 DIAGNOSIS — Z87891 Personal history of nicotine dependence: Secondary | ICD-10-CM | POA: Diagnosis not present

## 2020-10-27 DIAGNOSIS — E119 Type 2 diabetes mellitus without complications: Secondary | ICD-10-CM | POA: Diagnosis not present

## 2020-10-27 DIAGNOSIS — Z8546 Personal history of malignant neoplasm of prostate: Secondary | ICD-10-CM | POA: Diagnosis not present

## 2020-10-27 DIAGNOSIS — M5126 Other intervertebral disc displacement, lumbar region: Secondary | ICD-10-CM | POA: Diagnosis not present

## 2020-10-27 DIAGNOSIS — Z923 Personal history of irradiation: Secondary | ICD-10-CM | POA: Diagnosis not present

## 2020-10-27 LAB — GLUCOSE, CAPILLARY: Glucose-Capillary: 222 mg/dL — ABNORMAL HIGH (ref 70–99)

## 2020-10-27 MED ORDER — INSULIN ASPART 100 UNIT/ML IJ SOLN
0.0000 [IU] | Freq: Every day | INTRAMUSCULAR | Status: DC
Start: 2020-10-27 — End: 2020-10-27

## 2020-10-27 MED ORDER — CYCLOBENZAPRINE HCL 10 MG PO TABS: 10.0000 mg | ORAL_TABLET | Freq: Three times a day (TID) | ORAL | 0 refills | Status: DC | PRN

## 2020-10-27 MED ORDER — OXYCODONE HCL 10 MG PO TABS: 10.0000 mg | ORAL_TABLET | Freq: Four times a day (QID) | ORAL | 0 refills | Status: DC | PRN

## 2020-10-27 MED ORDER — INSULIN ASPART 100 UNIT/ML IJ SOLN
0.0000 [IU] | Freq: Three times a day (TID) | INTRAMUSCULAR | Status: DC
  Administered 2020-10-27: 5 [IU] via SUBCUTANEOUS

## 2020-10-27 NOTE — Anesthesia Postprocedure Evaluation (Signed)
Anesthesia Post Note  Patient: Eric Brady  Procedure(s) Performed: Microdiscectomy - Lumbar Three-Four Right (Right: Back)     Patient location during evaluation: PACU Anesthesia Type: General Level of consciousness: awake and alert Pain management: pain level controlled Vital Signs Assessment: post-procedure vital signs reviewed and stable Respiratory status: spontaneous breathing, nonlabored ventilation, respiratory function stable and patient connected to nasal cannula oxygen Cardiovascular status: blood pressure returned to baseline and stable Postop Assessment: no apparent nausea or vomiting Anesthetic complications: no   No notable events documented.  Last Vitals:  Vitals:   10/26/20 2313 10/27/20 0323  BP: (!) 142/63 (!) 177/75  Pulse: (!) 58 (!) 59  Resp: 18 20  Temp: 36.7 C 36.8 C  SpO2: 95% 96%    Last Pain:  Vitals:   10/27/20 0323  TempSrc: Oral  PainSc:                  March Rummage Saahas Hidrogo

## 2020-10-27 NOTE — Evaluation (Signed)
Occupational Therapy Evaluation Patient Details Name: Eric Brady MRN: [id number] DOB: 1932-06-07 Today's Date: 10/27/2020    History of Present Illness 87 y.o. male presenting 7/25 for elective lumbar microdiscetomy and laminectomy at L3-4 secondary to severe spinal stenosis with herniated nucleus pulposus. PMHx significant for prostate cancer s/p radiation, skin cancer, DM and HLD.   Clinical Impression   PTA patient was living alone in a private residence and was grossly Mod I with ADLs/IADLs with use of hurrycane. Son present at bedside reports patient uses RW PRN on "bad days". Patient currently functioning below baseline requiring Min A for LB dressing and Min guard for toileting tasks and functional mobility with cues for adherence to back precautions with patient only able to recall 1/3 at conclusion of session. Son present at bedside able to recall 3/3 precautions. OT provided education on home set-up to maximize safety and independence with self-care tasks and acquisition/use of AE/DME including use of RW upon return home. Patient expressed verbal understanding but would benefit from continued education. Given PLOF and current deficits, recommendation for HHOT and initial 24hr supervision/assist.    Follow Up Recommendations  Home health OT;Supervision/Assistance - 24 hour (Initial 24hr supervision/assist)    Equipment Recommendations  3 in 1 bedside commode    Recommendations for Other Services       Precautions / Restrictions Precautions Precautions: Fall;Back Precaution Booklet Issued: Yes (comment) Precaution Comments: Written and verbal education provided to patient/family; son able to recall 3/3 back precautions. Required Braces or Orthoses:  (No brace required) Restrictions Weight Bearing Restrictions: No      Mobility Bed Mobility               General bed mobility comments: Patient seated EOB upon entry. Education provided on log rolling technique.  Patient/family expressed verbal understanding.    Transfers Overall transfer level: Needs assistance Equipment used:  (Hurrycane) Transfers: Sit to/from Stand Sit to Stand: Min guard         General transfer comment: Min guard for sit to stand x2 trials for steadying/safety. Requires increased time/effort to come fully upright.    Balance Overall balance assessment: Needs assistance Sitting-balance support: Feet supported Sitting balance-Leahy Scale: Fair     Standing balance support: Single extremity supported;Bilateral upper extremity supported;During functional activity Standing balance-Leahy Scale: Poor Standing balance comment: Reliant on unilateral vs BUE support for dynamic balance.                           ADL either performed or assessed with clinical judgement   ADL Overall ADL's : Needs assistance/impaired     Grooming: Supervision/safety;Standing Grooming Details (indicate cue type and reason): Supervision A for safety.         Upper Body Dressing : Set up;Sitting Upper Body Dressing Details (indicate cue type and reason): Donned robe from home seated EOB with set-up assist. Lower Body Dressing: Minimal assistance;Sit to/from stand Lower Body Dressing Details (indicate cue type and reason): Min A to don footwear. Able to don underwear in figure-4 position with increased time/effort. Toilet Transfer: Copy Details (indicate cue type and reason): Supervision A to void bladder in standing with use of rail. Toileting- Water quality scientist and Hygiene: Min guard Toileting - Clothing Manipulation Details (indicate cue type and reason): Min guard for hygiene/clothing mangement in standing.     Functional mobility during ADLs: Min guard;Cane General ADL Comments: Patient limited by pain in low back and balance deficits.  Would benefit from use of RW.     Vision Baseline Vision/History: Wears glasses Wears Glasses:  Reading only Patient Visual Report: No change from baseline       Perception     Praxis      Pertinent Vitals/Pain Pain Assessment: 0-10 Pain Score: 6  Pain Location: Low back (incisional) Pain Descriptors / Indicators: Aching;Sore Pain Intervention(s): Limited activity within patient's tolerance;Monitored during session;Repositioned;Premedicated before session     Hand Dominance     Extremity/Trunk Assessment Upper Extremity Assessment Upper Extremity Assessment: Overall WFL for tasks assessed   Lower Extremity Assessment Lower Extremity Assessment: Defer to PT evaluation   Cervical / Trunk Assessment Cervical / Trunk Assessment: Other exceptions Cervical / Trunk Exceptions: s/p lumbar surgery   Communication Communication Communication: HOH   Cognition Arousal/Alertness: Awake/alert Behavior During Therapy: WFL for tasks assessed/performed Overall Cognitive Status: Within Functional Limits for tasks assessed                                 General Comments: A&Ox4; requires occasional cues for adherence to back precautions.   General Comments  Son present at bedside throughout evaluation.    Exercises     Shoulder Instructions      Home Living Family/patient expects to be discharged to:: Private residence Living Arrangements: Alone Available Help at Discharge: Family;Available 24 hours/day (Son to stay or next several days; family able to provide close to 24hr supervision/assist therafter.) Type of Home: House Home Access: Stairs to enter [employer] of Steps: 1 +1 Entrance Stairs-Rails: None Home Layout: Two level;Laundry or work area in basement (Bedroom/bathroom on main level; does not access basement.)     Bathroom Shower/Tub: Occupational psychologist: Standard     Home Equipment: Environmental consultant - 2 wheels;Other (comment);Shower seat - built in [facility])   Additional Comments: Son reports walker is in good condition       Prior Functioning/Environment Level of Independence: Independent with assistive device(s)        Comments: Mod I with ADLs/IADLs with use of hurrycane; son stays overnight some days.        OT Problem List: Decreased strength;Decreased activity tolerance;Impaired balance (sitting and/or standing);Decreased safety awareness;Decreased knowledge of use of DME or AE      OT Treatment/Interventions: Self-care/ADL training;Therapeutic exercise;Energy conservation;DME and/or AE instruction;Therapeutic activities;Patient/family education;Balance training    OT Goals(Current goals can be found in the care plan section) Acute Rehab OT Goals Patient Stated Goal: To return home. OT Goal Formulation: With patient/family Time For Goal Achievement: 11/10/20 Potential to Achieve Goals: Good ADL Goals Additional ADL Goal #1: Patient will complete ADLs with Mod I and use of AE/DME in prep for safe d/c home. Additional ADL Goal #2: Patient will recall and demo 3/3 back precautions during ADLs with good adherence.  OT Frequency: Min 2X/week   Barriers to D/C:            Co-evaluation              AM-PAC OT "6 Clicks" Daily Activity     Outcome Measure Help from another person eating meals?: None Help from another person taking care of personal grooming?: A Little Help from another person toileting, which includes using toliet, bedpan, or urinal?: A Little Help from another person bathing (including washing, rinsing, drying)?: A Little Help from another person to put on and taking off regular upper body clothing?: A Little  Help from another person to put on and taking off regular lower body clothing?: A Little 6 Click Score: 19   End of Session Equipment Utilized During Treatment: Gait belt;Other (comment) Loleta Chance) Nurse Communication: Mobility status;Other (comment) (Response to treatment)  Activity Tolerance: Patient tolerated treatment well;No increased pain Patient left: in  chair;with call bell/phone within reach;with family/visitor present  OT Visit Diagnosis: Unsteadiness on feet (R26.81);Other abnormalities of gait and mobility (R26.89);Muscle weakness (generalized) (M62.81);Pain Pain - part of body:  (low back)                Time: 4360-6770 OT Time Calculation (min): 23 min Charges:  OT General Charges $OT Visit: 1 Visit OT Evaluation $OT Eval Low Complexity: 1 Low OT Treatments $Self Care/Home Management : 8-22 mins  Zavia Pullen H. OTR/L Supplemental OT, Department of rehab services [phone]  Mandee Pluta R H. 10/27/2020, 9:16 AM

## 2020-10-27 NOTE — Plan of Care (Signed)
  Problem: Education: Goal: Ability to verbalize activity precautions or restrictions will improve Outcome: Progressing   Problem: Activity: Goal: Ability to avoid complications of mobility impairment will improve Outcome: Progressing   

## 2020-10-27 NOTE — Discharge Summary (Signed)
Physician Discharge Summary  Patient ID: Eric Brady MRN: [id number] DOB/AGE: 1933/03/10 87 y.o. Estimated body mass index is 25.09 kg/m as calculated from the following:   Height as of this encounter: 5\' 11"  (1.803 m).   Weight as of this encounter: 81.6 kg.   Admit date: 10/26/2020 Discharge date: 10/27/2020  Admission Diagnoses: Herniated nucleus pulposus L3-4 right  Discharge Diagnoses: Same Active Problems:   HNP (herniated nucleus pulposus), lumbar   Discharged Condition: good  Hospital Course: Patient was admitted to hospital underwent lumbar laminectomy microdiscectomy and did very well in the [place] covering the floor on the floor was ambulating and voiding spontaneously tolerating regular diet stable for discharge home.  Consults: Significant Diagnostic Studies: Treatments: Lumbar limited to microdiscectomy L3-4 on the right Discharge Exam: Blood pressure (!) 154/61, pulse (!) 55, temperature 98.4 F (36.9 C), temperature source Oral, resp. rate 18, height 5\' 11"  (1.803 m), weight 81.6 kg, SpO2 97 %. Strength out of 5 wound clean dry and intact  Disposition: Home   Allergies as of 10/27/2020   No Known Allergies      Medication List     TAKE these medications    aspirin EC 81 MG tablet Take 81 mg by mouth daily. Swallow whole.   atorvastatin 10 MG tablet Commonly known as: LIPITOR Take 10 mg by mouth every evening.   calcium-vitamin D 500-200 MG-UNIT tablet Commonly known as: OSCAL WITH D Take 2 tablets by mouth daily.   cholecalciferol 25 MCG (1000 UNIT) tablet Commonly known as: VITAMIN D3 Take 1,000 Units by mouth daily.   cyanocobalamin 1000 MCG/ML injection Commonly known as: (VITAMIN B-12) Inject 1,000 mcg into the muscle every 30 (thirty) days.   cyclobenzaprine 10 MG tablet Commonly known as: FLEXERIL Take 1 tablet (10 mg total) by mouth 3 (three) times daily as needed for muscle spasms.   EPINEPHrine 0.3 mg/0.3 mL Soaj  injection Commonly known as: EpiPen 2-Pak Inject 0.3 mg into the muscle once as needed for anaphylaxis.   fludrocortisone 0.1 MG tablet Commonly known as: FLORINEF Take 2 tablets (0.2 mg total) by mouth daily.   HYDROcodone-acetaminophen 5-325 mg Tabs tablet Commonly known as: VICODIN Take one tablet by mouth every six hours as needed for pain What changed:  how much to take how to take this when to take this   metFORMIN 500 MG 24 hr tablet Commonly known as: GLUCOPHAGE-XR Take 1,000 mg by mouth every evening.   midodrine 10 MG tablet Commonly known as: PROAMATINE Take 10 mg by mouth 3 (three) times daily.   OneTouch Verio test strip Generic drug: glucose blood 1 each daily.   Oxycodone HCl 10 MG Tabs Take 1 tablet (10 mg total) by mouth every 6 (six) hours as needed for severe pain ((score 7 to 10)).   pioglitazone 15 MG tablet Commonly known as: ACTOS Take 15 mg by mouth every evening.       ASK your doctor about these medications    traMADol 50 MG tablet Commonly known as: ULTRAM Take 1 tablet (50 mg total) by mouth every 6 (six) hours as needed.         Signed: Elaina Hoops 10/27/2020, 7:50 AM

## 2020-10-27 NOTE — TOC Transition Note (Signed)
Transition of Care [facility]) - CM/SW Discharge Note   Patient Details  Name: Eric Brady MRN: [id number] Date of Birth: 10-22-1932  Transition of [facility]) CM/SW Contact:  Angelita Ingles, RN Phone [phone]  10/27/2020, 11:20 AM   Clinical Narrative:    [facility] consulted for patient discharging home with [facility] needs. CM at bedside to offer choice to patient and son. HH has been set up with [facility]. Info has been added to avs. No other needs noted at this time. TOC will sign off.    Final next level of care: [place] Barriers to Discharge: No Barriers Identified   Patient Goals and CMS Choice Patient states their goals for this hospitalization and ongoing recovery are:: Ready to go home CMS Medicare.gov Compare Post Acute Care list provided to:: Patient Represenative (must comment) Choice offered to / list presented to : Patient, Adult Children  Discharge Placement                       Discharge Plan and Services                DME Arranged: N/A         HH Arranged: PT, OT HH Agency: [facility] (now [facility]) Date [place]: 10/27/20 Time [place]: 1119 Representative spoke with at [place]: [place] ([place]) Interventions     Readmission Risk Interventions No flowsheet data found.

## 2020-10-27 NOTE — Evaluation (Signed)
Physical Therapy Evaluation Patient Details Name: Eric Brady MRN: [id number] DOB: 1932-12-14 Today's Date: 10/27/2020   History of Present Illness  87 y/o male presenting 7/25 for elective lumbar microdiscetomy and laminectomy at L3-4 secondary to severe spinal stenosis with herniated nucleus pulposus. PMHx significant for prostate cancer s/p radiation, skin cancer, DM and HLD.   Clinical Impression  Pt admitted with above diagnosis. At the time of PT eval, pt was able to demonstrate transfers and ambulation with gross min guard assist and up to RW for support. Pt started out with [facility] but fatigued quickly. RW was recommended to optimize balance and energy conservation. Pt was educated on precautions, appropriate activity progression, and car transfer. Pt currently with functional limitations due to the deficits listed below (see PT Problem List). Pt will benefit from skilled PT to increase their independence and safety with mobility to allow discharge to the venue listed below.      Follow Up Recommendations Home health PT;Supervision for mobility/OOB    Equipment Recommendations  None recommended by PT    Recommendations for Other Services       Precautions / Restrictions Precautions Precautions: Fall;Back Precaution Booklet Issued: Yes (comment) Precaution Comments: Reviewed handout and pt was cued for precautions during functional mobility. Required Braces or Orthoses:  (No brace required) Restrictions Weight Bearing Restrictions: No      Mobility  Bed Mobility Overal bed mobility: Needs Assistance Bed Mobility: Rolling;Sidelying to Sit;Sit to Sidelying Rolling: Supervision Sidelying to sit: Min guard     Sit to sidelying: Min assist General bed mobility comments: VC's for optimal log roll technique. Practiced full log roll x2. Required min assist for LE elevation to full sitting position.    Transfers Overall transfer level: Needs assistance Equipment used:  Rolling walker (2 wheeled);Straight cane (Hurrycane) Transfers: Sit to/from Stand Sit to Stand: Min guard         General transfer comment: No assist required but close guard provided for safety. VC's for hand placement on seated surface for safety when preparing for stand>sit.  Ambulation/Gait Ambulation/Gait assistance: Min guard Gait Distance (Feet): 200 Feet Assistive device: Rolling walker (2 wheeled);Straight cane Gait Pattern/deviations: Step-through pattern;Decreased stride length;Trunk flexed;Narrow base of support Gait velocity: Decreased Gait velocity interpretation: <1.31 ft/sec, indicative of household ambulator General Gait Details: VC's for improved posture, closer walker proximity, and forward gaze. Pt fatigued quickly and required transition from cane to walker for increased stability and energy conservation. Pt reporting lightheadedness and vitals taken when returned to the room. See vitals flowsheet for details, however VSS with BP at 114/63.  Stairs            Wheelchair Mobility    Modified Rankin (Stroke Patients Only)       Balance Overall balance assessment: Needs assistance Sitting-balance support: Feet supported Sitting balance-Leahy Scale: Fair     Standing balance support: Single extremity supported;Bilateral upper extremity supported;During functional activity Standing balance-Leahy Scale: Poor Standing balance comment: Reliant on unilateral vs BUE support for dynamic balance.                             Pertinent Vitals/Pain Pain Assessment: Faces Faces Pain Scale: Hurts little more Pain Location: Low back (incisional) Pain Descriptors / Indicators: Aching;Sore;Operative site guarding Pain Intervention(s): Limited activity within patient's tolerance;Monitored during session;Repositioned    Home Living Family/patient expects to be discharged to:: Private residence Living Arrangements: Alone Available Help at Discharge:  Family;Available 24  hours/day (Son to stay for next several days; family able to provide close to 24hr supervision/assist therafter.) Type of Home: House Home Access: Stairs to enter Entrance Stairs-Rails: None Entrance Stairs-Number of Steps: 1 +1 Home Layout: Two level;Laundry or work area in basement (Bedroom/bathroom on main level; does not access basement.) Home Equipment: Environmental consultant - 2 wheels;Other (comment);Shower seat - built in [facility]) Additional Comments: Son reports walker is in good condition    Prior Function Level of Independence: Independent with assistive device(s)         Comments: Mod I with ADLs/IADLs with use of hurrycane; son stays overnight some days. Pt's son reports pt was using the cane occasionally up until the last couple weeks when he needed the walker due to pain.     Hand Dominance        Extremity/Trunk Assessment   Upper Extremity Assessment Upper Extremity Assessment: Defer to OT evaluation    Lower Extremity Assessment Lower Extremity Assessment: Generalized weakness (Consistent with pre-op diagnosis. Decreased muscular endurance in LE's.)    Cervical / Trunk Assessment Cervical / Trunk Assessment: Other exceptions Cervical / Trunk Exceptions: s/p lumbar surgery  Communication   Communication: HOH  Cognition Arousal/Alertness: Awake/alert Behavior During Therapy: WFL for tasks assessed/performed Overall Cognitive Status: Within Functional Limits for tasks assessed                                        General Comments General comments (skin integrity, edema, etc.): Son present and supportive throughout session.    Exercises     Assessment/Plan    PT Assessment Patient needs continued PT services  PT Problem List Decreased strength;Decreased activity tolerance;Decreased balance;Decreased mobility;Decreased knowledge of use of DME;Decreased safety awareness;Decreased knowledge of precautions;Pain       PT  Treatment Interventions DME instruction;Gait training;Stair training;Functional mobility training;Therapeutic activities;Therapeutic exercise;Neuromuscular re-education;Patient/family education    PT Goals (Current goals can be found in the Care Plan section)  Acute Rehab PT Goals Patient Stated Goal: To return home. PT Goal Formulation: With patient/family Time For Goal Achievement: 11/03/20 Potential to Achieve Goals: Good    Frequency Min 5X/week   Barriers to discharge        Co-evaluation               AM-PAC PT "6 Clicks" Mobility  Outcome Measure Help needed turning from your back to your side while in a flat bed without using bedrails?: None Help needed moving from lying on your back to sitting on the side of a flat bed without using bedrails?: A Little Help needed moving to and from a bed to a chair (including a wheelchair)?: A Little Help needed standing up from a chair using your arms (e.g., wheelchair or bedside chair)?: A Little Help needed to walk in hospital room?: A Little Help needed climbing 3-5 steps with a railing? : A Little 6 Click Score: 19    End of Session Equipment Utilized During Treatment: Gait belt Activity Tolerance: Patient limited by fatigue Patient left: in bed;with call bell/phone within reach;with family/visitor present Nurse Communication: Mobility status PT Visit Diagnosis: Unsteadiness on feet (R26.81)    Time: 5035-4656 PT Time Calculation (min) (ACUTE ONLY): 25 min   Charges:   PT Evaluation $PT Eval Low Complexity: 1 Low PT Treatments $Gait Training: 8-22 mins        Rolinda Roan, PT, DPT Acute Rehabilitation Services Pager:  [phone] Office: [place] 10/27/2020, 1:30 PM

## 2020-10-27 NOTE — Discharge Instructions (Signed)
Wound Care  Keep the incision clean and dry remove the outer dressing in 2-3 days, leave the Steri-Strips intact. Wrap with Saran wrap for showers only Do not put any creams, lotions, or ointments on incision. Leave steri-strips on back.  They will fall off by themselves.  Activity Walk each and every day, increasing distance each day. No lifting greater than 5 lbs.  No lifting no bending no twisting no driving or riding a car unless coming back and forth to see me. If provided with back brace, wear when out of bed.  It is not necessary to wear brace in bed. Diet Resume your normal diet.   Return to Work Will be discussed at you follow up appointment.  Call Your Doctor If Any of These Occur Redness, drainage, or swelling at the wound.  Temperature greater than 101 degrees. Severe pain not relieved by pain medication. Incision starts to come apart. Follow Up Appt Call today for appointment in 1-2 weeks ([phone]) or for problems.  If you have any hardware placed in your spine, you will need an x-ray before your appointment.   

## 2020-10-27 NOTE — Progress Notes (Signed)
Patient was transported via wheelchair by volunteer for discharge home; in no acute distress nor complaints of pain nor discomfort; room was checked and accounted for all his belongings; discharge instructions given to patient's son by RN and he verbalized understanding on the instructions given.

## 2020-10-30 DIAGNOSIS — M5126 Other intervertebral disc displacement, lumbar region: Secondary | ICD-10-CM | POA: Diagnosis not present

## 2020-10-30 DIAGNOSIS — Z4789 Encounter for other orthopedic aftercare: Secondary | ICD-10-CM | POA: Diagnosis not present

## 2020-10-30 DIAGNOSIS — E785 Hyperlipidemia, unspecified: Secondary | ICD-10-CM | POA: Diagnosis not present

## 2020-10-30 DIAGNOSIS — M48061 Spinal stenosis, lumbar region without neurogenic claudication: Secondary | ICD-10-CM | POA: Diagnosis not present

## 2020-10-30 DIAGNOSIS — Z85828 Personal history of other malignant neoplasm of skin: Secondary | ICD-10-CM | POA: Diagnosis not present

## 2020-10-30 DIAGNOSIS — E119 Type 2 diabetes mellitus without complications: Secondary | ICD-10-CM | POA: Diagnosis not present

## 2020-10-30 DIAGNOSIS — Z8546 Personal history of malignant neoplasm of prostate: Secondary | ICD-10-CM | POA: Diagnosis not present

## 2020-10-30 DIAGNOSIS — Z7982 Long term (current) use of aspirin: Secondary | ICD-10-CM | POA: Diagnosis not present

## 2020-10-30 DIAGNOSIS — J45909 Unspecified asthma, uncomplicated: Secondary | ICD-10-CM | POA: Diagnosis not present

## 2020-10-30 DIAGNOSIS — Z7984 Long term (current) use of oral hypoglycemic drugs: Secondary | ICD-10-CM | POA: Diagnosis not present

## 2020-10-30 DIAGNOSIS — Z87891 Personal history of nicotine dependence: Secondary | ICD-10-CM | POA: Diagnosis not present

## 2020-10-30 DIAGNOSIS — D649 Anemia, unspecified: Secondary | ICD-10-CM | POA: Diagnosis not present

## 2020-11-16 DIAGNOSIS — D519 Vitamin B12 deficiency anemia, unspecified: Secondary | ICD-10-CM | POA: Diagnosis not present

## 2020-11-29 DIAGNOSIS — Z4789 Encounter for other orthopedic aftercare: Secondary | ICD-10-CM | POA: Diagnosis not present

## 2020-11-29 DIAGNOSIS — Z7984 Long term (current) use of oral hypoglycemic drugs: Secondary | ICD-10-CM | POA: Diagnosis not present

## 2020-11-29 DIAGNOSIS — M48061 Spinal stenosis, lumbar region without neurogenic claudication: Secondary | ICD-10-CM | POA: Diagnosis not present

## 2020-11-29 DIAGNOSIS — Z8546 Personal history of malignant neoplasm of prostate: Secondary | ICD-10-CM | POA: Diagnosis not present

## 2020-11-29 DIAGNOSIS — E785 Hyperlipidemia, unspecified: Secondary | ICD-10-CM | POA: Diagnosis not present

## 2020-11-29 DIAGNOSIS — J45909 Unspecified asthma, uncomplicated: Secondary | ICD-10-CM | POA: Diagnosis not present

## 2020-11-29 DIAGNOSIS — Z7982 Long term (current) use of aspirin: Secondary | ICD-10-CM | POA: Diagnosis not present

## 2020-11-29 DIAGNOSIS — M5126 Other intervertebral disc displacement, lumbar region: Secondary | ICD-10-CM | POA: Diagnosis not present

## 2020-11-29 DIAGNOSIS — Z85828 Personal history of other malignant neoplasm of skin: Secondary | ICD-10-CM | POA: Diagnosis not present

## 2020-11-29 DIAGNOSIS — Z87891 Personal history of nicotine dependence: Secondary | ICD-10-CM | POA: Diagnosis not present

## 2020-11-29 DIAGNOSIS — D649 Anemia, unspecified: Secondary | ICD-10-CM | POA: Diagnosis not present

## 2020-11-29 DIAGNOSIS — E119 Type 2 diabetes mellitus without complications: Secondary | ICD-10-CM | POA: Diagnosis not present

## 2020-12-17 DIAGNOSIS — E1129 Type 2 diabetes mellitus with other diabetic kidney complication: Secondary | ICD-10-CM | POA: Diagnosis not present

## 2020-12-17 DIAGNOSIS — Z79899 Other long term (current) drug therapy: Secondary | ICD-10-CM | POA: Diagnosis not present

## 2020-12-17 DIAGNOSIS — N1832 Chronic kidney disease, stage 3b: Secondary | ICD-10-CM | POA: Diagnosis not present

## 2020-12-17 DIAGNOSIS — D550 Anemia due to glucose-6-phosphate dehydrogenase [G6PD] deficiency: Secondary | ICD-10-CM | POA: Diagnosis not present

## 2020-12-24 DIAGNOSIS — N1832 Chronic kidney disease, stage 3b: Secondary | ICD-10-CM | POA: Diagnosis not present

## 2020-12-24 DIAGNOSIS — D649 Anemia, unspecified: Secondary | ICD-10-CM | POA: Diagnosis not present

## 2020-12-24 DIAGNOSIS — R7309 Other abnormal glucose: Secondary | ICD-10-CM | POA: Diagnosis not present

## 2020-12-24 DIAGNOSIS — E1122 Type 2 diabetes mellitus with diabetic chronic kidney disease: Secondary | ICD-10-CM | POA: Diagnosis not present

## 2020-12-29 DIAGNOSIS — D519 Vitamin B12 deficiency anemia, unspecified: Secondary | ICD-10-CM | POA: Diagnosis not present

## 2021-01-28 DIAGNOSIS — C61 Malignant neoplasm of prostate: Secondary | ICD-10-CM | POA: Diagnosis not present

## 2021-01-28 DIAGNOSIS — Z8042 Family history of malignant neoplasm of prostate: Secondary | ICD-10-CM | POA: Diagnosis not present

## 2021-02-09 DIAGNOSIS — D519 Vitamin B12 deficiency anemia, unspecified: Secondary | ICD-10-CM | POA: Diagnosis not present

## 2021-03-15 DIAGNOSIS — D519 Vitamin B12 deficiency anemia, unspecified: Secondary | ICD-10-CM | POA: Diagnosis not present

## 2021-03-18 DIAGNOSIS — E1129 Type 2 diabetes mellitus with other diabetic kidney complication: Secondary | ICD-10-CM | POA: Diagnosis not present

## 2021-03-18 DIAGNOSIS — Z79899 Other long term (current) drug therapy: Secondary | ICD-10-CM | POA: Diagnosis not present

## 2021-03-18 DIAGNOSIS — N1832 Chronic kidney disease, stage 3b: Secondary | ICD-10-CM | POA: Diagnosis not present

## 2021-03-18 DIAGNOSIS — D630 Anemia in neoplastic disease: Secondary | ICD-10-CM | POA: Diagnosis not present

## 2021-03-25 DIAGNOSIS — E1122 Type 2 diabetes mellitus with diabetic chronic kidney disease: Secondary | ICD-10-CM | POA: Diagnosis not present

## 2021-03-25 DIAGNOSIS — R799 Abnormal finding of blood chemistry, unspecified: Secondary | ICD-10-CM | POA: Diagnosis not present

## 2021-03-25 DIAGNOSIS — D550 Anemia due to glucose-6-phosphate dehydrogenase [G6PD] deficiency: Secondary | ICD-10-CM | POA: Diagnosis not present

## 2021-03-25 DIAGNOSIS — N1832 Chronic kidney disease, stage 3b: Secondary | ICD-10-CM | POA: Diagnosis not present

## 2021-03-26 ENCOUNTER — Other Ambulatory Visit: Payer: Self-pay | Admitting: Internal Medicine

## 2021-03-26 ENCOUNTER — Other Ambulatory Visit (HOSPITAL_COMMUNITY): Payer: Self-pay | Admitting: Internal Medicine

## 2021-03-26 DIAGNOSIS — R7989 Other specified abnormal findings of blood chemistry: Secondary | ICD-10-CM

## 2021-04-01 ENCOUNTER — Ambulatory Visit (HOSPITAL_COMMUNITY)
Admission: RE | Admit: 2021-04-01 | Discharge: 2021-04-01 | Disposition: A | Discharge status: Home / Self Care | Payer: Medicare Other | Source: Ambulatory Visit | Attending: Internal Medicine | Admitting: Internal Medicine

## 2021-04-01 ENCOUNTER — Other Ambulatory Visit: Payer: Self-pay

## 2021-04-01 DIAGNOSIS — R7989 Other specified abnormal findings of blood chemistry: Secondary | ICD-10-CM | POA: Insufficient documentation

## 2021-04-01 DIAGNOSIS — N1330 Unspecified hydronephrosis: Secondary | ICD-10-CM | POA: Diagnosis not present

## 2021-04-01 DIAGNOSIS — N134 Hydroureter: Secondary | ICD-10-CM | POA: Diagnosis not present

## 2021-04-19 DIAGNOSIS — D519 Vitamin B12 deficiency anemia, unspecified: Secondary | ICD-10-CM | POA: Diagnosis not present

## 2021-04-19 DIAGNOSIS — N1831 Chronic kidney disease, stage 3a: Secondary | ICD-10-CM | POA: Diagnosis not present

## 2021-04-19 DIAGNOSIS — D550 Anemia due to glucose-6-phosphate dehydrogenase [G6PD] deficiency: Secondary | ICD-10-CM | POA: Diagnosis not present

## 2021-04-19 DIAGNOSIS — E1129 Type 2 diabetes mellitus with other diabetic kidney complication: Secondary | ICD-10-CM | POA: Diagnosis not present

## 2021-04-19 DIAGNOSIS — Z79899 Other long term (current) drug therapy: Secondary | ICD-10-CM | POA: Diagnosis not present

## 2021-04-20 DIAGNOSIS — N390 Urinary tract infection, site not specified: Secondary | ICD-10-CM | POA: Diagnosis not present

## 2021-05-05 ENCOUNTER — Other Ambulatory Visit: Payer: Self-pay

## 2021-05-05 ENCOUNTER — Ambulatory Visit: Payer: Medicare Other | Admitting: Dermatology

## 2021-05-05 DIAGNOSIS — Z1283 Encounter for screening for malignant neoplasm of skin: Secondary | ICD-10-CM | POA: Diagnosis not present

## 2021-05-05 DIAGNOSIS — L219 Seborrheic dermatitis, unspecified: Secondary | ICD-10-CM

## 2021-05-05 DIAGNOSIS — D485 Neoplasm of uncertain behavior of skin: Secondary | ICD-10-CM

## 2021-05-05 DIAGNOSIS — L821 Other seborrheic keratosis: Secondary | ICD-10-CM

## 2021-05-05 DIAGNOSIS — L570 Actinic keratosis: Secondary | ICD-10-CM | POA: Diagnosis not present

## 2021-05-05 MED ORDER — CLOBETASOL PROPIONATE 0.05 % EX FOAM
Freq: Two times a day (BID) | CUTANEOUS | 2 refills | Status: DC
Start: 2021-05-05 — End: 2021-05-05

## 2021-05-05 MED ORDER — CLOBETASOL PROPIONATE 0.05 % EX FOAM: Freq: Two times a day (BID) | CUTANEOUS | 2 refills | Status: DC

## 2021-05-05 NOTE — Patient Instructions (Signed)

## 2021-05-24 DIAGNOSIS — D519 Vitamin B12 deficiency anemia, unspecified: Secondary | ICD-10-CM | POA: Diagnosis not present

## 2021-05-29 ENCOUNTER — Encounter: Payer: Self-pay | Admitting: Dermatology

## 2021-05-29 NOTE — Progress Notes (Signed)
° °  Follow-Up Visit   Subjective  Eric Brady is a 88 y.o. male who presents for the following: Annual Exam (Pt here for annual. Pt has a few spots of concern to be evaluated ).  General skin check, growth of spot below right eye, itchy rash on scalp Location:  Duration:  Quality:  Associated Signs/Symptoms: Modifying Factors:  Severity:  Timing: Context:   Objective  Well appearing patient in no apparent distress; mood and affect are within normal limits. Waist up skin exam.  No atypical pigmented lesions.  1 possible nonmelanoma skin cancer below right eye will be biopsied.  Patient has multiple brown textured lesions.  Face and torso.  Right Malar Cheek 16mm verrucous hornlike growth       Scalp Itchy pink scaly    All skin waist up examined.   Assessment & Plan    Screening for malignant neoplasm of skin  Yearly skin exams.  Neoplasm of uncertain behavior of skin Right Malar Cheek  Skin / nail biopsy Type of biopsy: tangential   Informed consent: discussed and consent obtained   Timeout: patient name, date of birth, surgical site, and procedure verified   Anesthesia: the lesion was anesthetized in a standard fashion   Anesthetic:  1% lidocaine w/ epinephrine 1-100,000 local infiltration Instrument used: flexible razor blade   Hemostasis achieved with: ferric subsulfate   Outcome: patient tolerated procedure well   Post-procedure details: wound care instructions given    Specimen 1 - Surgical pathology Differential Diagnosis: scc vs bcc  Check Margins: No  Seborrheic dermatitis Scalp  Clobetasol foam daily for 2 to 3 weeks and then as needed if improves.  Do not use on face  Related Medications clobetasol (OLUX) 0.05 % topical foam Apply topically 2 (two) times daily. APPLY TO THE SCALP TWICE DAILY FOR 1 MONTH  Seborrheic keratoses  Leave if stable.      I, [name], MD, have reviewed all documentation for this visit.  The  documentation on 05/29/21 for the exam, diagnosis, procedures, and orders are all accurate and complete.

## 2021-06-07 DIAGNOSIS — N2581 Secondary hyperparathyroidism of renal origin: Secondary | ICD-10-CM | POA: Diagnosis not present

## 2021-06-07 DIAGNOSIS — I951 Orthostatic hypotension: Secondary | ICD-10-CM | POA: Diagnosis not present

## 2021-06-07 DIAGNOSIS — N189 Chronic kidney disease, unspecified: Secondary | ICD-10-CM | POA: Diagnosis not present

## 2021-06-07 DIAGNOSIS — N184 Chronic kidney disease, stage 4 (severe): Secondary | ICD-10-CM | POA: Diagnosis not present

## 2021-06-07 DIAGNOSIS — D631 Anemia in chronic kidney disease: Secondary | ICD-10-CM | POA: Diagnosis not present

## 2021-06-08 DIAGNOSIS — Z79899 Other long term (current) drug therapy: Secondary | ICD-10-CM | POA: Diagnosis not present

## 2021-06-08 DIAGNOSIS — E1129 Type 2 diabetes mellitus with other diabetic kidney complication: Secondary | ICD-10-CM | POA: Diagnosis not present

## 2021-06-08 DIAGNOSIS — N184 Chronic kidney disease, stage 4 (severe): Secondary | ICD-10-CM | POA: Diagnosis not present

## 2021-06-15 DIAGNOSIS — N1832 Chronic kidney disease, stage 3b: Secondary | ICD-10-CM | POA: Diagnosis not present

## 2021-06-15 DIAGNOSIS — E1122 Type 2 diabetes mellitus with diabetic chronic kidney disease: Secondary | ICD-10-CM | POA: Diagnosis not present

## 2021-06-16 ENCOUNTER — Other Ambulatory Visit: Payer: Self-pay | Admitting: Nephrology

## 2021-06-16 DIAGNOSIS — N184 Chronic kidney disease, stage 4 (severe): Secondary | ICD-10-CM

## 2021-06-21 DIAGNOSIS — D631 Anemia in chronic kidney disease: Secondary | ICD-10-CM | POA: Diagnosis not present

## 2021-06-21 DIAGNOSIS — I951 Orthostatic hypotension: Secondary | ICD-10-CM | POA: Diagnosis not present

## 2021-06-21 DIAGNOSIS — N2581 Secondary hyperparathyroidism of renal origin: Secondary | ICD-10-CM | POA: Diagnosis not present

## 2021-06-21 DIAGNOSIS — N184 Chronic kidney disease, stage 4 (severe): Secondary | ICD-10-CM | POA: Diagnosis not present

## 2021-06-21 DIAGNOSIS — N189 Chronic kidney disease, unspecified: Secondary | ICD-10-CM | POA: Diagnosis not present

## 2021-06-23 ENCOUNTER — Ambulatory Visit
Admission: RE | Admit: 2021-06-23 | Discharge: 2021-06-23 | Disposition: A | Discharge status: Home / Self Care | Payer: Medicare Other | Source: Ambulatory Visit | Attending: Nephrology | Admitting: Nephrology

## 2021-06-23 DIAGNOSIS — N184 Chronic kidney disease, stage 4 (severe): Secondary | ICD-10-CM | POA: Diagnosis not present

## 2021-06-23 DIAGNOSIS — N281 Cyst of kidney, acquired: Secondary | ICD-10-CM | POA: Diagnosis not present

## 2021-06-23 DIAGNOSIS — N323 Diverticulum of bladder: Secondary | ICD-10-CM | POA: Diagnosis not present

## 2021-06-23 DIAGNOSIS — N1330 Unspecified hydronephrosis: Secondary | ICD-10-CM | POA: Diagnosis not present

## 2021-06-25 DIAGNOSIS — D519 Vitamin B12 deficiency anemia, unspecified: Secondary | ICD-10-CM | POA: Diagnosis not present

## 2021-06-29 ENCOUNTER — Other Ambulatory Visit (HOSPITAL_COMMUNITY): Payer: Self-pay | Admitting: Nephrology

## 2021-06-29 ENCOUNTER — Other Ambulatory Visit: Payer: Self-pay | Admitting: Nephrology

## 2021-06-29 DIAGNOSIS — N184 Chronic kidney disease, stage 4 (severe): Secondary | ICD-10-CM

## 2021-06-30 ENCOUNTER — Other Ambulatory Visit: Payer: Self-pay | Admitting: Radiology

## 2021-07-01 ENCOUNTER — Ambulatory Visit (HOSPITAL_COMMUNITY): Admission: RE | Admit: 2021-07-01 | Payer: Medicare Other | Source: Ambulatory Visit

## 2021-07-06 DIAGNOSIS — N184 Chronic kidney disease, stage 4 (severe): Secondary | ICD-10-CM | POA: Diagnosis not present

## 2021-07-06 DIAGNOSIS — N2581 Secondary hyperparathyroidism of renal origin: Secondary | ICD-10-CM | POA: Diagnosis not present

## 2021-07-06 DIAGNOSIS — I951 Orthostatic hypotension: Secondary | ICD-10-CM | POA: Diagnosis not present

## 2021-07-06 DIAGNOSIS — D631 Anemia in chronic kidney disease: Secondary | ICD-10-CM | POA: Diagnosis not present

## 2021-07-06 DIAGNOSIS — N189 Chronic kidney disease, unspecified: Secondary | ICD-10-CM | POA: Diagnosis not present

## 2021-07-08 ENCOUNTER — Other Ambulatory Visit: Payer: Self-pay | Admitting: Radiology

## 2021-07-08 ENCOUNTER — Other Ambulatory Visit: Payer: Self-pay | Admitting: Internal Medicine

## 2021-07-09 ENCOUNTER — Ambulatory Visit (HOSPITAL_COMMUNITY)
Admission: RE | Admit: 2021-07-09 | Discharge: 2021-07-09 | Disposition: A | Discharge status: Home / Self Care | Payer: Medicare Other | Source: Ambulatory Visit | Attending: Nephrology | Admitting: Nephrology

## 2021-07-09 ENCOUNTER — Other Ambulatory Visit (HOSPITAL_COMMUNITY): Payer: Self-pay | Admitting: Nephrology

## 2021-07-09 ENCOUNTER — Encounter (HOSPITAL_COMMUNITY): Payer: Self-pay

## 2021-07-09 DIAGNOSIS — N184 Chronic kidney disease, stage 4 (severe): Secondary | ICD-10-CM | POA: Insufficient documentation

## 2021-07-09 DIAGNOSIS — E1122 Type 2 diabetes mellitus with diabetic chronic kidney disease: Secondary | ICD-10-CM | POA: Diagnosis not present

## 2021-07-09 LAB — CBC
HCT: 30.3 % — ABNORMAL LOW (ref 39.0–52.0)
Hemoglobin: 9.1 g/dL — ABNORMAL LOW (ref 13.0–17.0)
MCH: 28.1 pg (ref 26.0–34.0)
MCHC: 30 g/dL (ref 30.0–36.0)
MCV: 93.5 fL (ref 80.0–100.0)
Platelets: 199 10*3/uL (ref 150–400)
RBC: 3.24 MIL/uL — ABNORMAL LOW (ref 4.22–5.81)
RDW: 14 % (ref 11.5–15.5)
WBC: 7.7 10*3/uL (ref 4.0–10.5)
nRBC: 0 % (ref 0.0–0.2)

## 2021-07-09 LAB — PROTIME-INR
INR: 1.1 (ref 0.8–1.2)
Prothrombin Time: 13.6 seconds (ref 11.4–15.2)

## 2021-07-09 LAB — GLUCOSE, CAPILLARY
Glucose-Capillary: 119 mg/dL — ABNORMAL HIGH (ref 70–99)
Glucose-Capillary: 101 mg/dL — ABNORMAL HIGH (ref 70–99)

## 2021-07-09 MED ORDER — FENTANYL CITRATE (PF) 100 MCG/2ML IJ SOLN
INTRAMUSCULAR | Status: AC
  Filled 2021-07-09: qty 4

## 2021-07-09 MED ORDER — MIDAZOLAM HCL 2 MG/2ML IJ SOLN
INTRAMUSCULAR | Status: AC | PRN
Start: 2021-07-09 — End: 2021-07-09
  Administered 2021-07-09: 1 mg via INTRAVENOUS

## 2021-07-09 MED ORDER — GELATIN ABSORBABLE 12-7 MM EX MISC
CUTANEOUS | Status: AC
  Filled 2021-07-09: qty 1

## 2021-07-09 MED ORDER — HYDRALAZINE HCL 20 MG/ML IJ SOLN
INTRAMUSCULAR | Status: AC
  Administered 2021-07-09: 10 mg via INTRAVENOUS
  Filled 2021-07-09: qty 1

## 2021-07-09 MED ORDER — FENTANYL CITRATE (PF) 100 MCG/2ML IJ SOLN
INTRAMUSCULAR | Status: AC | PRN
  Administered 2021-07-09: 25 ug via INTRAVENOUS

## 2021-07-09 MED ORDER — MIDAZOLAM HCL 2 MG/2ML IJ SOLN
INTRAMUSCULAR | Status: AC
  Filled 2021-07-09: qty 4

## 2021-07-09 MED ORDER — LIDOCAINE HCL 1 % IJ SOLN
INTRAMUSCULAR | Status: AC
  Filled 2021-07-09: qty 10

## 2021-07-09 MED ORDER — HYDRALAZINE HCL 20 MG/ML IJ SOLN: 20.0000 mg | INTRAMUSCULAR | Status: AC

## 2021-07-09 MED ORDER — SODIUM CHLORIDE 0.9 % IV SOLN: INTRAVENOUS | Status: DC

## 2021-07-09 NOTE — Procedures (Signed)
Interventional Radiology Procedure Note ? ?Procedure: CT guided random renal biopsy, LEFT ? ?Complications: None ? ?Estimated Blood Loss: None ? ?Recommendations: ?- Bedrest x 3 hrs ?- DC home ? ? ?Signed, ? ?Criselda Peaches, MD ? ? ?

## 2021-07-09 NOTE — H&P (Signed)
? ?Chief Complaint: ?Patient was seen in consultation today for chronic kidney disease at the request of Patel,Jay ? ?Referring Physician(s): ?Patel,Jay ? ?Supervising Physician: Jacqulynn Cadet ? ?Patient Status: [facility] - Out-pt ? ?History of Present Illness: ?Eric Brady is a 88 y.o. male who presents with chronic kidney disease and pmh as below.  He has had an increasing creatinine over the past year, measured to be 4.2, elevated free light chains, and sup nephrotic range proteinuria.  He reported poor energy levels/fatigue and sensation of weakness.  He denies N/V/D, dysuria, urgency, frequency, flank pain, fever/chills, hematuria.  He denies any pain today.   ?IR has been consulted to perform renal biopsy. ? ?Past Medical History:  ?Diagnosis Date  ? Anemia   ? Asthma   ? only as a child  ? Cancer [facility])   ? Prostate  ? Diabetes mellitus without complication ([place])   ? History of radiation therapy   ? Hyperlipidemia   ? Squamous cell carcinoma of skin 08/12/2019  ? in situ on right ear rim(CX3&EXC)  ? ? ?Past Surgical History:  ?Procedure Laterality Date  ? COLONOSCOPY N/A 09/12/2012  ? Procedure: COLONOSCOPY;  Surgeon: Rogene Houston, MD;  Location: [facility] ENDO [facility];  Service: Endoscopy;  Laterality: N/A;  915  ? EYE SURGERY    ? LUMBAR LAMINECTOMY/DECOMPRESSION MICRODISCECTOMY Right 10/26/2020  ? Procedure: Microdiscectomy - Lumbar Three-Four Right;  Surgeon: Kary Kos, MD;  Location: [place];  Service: Neurosurgery;  Laterality: Right;  3C  ? ORIF ANKLE FRACTURE Right   ? YAG LASER APPLICATION Left 1/61/0960  ? Procedure: YAG LASER APPLICATION;  Surgeon: Williams Che, MD;  Location: [facility] ORS;  Service: Ophthalmology;  Laterality: Left;  ? ? ?Allergies: ?Patient has no known allergies. ? ?Medications: ?Prior to Admission medications   ?Medication Sig Start Date End Date Taking? Authorizing Provider  ?cholecalciferol (VITAMIN D3) 25 MCG (1000 UNIT) tablet Take 1,000 Units by mouth daily.   Yes [provider]  ?cyanocobalamin (,VITAMIN B-12,) 1000 MCG/ML injection Inject 1,000 mcg into the muscle every 30 (thirty) days.   Yes [provider]  ?ferrous sulfate 325 (65 FE) MG tablet    Yes [provider]  ?fludrocortisone (FLORINEF) 0.1 MG tablet Take 2 tablets (0.2 mg total) by mouth daily. 09/15/20  Yes BranchAlphonse Guild, MD  ?midodrine (PROAMATINE) 10 MG tablet Take 10 mg by mouth 3 (three) times daily.   Yes [provider]  ?Multiple Vitamin (MULTIVITAMIN) capsule Take 1 capsule by mouth daily.   Yes [provider]  ?pioglitazone (ACTOS) 15 MG tablet Take 15 mg by mouth every evening. 12/06/19  Yes [provider]  ?aspirin EC 81 MG tablet Take 81 mg by mouth daily. Swallow whole.    [provider]  ?atorvastatin (LIPITOR) 10 MG tablet Take 10 mg by mouth every evening.    [provider]  ?calcium-vitamin D (OSCAL WITH D) 500-200 MG-UNIT tablet Take 2 tablets by mouth daily.     [provider]  ?clobetasol (OLUX) 0.05 % topical foam Apply topically 2 (two) times daily. APPLY TO THE SCALP TWICE DAILY FOR 1 MONTH 05/05/21   [name], MD  ?cyclobenzaprine (FLEXERIL) 10 MG tablet Take 1 tablet (10 mg total) by mouth 3 (three) times daily as needed for muscle spasms. ?Patient not taking: Reported on 05/05/2021 10/27/20   Kary Kos, MD  ?EPINEPHrine (EPIPEN 2-PAK) 0.3 mg/0.3 mL IJ SOAJ injection Inject 0.3 mg into the muscle once as  needed for anaphylaxis. ?Patient not taking: Reported on 05/05/2021 12/18/19   [name], MD  ?HYDROcodone-acetaminophen Criselda Peaches) 5-325 mg TABS tablet Take one tablet by mouth every six hours as needed for pain ?Patient not taking: Reported on 05/05/2021 08/09/20   Sponseller, [name], PA-C  ?metFORMIN (GLUCOPHAGE-XR) 500 MG 24 hr tablet Take 1,000 mg by mouth every evening. ?Patient not taking: Reported on 05/05/2021 01/28/15   [provider]  ?Roma Schanz test strip 1 each daily.  08/10/19   [provider]  ?oxyCODONE 10 MG TABS Take 1 tablet (10 mg total) by mouth every 6 (six) hours as needed for severe pain ((score 7 to 10)). ?Patient not taking: Reported on 05/05/2021 10/27/20   Kary Kos, MD  ?traMADol (ULTRAM) 50 MG tablet Take 1 tablet (50 mg total) by mouth every 6 (six) hours as needed. ?Patient not taking: Reported on 10/20/2020 08/09/20   Emeline Darling, PA-C  ?  ? ?Family History  ?Problem Relation Age of Onset  ? Cancer Mother   ? Prostate cancer Brother   ? ? ?Social History  ? ?Socioeconomic History  ? Marital status: Married  ?  Spouse name: Not on file  ? Number of children: Not on file  ? Years of education: Not on file  ? Highest education level: Not on file  ?Occupational History  ? Not on file  ?Tobacco Use  ? Smoking status: Former  ?  Types: Cigarettes  ?  Quit date: 12/18/1990  ?  Years since quitting: 30.5  ? Smokeless tobacco: Never  ?Vaping Use  ? Vaping Use: Never used  ?Substance and Sexual Activity  ? Alcohol use: No  ? Drug use: Never  ? Sexual activity: Not on file  ?Other Topics Concern  ? Not on file  ?Social History Narrative  ? Not on file  ? ?Social Determinants of Health  ? ?Financial Resource Strain: Not on file  ?Food Insecurity: Not on file  ?Transportation Needs: Not on file  ?Physical Activity: Not on file  ?Stress: Not on file  ?Social Connections: Not on file  ? ? ?Review of Systems: A 12 point ROS discussed and pertinent positives are indicated in the HPI above.  All other systems are negative. ? ? ?Vital Signs: ?BP (!) 180/80   Pulse (!) 51   Temp (!) 97.4 ?F (36.3 ?C) (Oral)   Resp 16   Ht '5\' 10"'$  (1.778 m)   Wt 182 lb (82.6 kg)   SpO2 96%   BMI 26.11 kg/m?  ? ?Physical Exam ?Constitutional:   ?   General: He is not in acute distress. ?HENT:  ?   Head: Normocephalic.  ?   Mouth/Throat:  ?   Mouth: Mucous membranes are moist.  ?   Pharynx: Oropharynx is clear.  ?Eyes:  ?   Extraocular Movements: Extraocular movements intact.   ?Cardiovascular:  ?   Rate and Rhythm: Bradycardia present.  ?   Heart sounds: Normal heart sounds.  ?Pulmonary:  ?   Effort: Pulmonary effort is normal.  ?   Breath sounds: Normal breath sounds.  ?Abdominal:  ?   General: Abdomen is flat.  ?   Palpations: Abdomen is soft.  ?Skin: ?   General: Skin is warm and dry.  ?Neurological:  ?   General: No focal deficit present.  ?   Mental Status: He is alert.  ?Psychiatric:     ?   Mood and Affect: Mood normal.     ?  Behavior: Behavior normal.  ? ? ?Imaging: ?US RENAL ? ?Result Date: 06/26/2021 ?CLINICAL DATA:  Stage IV chronic renal disease. EXAM: RENAL / URINARY TRACT ULTRASOUND COMPLETE COMPARISON:  April 01, 2021 FINDINGS: Right Kidney: Renal measurements: 9.1 cm x 5.5 cm x 5.7 cm = volume: 147.8 mL. Echogenicity within normal limits. No mass or hydronephrosis visualized. Left Kidney: Renal measurements: 9.3 cm x 5.3 cm x 5.8 cm = volume: 150.8 mL. Echogenicity within normal limits. There is mild left-sided hydronephrosis. This is stable in severity when compared to the prior study. A 1.5 cm x 1.1 cm x 1.7 cm complex anechoic structure is seen within the upper pole of the left kidney. No abnormal flow is noted within this region on color Doppler evaluation. This represents a new finding when compared to the prior study. Bladder: The bladder is not completely distended and subsequently limited in evaluation. The urinary bladder walls are mildly irregular in appearance. A urinary bladder diverticulum is noted on the right. Other: None. IMPRESSION: 1. Mild, stable left-sided hydronephrosis. 2. Small, complex left renal cyst. Correlation with pre and post-contrast MRI or CT is recommended to exclude the presence of an underlying neoplastic process. These results will be called to the ordering clinician or representative by the Radiologist Assistant, and communication documented in the PACS or zVision Dashboard. 3. Irregular appearing urinary bladder walls which may  be, in part, due to its poorly distended nature. 4. Urinary bladder diverticulum. Electronically Signed   By: Virgina Norfolk M.D.   On: 06/26/2021 17:33   ? ?Labs: ? ?CBC: ?Recent Labs  ?  08/09/20 ?0957 10/22/20 ?1200

## 2021-07-16 ENCOUNTER — Encounter (HOSPITAL_COMMUNITY): Payer: Self-pay | Admitting: Nephrology

## 2021-07-16 LAB — SURGICAL PATHOLOGY

## 2021-08-05 DIAGNOSIS — D519 Vitamin B12 deficiency anemia, unspecified: Secondary | ICD-10-CM | POA: Diagnosis not present

## 2021-08-05 DIAGNOSIS — Z8042 Family history of malignant neoplasm of prostate: Secondary | ICD-10-CM | POA: Diagnosis not present

## 2021-08-05 DIAGNOSIS — C61 Malignant neoplasm of prostate: Secondary | ICD-10-CM | POA: Diagnosis not present

## 2021-08-07 IMAGING — MR MR LUMBAR SPINE W/O CM
5 series · 38 of 48 positions shown · non-contrast
Comparison: Radiography 08/09/2020

CLINICAL DATA: Low back pain and difficulty walking for 3 weeks.
History of prostate cancer

EXAM:
MRI LUMBAR SPINE WITHOUT CONTRAST
TECHNIQUE: Multiplanar, multisequence MR imaging of the lumbar spine was
performed. No intravenous contrast was administered.

[Series 5: T2 · sagittal · 4.0mm · 0.68mm/px · 6 of 17 slices shown (1 of 2)]
[im 1/17]
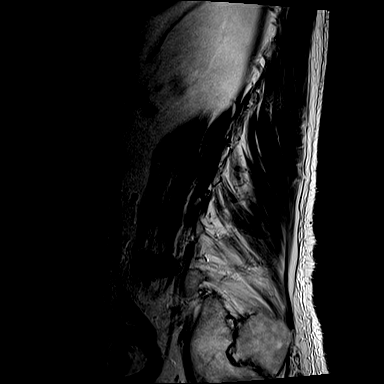
[im 4/17]
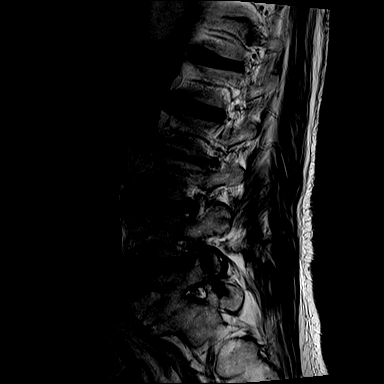
[im 7/17]
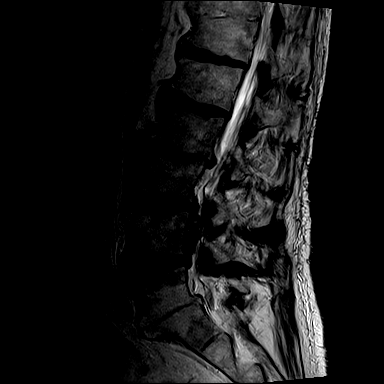
[im 10/17]
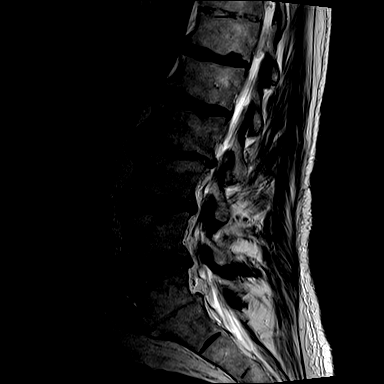
[im 13/17]
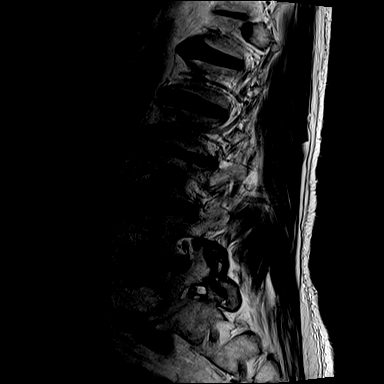
[im 17/17]
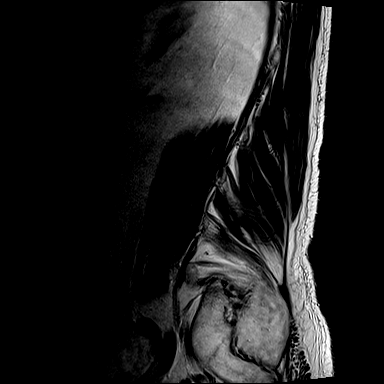

[Series 6: T1 · sagittal · 4.0mm · 0.81mm/px · 7 of 17 slices shown (1 of 2)]
[im 1/17]
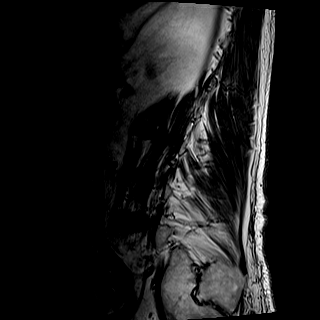
[im 3/17]
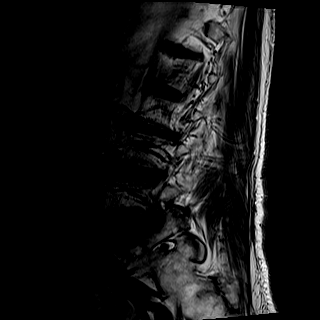
[im 6/17]
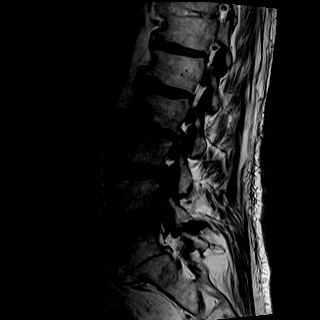
[im 9/17]
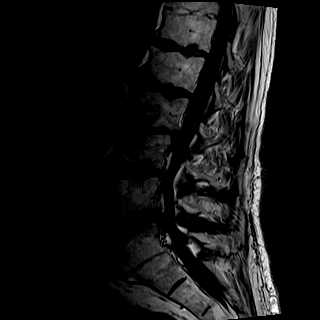
[im 11/17]
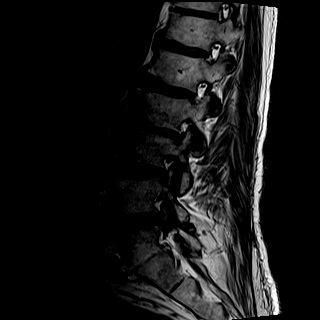
[im 14/17]
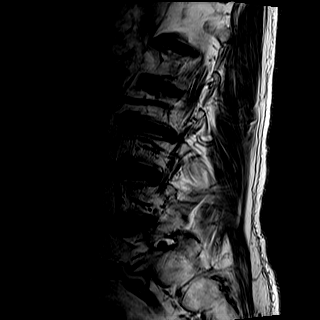
[im 17/17]
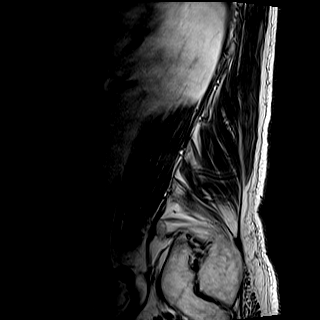

[Series 7: STIR · sagittal · 4.0mm · 0.51mm/px · 7 of 17 slices shown]
[im 1/17]
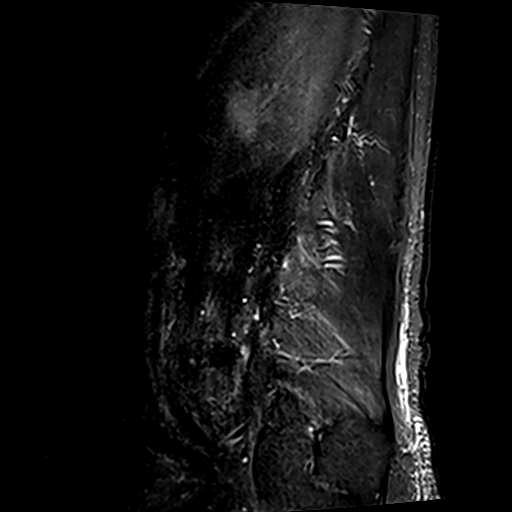
[im 3/17]
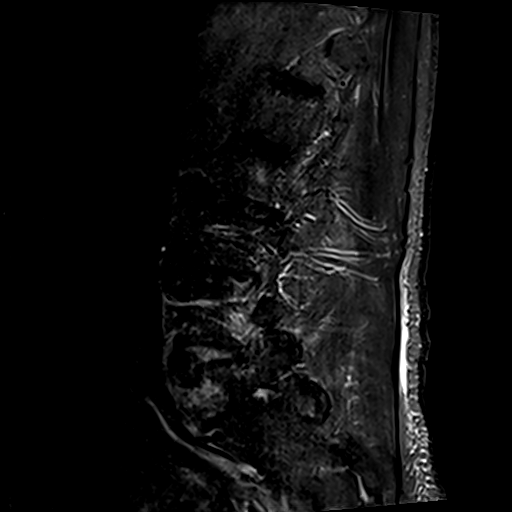
[im 6/17]
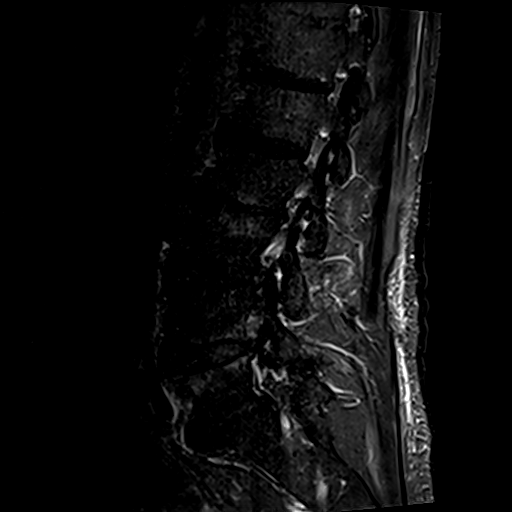
[im 9/17]
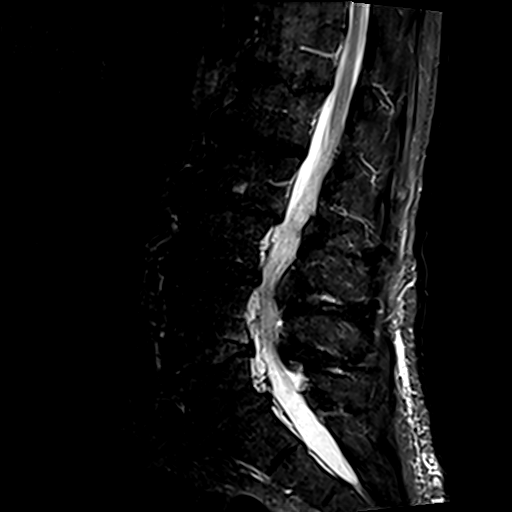
[im 11/17]
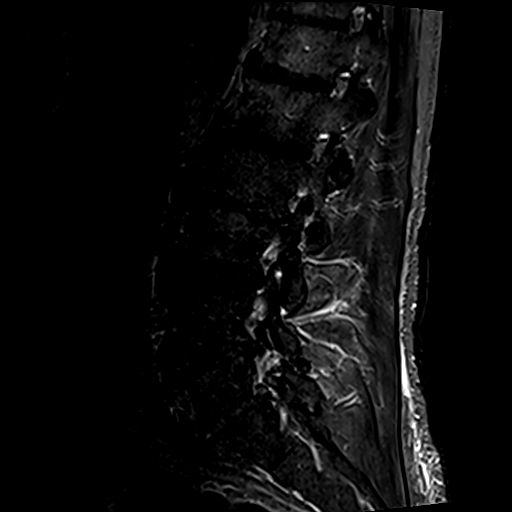
[im 14/17]
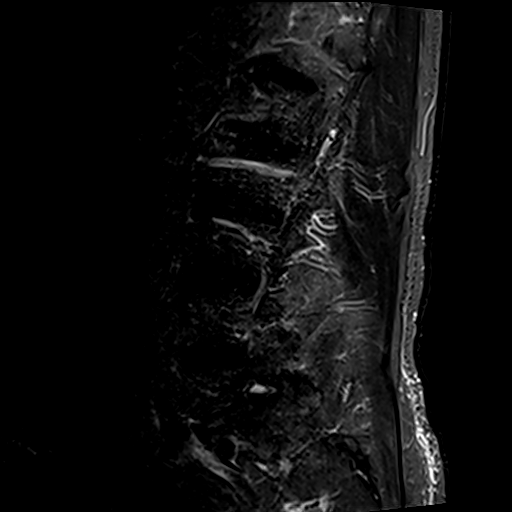
[im 17/17]
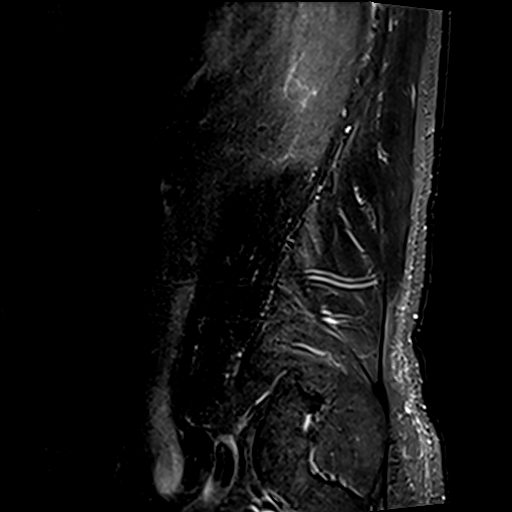

[Series 20: T1 · axial · 4.0mm · 0.74mm/px · z∈[-84,+111]mm · 8 of 34 slices shown (2 of 2)]
[im 1/34]
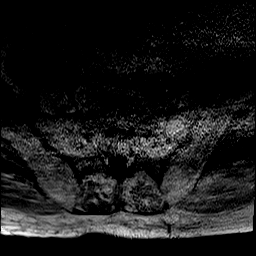
[im 6/34]
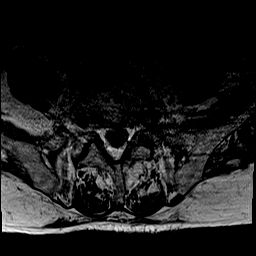
[im 11/34]
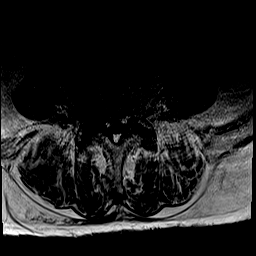
[im 16/34]
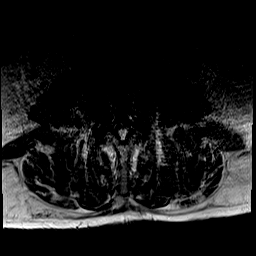
[im 18/34]
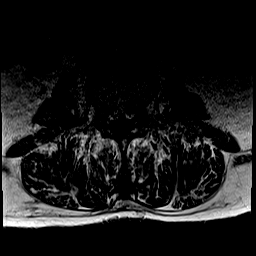
[im 23/34]
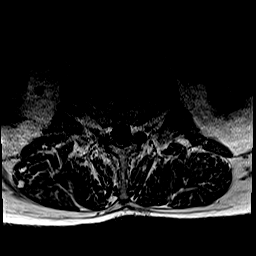
[im 28/34]
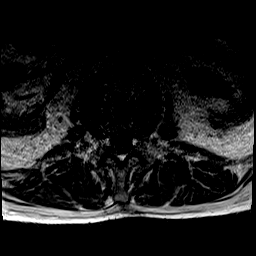
[im 34/34]
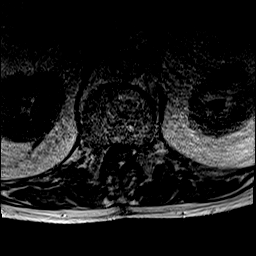

[Series 21: T2 · axial · 4.0mm · 0.70mm/px · z∈[-84,+110]mm · 10 of 34 slices shown (2 of 2)]
[im 1/34]
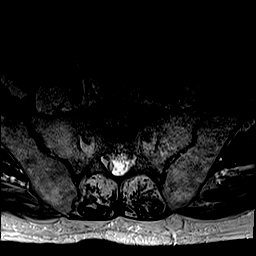
[im 3/34]
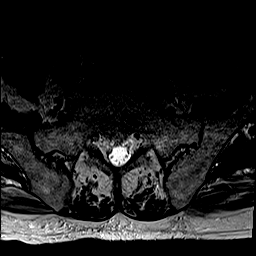
[im 6/34]
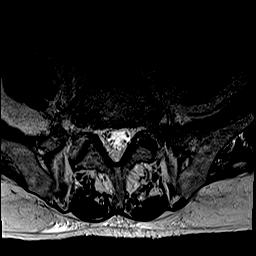
[im 8/34]
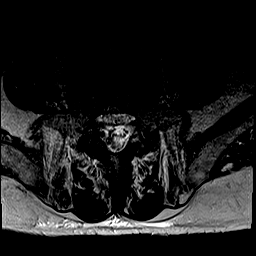
[im 11/34]
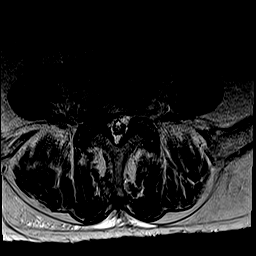
[im 16/34]
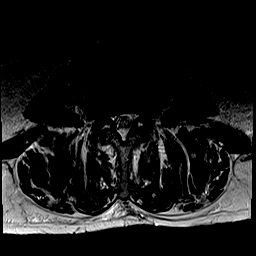
[im 18/34]
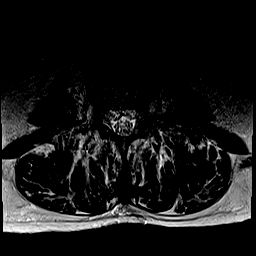
[im 23/34]
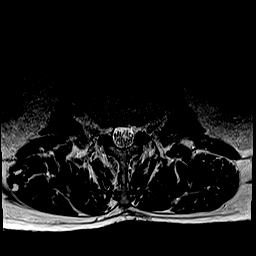
[im 28/34]
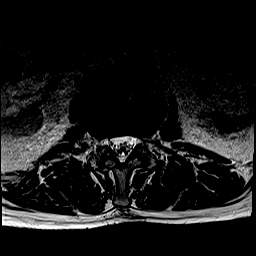
[im 34/34]
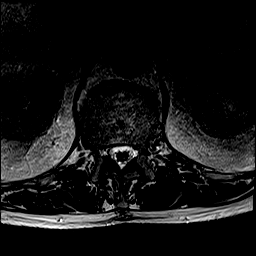

[38 of 48 positions shown; findings below may reference images not displayed]

FINDINGS: Segmentation:  5 lumbar type vertebrae

Alignment: Mild retrolisthesis at L2-3. mild fused anterolisthesis
at L5-S1.

Vertebrae: Intervertebral ankylosis at L5-S1 with accentuated fatty
marrow. Chronic Schmorl's node at the L2 inferior endplate. Chronic
L5 pars defects.

Conus medullaris and cauda equina: Conus extends to the L2 level.
Conus and cauda equina appear normal.

Paraspinal and other soft tissues: No evidence of perispinal mass or
inflammation.

Disc levels:

T12- L1: Disc narrowing and ventral spurring

L1-L2: Disc narrowing and mild ventral spurring

L2-L3: Disc collapse and endplate degeneration with bulging and
ridging. Mild facet spurring. The canal and foramina are patent

L3-L4: Disc narrowing and bulging with annular fissure. Degenerative
facet spurring. Facet osteoarthritis with spurring and ligamentous
thickening. Spinal stenosis with high-grade thecal sac narrowing and
complete CSF effacement.

L4-L5: Disc narrowing and bulging with right paracentral superiorly
migrating extrusion reaching the L3-4 disc space. Bilateral
degenerative facet spurring with ligamentous thickening. Severe
impingement on the right L4 nerve root along its descending course
and at the foramen. Mild left foraminal narrowing

L5-S1:Chronic pars defects.  L5-S1 intervertebral ankylosis.
IMPRESSION: 1. Large right paracentral disc extrusion behind the L4 vertebra,
spanning the L3-4 to L4-5 disc spaces. The parent disc is likely
L4-5. The extrusion causes severe right L4 impingement and
high-grade spinal stenosis at the level of the L3-4 disc space.
2. L5-S1 ankylosis.
3. Noncompressive degenerative changes described above.

## 2021-08-09 DIAGNOSIS — D631 Anemia in chronic kidney disease: Secondary | ICD-10-CM | POA: Diagnosis not present

## 2021-08-09 DIAGNOSIS — I951 Orthostatic hypotension: Secondary | ICD-10-CM | POA: Diagnosis not present

## 2021-08-09 DIAGNOSIS — N184 Chronic kidney disease, stage 4 (severe): Secondary | ICD-10-CM | POA: Diagnosis not present

## 2021-08-09 DIAGNOSIS — N189 Chronic kidney disease, unspecified: Secondary | ICD-10-CM | POA: Diagnosis not present

## 2021-08-09 DIAGNOSIS — N2581 Secondary hyperparathyroidism of renal origin: Secondary | ICD-10-CM | POA: Diagnosis not present

## 2021-08-10 ENCOUNTER — Encounter (HOSPITAL_COMMUNITY)
Admission: RE | Admit: 2021-08-10 | Discharge: 2021-08-10 | Disposition: A | Discharge status: Home / Self Care | Payer: Medicare Other | Source: Ambulatory Visit | Attending: Nephrology | Admitting: Nephrology

## 2021-08-10 ENCOUNTER — Encounter (HOSPITAL_COMMUNITY): Payer: Self-pay

## 2021-08-10 DIAGNOSIS — N184 Chronic kidney disease, stage 4 (severe): Secondary | ICD-10-CM | POA: Insufficient documentation

## 2021-08-10 DIAGNOSIS — D631 Anemia in chronic kidney disease: Secondary | ICD-10-CM | POA: Diagnosis not present

## 2021-08-10 LAB — CBC WITH DIFFERENTIAL/PLATELET
Abs Immature Granulocytes: 0.02 10*3/uL (ref 0.00–0.07)
Basophils Absolute: 0 10*3/uL (ref 0.0–0.1)
Basophils Relative: 1 %
Eosinophils Absolute: 0.1 10*3/uL (ref 0.0–0.5)
Eosinophils Relative: 1 %
HCT: 30 % — ABNORMAL LOW (ref 39.0–52.0)
Hemoglobin: 9 g/dL — ABNORMAL LOW (ref 13.0–17.0)
Immature Granulocytes: 0 %
Lymphocytes Relative: 28 %
Lymphs Abs: 2.4 10*3/uL (ref 0.7–4.0)
MCH: 27.5 pg (ref 26.0–34.0)
MCHC: 30 g/dL (ref 30.0–36.0)
MCV: 91.7 fL (ref 80.0–100.0)
Monocytes Absolute: 0.6 10*3/uL (ref 0.1–1.0)
Monocytes Relative: 7 %
Neutro Abs: 5.4 10*3/uL (ref 1.7–7.7)
Neutrophils Relative %: 63 %
Platelets: 180 10*3/uL (ref 150–400)
RBC: 3.27 MIL/uL — ABNORMAL LOW (ref 4.22–5.81)
RDW: 13.6 % (ref 11.5–15.5)
WBC: 8.5 10*3/uL (ref 4.0–10.5)
nRBC: 0 % (ref 0.0–0.2)

## 2021-08-10 LAB — IRON AND TIBC
Iron: 104 ug/dL (ref 45–182)
Saturation Ratios: 31 % (ref 17.9–39.5)
TIBC: 333 ug/dL (ref 250–450)
UIBC: 229 ug/dL

## 2021-08-10 LAB — POCT HEMOGLOBIN-HEMACUE: Hemoglobin: 8.9 g/dL — ABNORMAL LOW (ref 13.0–17.0)

## 2021-08-10 LAB — FERRITIN: Ferritin: 131 ng/mL (ref 24–336)

## 2021-08-10 MED ORDER — EPOETIN ALFA-EPBX 10000 UNIT/ML IJ SOLN
INTRAMUSCULAR | Status: AC
  Filled 2021-08-10: qty 1

## 2021-08-10 MED ORDER — EPOETIN ALFA-EPBX 10000 UNIT/ML IJ SOLN
10000.0000 [IU] | Freq: Once | INTRAMUSCULAR | Status: AC
  Administered 2021-08-10: 10000 [IU] via SUBCUTANEOUS

## 2021-08-26 ENCOUNTER — Encounter (HOSPITAL_COMMUNITY)
Admission: RE | Admit: 2021-08-26 | Discharge: 2021-08-26 | Disposition: A | Discharge status: Home / Self Care | Payer: Medicare Other | Source: Ambulatory Visit | Attending: Nephrology | Admitting: Nephrology

## 2021-08-26 VITALS — BP 102/56 | HR 62 | Resp 16

## 2021-08-26 DIAGNOSIS — N184 Chronic kidney disease, stage 4 (severe): Secondary | ICD-10-CM | POA: Diagnosis not present

## 2021-08-26 DIAGNOSIS — C61 Malignant neoplasm of prostate: Secondary | ICD-10-CM

## 2021-08-26 DIAGNOSIS — D631 Anemia in chronic kidney disease: Secondary | ICD-10-CM | POA: Diagnosis not present

## 2021-08-26 LAB — POCT HEMOGLOBIN-HEMACUE: Hemoglobin: 10 g/dL — ABNORMAL LOW (ref 13.0–17.0)

## 2021-08-26 MED ORDER — EPOETIN ALFA-EPBX 10000 UNIT/ML IJ SOLN
INTRAMUSCULAR | Status: AC
  Filled 2021-08-26: qty 1

## 2021-08-26 MED ORDER — EPOETIN ALFA-EPBX 10000 UNIT/ML IJ SOLN
10000.0000 [IU] | Freq: Once | INTRAMUSCULAR | Status: AC
  Administered 2021-08-26: 10000 [IU] via SUBCUTANEOUS

## 2021-08-26 NOTE — Progress Notes (Signed)
Pt arrived via w/c for his hgb check and retacrit injection. C/O feeling weak and pain in neck when turning head to the left. BP-102/56, P-62, R-16, O2 sat 92% on room air. Hgb 10.0. Asked pt if he wanted me to call Dr Beverlee Nims office and get him an appt. Pt said yes. Called Dr Beverlee Nims office, talked to [place]. Explained situation. Kathlee Nations stated no appt's available. Kathlee Nations suggested for pt to go to ER. Pt is in agreement to go to ER. Daughter at pt side. I explained situation to her and she suggested for pt to go home and she would  go by Dr Beverlee Nims office to get a RX for pain med for his neck. Pt was in agreement for that. Daughter and pt then refused visit to ER. Pt was d/c to home via w/c with daughter in [place]. Daughter stated that pt has been c/o of weakness and pain in neck for "some time now."

## 2021-08-27 NOTE — Progress Notes (Signed)
08/27/21- 1500 Attempted to contact Eric Brady to see how he was doing. No answer. Message left.   Talked with daughter-Eric Brady. She stated pt was doing better. Went to Dr Ria Comment office after d/c and he is referring pt to orthopedic for his neck pain. Pt did have pain med, that Dt Posey Pronto had given him, at home and once he ate he took a "pain pill" and was feeling better.

## 2021-09-07 ENCOUNTER — Encounter (HOSPITAL_COMMUNITY)
Admission: RE | Admit: 2021-09-07 | Discharge: 2021-09-07 | Disposition: A | Discharge status: Home / Self Care | Payer: Medicare Other | Source: Ambulatory Visit | Attending: Nephrology | Admitting: Nephrology

## 2021-09-07 ENCOUNTER — Encounter (HOSPITAL_COMMUNITY): Payer: Self-pay

## 2021-09-07 VITALS — BP 141/74 | HR 56 | Temp 97.8°F | Resp 18

## 2021-09-07 DIAGNOSIS — N184 Chronic kidney disease, stage 4 (severe): Secondary | ICD-10-CM | POA: Diagnosis not present

## 2021-09-07 DIAGNOSIS — N181 Chronic kidney disease, stage 1: Secondary | ICD-10-CM | POA: Insufficient documentation

## 2021-09-07 LAB — CBC
HCT: 33.2 % — ABNORMAL LOW (ref 39.0–52.0)
Hemoglobin: 9.9 g/dL — ABNORMAL LOW (ref 13.0–17.0)
MCH: 28.9 pg (ref 26.0–34.0)
MCHC: 29.8 g/dL — ABNORMAL LOW (ref 30.0–36.0)
MCV: 96.8 fL (ref 80.0–100.0)
Platelets: 182 10*3/uL (ref 150–400)
RBC: 3.43 MIL/uL — ABNORMAL LOW (ref 4.22–5.81)
RDW: 15.4 % (ref 11.5–15.5)
WBC: 6 10*3/uL (ref 4.0–10.5)
nRBC: 0 % (ref 0.0–0.2)

## 2021-09-07 LAB — IRON AND TIBC
Iron: 84 ug/dL (ref 45–182)
Saturation Ratios: 26 % (ref 17.9–39.5)
TIBC: 324 ug/dL (ref 250–450)
UIBC: 240 ug/dL

## 2021-09-07 LAB — POCT HEMOGLOBIN-HEMACUE: Hemoglobin: 10 g/dL — ABNORMAL LOW (ref 13.0–17.0)

## 2021-09-07 LAB — FERRITIN: Ferritin: 76 ng/mL (ref 24–336)

## 2021-09-07 MED ORDER — EPOETIN ALFA-EPBX 10000 UNIT/ML IJ SOLN
INTRAMUSCULAR | Status: AC
  Administered 2021-09-07: 10000 [IU]
  Filled 2021-09-07: qty 1

## 2021-09-07 MED ORDER — EPOETIN ALFA-EPBX 10000 UNIT/ML IJ SOLN: 10000.0000 [IU] | Freq: Once | INTRAMUSCULAR | Status: DC

## 2021-09-09 ENCOUNTER — Ambulatory Visit (HOSPITAL_COMMUNITY): Payer: Medicare Other

## 2021-09-14 DIAGNOSIS — D519 Vitamin B12 deficiency anemia, unspecified: Secondary | ICD-10-CM | POA: Diagnosis not present

## 2021-09-21 ENCOUNTER — Encounter (HOSPITAL_COMMUNITY)
Admission: RE | Admit: 2021-09-21 | Discharge: 2021-09-21 | Disposition: A | Discharge status: Home / Self Care | Payer: Medicare Other | Source: Ambulatory Visit | Attending: Nephrology | Admitting: Nephrology

## 2021-09-21 ENCOUNTER — Encounter (HOSPITAL_COMMUNITY): Payer: Self-pay

## 2021-09-21 ENCOUNTER — Encounter (HOSPITAL_COMMUNITY): Payer: Medicare Other

## 2021-09-21 VITALS — BP 172/79 | HR 55 | Temp 97.8°F | Resp 18

## 2021-09-21 DIAGNOSIS — N181 Chronic kidney disease, stage 1: Secondary | ICD-10-CM | POA: Diagnosis not present

## 2021-09-21 DIAGNOSIS — N184 Chronic kidney disease, stage 4 (severe): Secondary | ICD-10-CM | POA: Diagnosis not present

## 2021-09-21 LAB — POCT HEMOGLOBIN-HEMACUE: Hemoglobin: 11 g/dL — ABNORMAL LOW (ref 13.0–17.0)

## 2021-09-21 MED ORDER — EPOETIN ALFA-EPBX 10000 UNIT/ML IJ SOLN
10000.0000 [IU] | Freq: Once | INTRAMUSCULAR | Status: AC
  Administered 2021-09-21: 10000 [IU] via SUBCUTANEOUS

## 2021-09-21 MED ORDER — EPOETIN ALFA-EPBX 10000 UNIT/ML IJ SOLN
INTRAMUSCULAR | Status: AC
  Filled 2021-09-21: qty 1

## 2021-09-22 DIAGNOSIS — E1129 Type 2 diabetes mellitus with other diabetic kidney complication: Secondary | ICD-10-CM | POA: Diagnosis not present

## 2021-09-22 DIAGNOSIS — N184 Chronic kidney disease, stage 4 (severe): Secondary | ICD-10-CM | POA: Diagnosis not present

## 2021-09-29 DIAGNOSIS — N184 Chronic kidney disease, stage 4 (severe): Secondary | ICD-10-CM | POA: Diagnosis not present

## 2021-09-29 DIAGNOSIS — M47892 Other spondylosis, cervical region: Secondary | ICD-10-CM | POA: Diagnosis not present

## 2021-09-29 DIAGNOSIS — E1129 Type 2 diabetes mellitus with other diabetic kidney complication: Secondary | ICD-10-CM | POA: Diagnosis not present

## 2021-10-12 ENCOUNTER — Encounter (HOSPITAL_COMMUNITY)
Admission: RE | Admit: 2021-10-12 | Discharge: 2021-10-12 | Disposition: A | Discharge status: Home / Self Care | Payer: Medicare Other | Source: Ambulatory Visit | Attending: Nephrology | Admitting: Nephrology

## 2021-10-12 VITALS — BP 178/83 | HR 52 | Temp 97.9°F | Resp 18

## 2021-10-12 DIAGNOSIS — D631 Anemia in chronic kidney disease: Secondary | ICD-10-CM | POA: Diagnosis not present

## 2021-10-12 DIAGNOSIS — N184 Chronic kidney disease, stage 4 (severe): Secondary | ICD-10-CM | POA: Insufficient documentation

## 2021-10-12 LAB — CBC
HCT: 35.8 % — ABNORMAL LOW (ref 39.0–52.0)
Hemoglobin: 10.9 g/dL — ABNORMAL LOW (ref 13.0–17.0)
MCH: 28.8 pg (ref 26.0–34.0)
MCHC: 30.4 g/dL (ref 30.0–36.0)
MCV: 94.5 fL (ref 80.0–100.0)
Platelets: 177 10*3/uL (ref 150–400)
RBC: 3.79 MIL/uL — ABNORMAL LOW (ref 4.22–5.81)
RDW: 13.5 % (ref 11.5–15.5)
WBC: 6.5 10*3/uL (ref 4.0–10.5)
nRBC: 0 % (ref 0.0–0.2)

## 2021-10-12 LAB — POCT HEMOGLOBIN-HEMACUE: Hemoglobin: 11.5 g/dL — ABNORMAL LOW (ref 13.0–17.0)

## 2021-10-12 MED ORDER — EPOETIN ALFA 10000 UNIT/ML IJ SOLN
10000.0000 [IU] | Freq: Once | INTRAMUSCULAR | Status: AC
  Administered 2021-10-12: 10000 [IU] via SUBCUTANEOUS
  Filled 2021-10-12: qty 1

## 2021-10-12 MED ORDER — EPOETIN ALFA-EPBX 10000 UNIT/ML IJ SOLN: 10000.0000 [IU] | Freq: Once | INTRAMUSCULAR | Status: DC

## 2021-10-14 ENCOUNTER — Ambulatory Visit: Payer: Medicare Other | Admitting: Orthopedic Surgery

## 2021-10-14 ENCOUNTER — Ambulatory Visit (INDEPENDENT_AMBULATORY_CARE_PROVIDER_SITE_OTHER): Payer: Medicare Other

## 2021-10-14 ENCOUNTER — Encounter: Payer: Self-pay | Admitting: Orthopedic Surgery

## 2021-10-14 VITALS — BP 128/68 | HR 63 | Ht 70.0 in | Wt 182.0 lb

## 2021-10-14 DIAGNOSIS — M542 Cervicalgia: Secondary | ICD-10-CM

## 2021-10-14 DIAGNOSIS — M47812 Spondylosis without myelopathy or radiculopathy, cervical region: Secondary | ICD-10-CM

## 2021-10-14 NOTE — Progress Notes (Signed)
Chief Complaint  Patient presents with   Neck Pain    Pain down back of neck for months, feels relief when leaning forward.     HPI: 88 year old male with a had a lumbar discectomy by Dr. Saintclair Halsted about a year ago presents with 39-monthhistory of pain in the cervical spine.  Denies any numbness or tingling in his hands or feet.  He is on medication to increase his blood count for anemia  His ferritin is within [place] occasionally See some clumsiness with his gait but are treating this more to his weakness from his anemia  He denies any numbness or tingling in his upper extremities  Past Medical History:  Diagnosis Date   Anemia    Asthma    only as a child   Cancer ([place]    Prostate   Diabetes mellitus without complication ([place]    History of radiation therapy    Hyperlipidemia    Squamous cell carcinoma of skin 08/12/2019   in situ on right ear rim(CX3&EXC)    BP 128/68   Pulse 63   Ht '5\' 10"'$  (1.778 m)   Wt 182 lb (82.6 kg)   BMI 26.11 kg/m    General appearance: Well-developed well-nourished no gross deformities  Cardiovascular normal pulse and perfusion normal color without edema  Neurologically no sensation loss or deficits or pathologic reflexes  Psychological: Awake alert and oriented x3 mood and affect normal  Skin no lacerations or ulcerations no nodularity no palpable masses, no erythema or nodularity  Musculoskeletal: Patient has returned today  He has tenderness on the midline base of the cervical spine of the left trapezius muscle with decreased range of motion in flexion extension and rotation but negative Spurling sign    Imaging C-spine imaging from 2016 showed extensive degenerative disc disease  Repeat x-rays today again we see the extensive degenerative disc disease, loss of cervical lordosis, subluxation of 3 on 4 mild C5 4 5 disc space obliteration 5 6 narrowing 6 7 narrowing anterior osteophytes 4 through 6 and 7.    A/P  88year old male  6 months of back pain does not seem to have any cervical biologic disease at this time he may be starting to get some symptoms related to that  Recommend Tylenol and Advil and physical therapy  If he does not improve or has progressive neurologic symptoms then we can consult Dr. CSaintclair Halstedfor further intervention  I would not recommend surgery at this time

## 2021-10-14 NOTE — Patient Instructions (Addendum)
Rec 500 mg tylenol every 6 hrs as needed   Add advil 400 mg every 8 hrs as needed (dose reduced for anemia)   Physical therapy   Call the office if you notice no improvement after 4 weeks of therapy and we can get Dr Saintclair Halsted involved   Physical therapy has been ordered for you at [facility]. They should call you to schedule, [phone] is the phone number to call, if you want to call to schedule.

## 2021-10-19 DIAGNOSIS — N2581 Secondary hyperparathyroidism of renal origin: Secondary | ICD-10-CM | POA: Diagnosis not present

## 2021-10-19 DIAGNOSIS — D631 Anemia in chronic kidney disease: Secondary | ICD-10-CM | POA: Diagnosis not present

## 2021-10-19 DIAGNOSIS — D519 Vitamin B12 deficiency anemia, unspecified: Secondary | ICD-10-CM | POA: Diagnosis not present

## 2021-10-19 DIAGNOSIS — N184 Chronic kidney disease, stage 4 (severe): Secondary | ICD-10-CM | POA: Diagnosis not present

## 2021-10-26 ENCOUNTER — Encounter (HOSPITAL_COMMUNITY)
Admission: RE | Admit: 2021-10-26 | Discharge: 2021-10-26 | Disposition: A | Discharge status: Home / Self Care | Payer: Medicare Other | Source: Ambulatory Visit | Attending: Nephrology | Admitting: Nephrology

## 2021-10-26 ENCOUNTER — Encounter (HOSPITAL_COMMUNITY): Payer: Self-pay

## 2021-10-26 VITALS — BP 131/90 | HR 55 | Temp 98.1°F | Resp 18 | Ht 70.0 in | Wt 182.0 lb

## 2021-10-26 DIAGNOSIS — D631 Anemia in chronic kidney disease: Secondary | ICD-10-CM | POA: Diagnosis not present

## 2021-10-26 DIAGNOSIS — N182 Chronic kidney disease, stage 2 (mild): Secondary | ICD-10-CM

## 2021-10-26 DIAGNOSIS — N184 Chronic kidney disease, stage 4 (severe): Secondary | ICD-10-CM | POA: Diagnosis not present

## 2021-10-26 LAB — POCT HEMOGLOBIN-HEMACUE: Hemoglobin: 11.6 g/dL — ABNORMAL LOW (ref 13.0–17.0)

## 2021-10-26 MED ORDER — EPOETIN ALFA-EPBX 40000 UNIT/ML IJ SOLN
INTRAMUSCULAR | Status: AC
  Filled 2021-10-26: qty 1

## 2021-10-26 MED ORDER — EPOETIN ALFA-EPBX 10000 UNIT/ML IJ SOLN: 10000.0000 [IU] | Freq: Once | INTRAMUSCULAR | Status: DC

## 2021-10-26 MED ORDER — EPOETIN ALFA 10000 UNIT/ML IJ SOLN
INTRAMUSCULAR | Status: AC
  Administered 2021-10-26: 10000 [IU]
  Filled 2021-10-26: qty 1

## 2021-11-09 ENCOUNTER — Encounter (HOSPITAL_COMMUNITY)
Admission: RE | Admit: 2021-11-09 | Discharge: 2021-11-09 | Disposition: A | Discharge status: Home / Self Care | Payer: Medicare Other | Source: Ambulatory Visit | Attending: Nephrology | Admitting: Nephrology

## 2021-11-09 VITALS — BP 158/85 | HR 56 | Temp 98.1°F | Resp 18 | Ht 70.0 in | Wt 182.0 lb

## 2021-11-09 DIAGNOSIS — N184 Chronic kidney disease, stage 4 (severe): Secondary | ICD-10-CM | POA: Diagnosis not present

## 2021-11-09 DIAGNOSIS — N1832 Chronic kidney disease, stage 3b: Secondary | ICD-10-CM | POA: Insufficient documentation

## 2021-11-09 LAB — CBC WITH DIFFERENTIAL/PLATELET
Abs Immature Granulocytes: 0.02 10*3/uL (ref 0.00–0.07)
Basophils Absolute: 0.1 10*3/uL (ref 0.0–0.1)
Basophils Relative: 1 %
Eosinophils Absolute: 0.1 10*3/uL (ref 0.0–0.5)
Eosinophils Relative: 2 %
HCT: 40.1 % (ref 39.0–52.0)
Hemoglobin: 12 g/dL — ABNORMAL LOW (ref 13.0–17.0)
Immature Granulocytes: 0 %
Lymphocytes Relative: 36 %
Lymphs Abs: 2.2 10*3/uL (ref 0.7–4.0)
MCH: 28.4 pg (ref 26.0–34.0)
MCHC: 29.9 g/dL — ABNORMAL LOW (ref 30.0–36.0)
MCV: 95 fL (ref 80.0–100.0)
Monocytes Absolute: 0.4 10*3/uL (ref 0.1–1.0)
Monocytes Relative: 7 %
Neutro Abs: 3.2 10*3/uL (ref 1.7–7.7)
Neutrophils Relative %: 54 %
Platelets: 197 10*3/uL (ref 150–400)
RBC: 4.22 MIL/uL (ref 4.22–5.81)
RDW: 14.1 % (ref 11.5–15.5)
WBC: 5.9 10*3/uL (ref 4.0–10.5)
nRBC: 0 % (ref 0.0–0.2)

## 2021-11-09 LAB — FERRITIN: Ferritin: 49 ng/mL (ref 24–336)

## 2021-11-09 LAB — POCT HEMOGLOBIN-HEMACUE: Hemoglobin: 12.1 g/dL — ABNORMAL LOW (ref 13.0–17.0)

## 2021-11-09 LAB — IRON AND TIBC
Iron: 92 ug/dL (ref 45–182)
Saturation Ratios: 28 % (ref 17.9–39.5)
TIBC: 324 ug/dL (ref 250–450)
UIBC: 232 ug/dL

## 2021-11-09 MED ORDER — EPOETIN ALFA-EPBX 10000 UNIT/ML IJ SOLN: 10000.0000 [IU] | Freq: Once | INTRAMUSCULAR | Status: DC

## 2021-11-10 ENCOUNTER — Ambulatory Visit: Payer: Medicare Other | Admitting: Orthopedic Surgery

## 2021-11-23 ENCOUNTER — Encounter (HOSPITAL_COMMUNITY)
Admission: RE | Admit: 2021-11-23 | Discharge: 2021-11-23 | Disposition: A | Discharge status: Home / Self Care | Payer: Medicare Other | Source: Ambulatory Visit | Attending: Nephrology | Admitting: Nephrology

## 2021-11-23 VITALS — BP 122/80 | HR 56 | Temp 98.5°F | Resp 18

## 2021-11-23 DIAGNOSIS — N184 Chronic kidney disease, stage 4 (severe): Secondary | ICD-10-CM | POA: Diagnosis not present

## 2021-11-23 DIAGNOSIS — N1832 Chronic kidney disease, stage 3b: Secondary | ICD-10-CM | POA: Diagnosis not present

## 2021-11-23 DIAGNOSIS — D519 Vitamin B12 deficiency anemia, unspecified: Secondary | ICD-10-CM | POA: Diagnosis not present

## 2021-11-23 LAB — POCT HEMOGLOBIN-HEMACUE: Hemoglobin: 11.6 g/dL — ABNORMAL LOW (ref 13.0–17.0)

## 2021-11-23 MED ORDER — EPOETIN ALFA-EPBX 10000 UNIT/ML IJ SOLN
10000.0000 [IU] | Freq: Once | INTRAMUSCULAR | Status: DC
Start: 2021-11-23 — End: 2021-11-23

## 2021-11-23 MED ORDER — EPOETIN ALFA 10000 UNIT/ML IJ SOLN
INTRAMUSCULAR | Status: AC
  Filled 2021-11-23: qty 1

## 2021-11-23 MED ORDER — EPOETIN ALFA 10000 UNIT/ML IJ SOLN
10000.0000 [IU] | Freq: Once | INTRAMUSCULAR | Status: AC
  Administered 2021-11-23: 10000 [IU] via SUBCUTANEOUS

## 2021-11-24 ENCOUNTER — Ambulatory Visit (HOSPITAL_COMMUNITY): Payer: Medicare Other | Attending: Orthopedic Surgery | Admitting: Physical Therapy

## 2021-11-24 DIAGNOSIS — M542 Cervicalgia: Secondary | ICD-10-CM | POA: Insufficient documentation

## 2021-11-24 DIAGNOSIS — M47812 Spondylosis without myelopathy or radiculopathy, cervical region: Secondary | ICD-10-CM | POA: Insufficient documentation

## 2021-11-24 NOTE — Therapy (Signed)
OUTPATIENT PHYSICAL THERAPY CERVICAL EVALUATION   Patient Name: Eric Brady MRN: [id number] DOB:24-Jun-1932, 88 y.o., male Today's Date: 11/24/2021   PT End of Session - 11/24/21 1637     Visit Number 1    Number of Visits 8    Date for PT Re-Evaluation 12/31/21    Authorization Type BCBS medicare    PT Start Time 1600    PT Stop Time 1638    PT Time Calculation (min) 38 min             Past Medical History:  Diagnosis Date   Anemia    Asthma    only as a child   Cancer ([place])    Prostate   Diabetes mellitus without complication ([place])    History of radiation therapy    Hyperlipidemia    Squamous cell carcinoma of skin 08/12/2019   in situ on right ear rim(CX3&EXC)   Past Surgical History:  Procedure Laterality Date   COLONOSCOPY N/A 09/12/2012   Procedure: COLONOSCOPY;  Surgeon: Rogene Houston, MD;  [facility];  Service: Endoscopy;  Laterality: N/A;  915   EYE SURGERY     LUMBAR LAMINECTOMY/DECOMPRESSION MICRODISCECTOMY Right 10/26/2020   Procedure: Microdiscectomy - Lumbar Three-Four Right;  Surgeon: Kary Kos, MD;  Location: [facility];  Service: Neurosurgery;  Laterality: Right;  3C   ORIF ANKLE FRACTURE Right    YAG LASER APPLICATION Left 0/27/2536   Procedure: YAG LASER APPLICATION;  Surgeon: Williams Che, MD;  Location: [facility];  Service: Ophthalmology;  Laterality: Left;      PCP: Asencion Noble  REFERRING PROVIDER: Arther Abbott   REFERRING DIAG: M54.2 (ICD-10-CM) - Neck pain M47.812 (ICD-10-CM) - Spondylosis without myelopathy or radiculopathy, cervical region   THERAPY DIAG:  Cervicalgia  Rationale for Evaluation and Treatment Rehabilitation  ONSET DATE: chronic with acute exacerbation 5/23.   SUBJECTIVE:                                                                                                                                                                                                         SUBJECTIVE STATEMENT: Mr.  Shorten states that his neck has been bothering him for a year now. The pain is mainly on the left and does not radiate.  The pain is mainly when he moves his neck.   PERTINENT HISTORY:  DM, lumbar surgery   PAIN:  Are you having pain? Yes: NPRS scale: 5/10 highest pain is an 8/10  Pain location: Left cervical Pain description: ache and sharp  Aggravating factors:  moving his head  Relieving factors: tylenol   PRECAUTIONS: None  WEIGHT BEARING RESTRICTIONS No  FALLS:  Has patient fallen in last 6 months? No  LIVING ENVIRONMENT: Lives with: lives alone Lives in: House/apartment  OCCUPATION: retired   PLOF: Requires assistive device for independence  PATIENT GOALS Less pain and to be able to move his head better.   OBJECTIVE:   DIAGNOSTIC FINDINGS:  The cervical lordosis is completely obliterated the cervical spine is Nalgest straight he has mid level upper and lower lateral cervical degenerative changes in the uncovertebral joints   The degenerative changes start at 2133 and 4 there is subluxation of 3 on 4 4 and 5 have obliteration of the disc space with large anterior osteophytes 5 and 6 disc space is narrowed 6 and 7 is completely obliterated  Reading: Advanced cervical spondylosis   PATIENT SURVEYS:  FOTO 45   COGNITION: Overall cognitive status: Within functional limits for tasks assessed    POSTURE: rounded shoulders, forward head, and increased thoracic kyphosis  PALPATION: Noted mm spasms to B trap area    CERVICAL ROM:   Active ROM A/PROM (deg) eval  Flexion 40  Extension 40  Right lateral flexion 12  Left lateral flexion 15  Right rotation 50  Left rotation 45   (Blank rows = not tested) Cervical strength:  SB B 4/5 ; extension 3+/5  UPPER EXTREMITY ROM:  WFL   UPPER EXTREMITY MMT: WFL    TODAY'S TREATMENT:  Evaluation and HEP    PATIENT EDUCATION:  Education details: The importance of posture in cervical health and HEP Person  educated: Patient Education method: Explanation Education comprehension: verbalized understanding   HOME EXERCISE PROGRAM:[id number] Scapular retraction Cervical retraction Cervical ROM (excursions)   ASSESSMENT:  CLINICAL IMPRESSION: Patient is a 88 y.o. male who was seen today for physical therapy evaluation and treatment for cervical pain.  Evaluation demonstrate decreased cervical ROM, decreased strength, postural dysfunction and increased pain.  Mr. Sahni will benefit from skilled PT to address these issues and decrease his pain.   OBJECTIVE IMPAIRMENTS decreased ROM, decreased strength, hypomobility, increased fascial restrictions, postural dysfunction, and pain.   ACTIVITY LIMITATIONS carrying, lifting, and reach over head  PARTICIPATION LIMITATIONS: cleaning and laundry  PERSONAL FACTORS Age, Fitness, and Time since onset of injury/illness/exacerbation are also affecting patient's functional outcome.   REHAB POTENTIAL: Good  CLINICAL DECISION MAKING: Stable/uncomplicated  EVALUATION COMPLEXITY: Low   GOALS: Goals reviewed with patient? No  SHORT TERM GOALS: Target date: 12/15/2021 Pt will start in a week   PT to be I in HEP to decrease pain to no greater than a 5/10 Baseline:  Goal status: INITIAL  2.  Pt cervical strength to increase 1/2 grade to allow pt to be able to read for 30 minutes without pain  Baseline:  Goal status: INITIAL   LONG TERM GOALS: Target date: 12/31/2021  Pt to be I in advanced HEP to decrease pain to no greater than a 2/10 Baseline:  Goal status: INITIAL  2.  Pt cervical strength to be increased one grade to allow pt to be able to read for an hour without pain  Baseline:  Goal status: INITIAL  3.  PT cervical ROM to be at 60 B to allow awareness of environment Baseline:  Goal status: INITIAL  PLAN: PT FREQUENCY: 2x/week  PT DURATION: 4 weeks  PLANNED INTERVENTIONS: Therapeutic exercises, Therapeutic activity,  Patient/Family education, Self Care, Joint mobilization, Dry Needling, Cryotherapy, Moist heat, and Manual therapy  PLAN FOR NEXT SESSION: Continue with cervical stretching and stability exercises to improve pain and function.   Rayetta Humphrey, [place] CLT [phone]  11/24/2021, [phone]

## 2021-11-30 ENCOUNTER — Ambulatory Visit (HOSPITAL_COMMUNITY): Payer: Medicare Other

## 2021-11-30 DIAGNOSIS — M47812 Spondylosis without myelopathy or radiculopathy, cervical region: Secondary | ICD-10-CM | POA: Diagnosis not present

## 2021-11-30 DIAGNOSIS — M542 Cervicalgia: Secondary | ICD-10-CM

## 2021-11-30 NOTE — Therapy (Signed)
OUTPATIENT PHYSICAL THERAPY CERVICAL TREATMENT   Patient Name: Eric Brady MRN: [id number] DOB:February 11, 1933, 88 y.o., male Today's Date: 11/30/2021   PT End of Session - 11/30/21 1518     Visit Number 2    Number of Visits 8    Date for PT Re-Evaluation 12/31/21    Authorization Type BCBS medicare    PT Start Time 1517    PT Stop Time 1559    PT Time Calculation (min) 42 min              Past Medical History:  Diagnosis Date   Anemia    Asthma    only as a child   Cancer ([place])    Prostate   Diabetes mellitus without complication ([place])    History of radiation therapy    Hyperlipidemia    Squamous cell carcinoma of skin 08/12/2019   in situ on right ear rim(CX3&EXC)   Past Surgical History:  Procedure Laterality Date   COLONOSCOPY N/A 09/12/2012   Procedure: COLONOSCOPY;  Surgeon: Rogene Houston, MD;  [facility];  Service: Endoscopy;  Laterality: N/A;  915   EYE SURGERY     LUMBAR LAMINECTOMY/DECOMPRESSION MICRODISCECTOMY Right 10/26/2020   Procedure: Microdiscectomy - Lumbar Three-Four Right;  Surgeon: Kary Kos, MD;  Location: [facility];  Service: Neurosurgery;  Laterality: Right;  3C   ORIF ANKLE FRACTURE Right    YAG LASER APPLICATION Left 0/86/5784   Procedure: YAG LASER APPLICATION;  Surgeon: Williams Che, MD;  Location[facility];  Service: Ophthalmology;  Laterality: Left;      PCP: Asencion Noble  REFERRING PROVIDER: Arther Abbott   REFERRING DIAG: M54.2 (ICD-10-CM) - Neck pain M47.812 (ICD-10-CM) - Spondylosis without myelopathy or radiculopathy, cervical region   THERAPY DIAG:  Cervicalgia  Rationale for Evaluation and Treatment Rehabilitation  ONSET DATE: chronic with acute exacerbation 5/23.   SUBJECTIVE:                                                                                                                                                                                                         SUBJECTIVE  STATEMENT: Patient reports no change in neck pain. His son Joneen Caraway is with him today. Tried to do exercises.  No pain at rest.  7/10 with movement.   PERTINENT HISTORY:  DM, lumbar surgery   PAIN:  Are you having pain? Yes: NPRS scale: 5/10 highest pain is an 8/10  Pain location: Left cervical Pain description: ache and sharp  Aggravating factors: moving his head  Relieving factors: tylenol  PRECAUTIONS: None  WEIGHT BEARING RESTRICTIONS No  FALLS:  Has patient fallen in last 6 months? No  LIVING ENVIRONMENT: Lives with: lives alone Lives in: House/apartment  OCCUPATION: retired   PLOF: Requires assistive device for independence  PATIENT GOALS Less pain and to be able to move his head better.   OBJECTIVE:   DIAGNOSTIC FINDINGS:  The cervical lordosis is completely obliterated the cervical spine is Nalgest straight he has mid level upper and lower lateral cervical degenerative changes in the uncovertebral joints   The degenerative changes start at 2133 and 4 there is subluxation of 3 on 4 4 and 5 have obliteration of the disc space with large anterior osteophytes 5 and 6 disc space is narrowed 6 and 7 is completely obliterated  Reading: Advanced cervical spondylosis   PATIENT SURVEYS:  FOTO 45   COGNITION: Overall cognitive status: Within functional limits for tasks assessed    POSTURE: rounded shoulders, forward head, and increased thoracic kyphosis  PALPATION: Noted mm spasms to B trap area    CERVICAL ROM:   Active ROM A/PROM (deg) eval  Flexion 40  Extension 40  Right lateral flexion 12  Left lateral flexion 15  Right rotation 50  Left rotation 45   (Blank rows = not tested) Cervical strength:  SB B 4/5 ; extension 3+/5  UPPER EXTREMITY ROM:  WFL   UPPER EXTREMITY MMT: WFL    TODAY'S TREATMENT:  11/30/21 Review of HEP and goals  Seated: STM to cervical paraspinals and upper traps to decrease pain and increase tissue mobility; no other  treatment performed during manual treatment Cervical retractions x 5 Cervical rotations x 5  Supine: Cervical retractions x 10 Cervical retraction with left rotation x 5 Gentle manual traction 5" pull x 8 Scapular retractions x 10      PATIENT EDUCATION:  Education details: The importance of posture in cervical health and HEP Person educated: Patient Education method: Explanation Education comprehension: verbalized understanding   [place]  11/30/21 : supine cervical retractions; retractions with rotations Scapular retraction Cervical retraction Cervical ROM (excursions)   ASSESSMENT:  CLINICAL IMPRESSION: Today's treatment started with review of HEP and goals.  Patient verbalizes understanding and agreement with set rehab goals. Patient with minimal change in subjective complaint so trial of STM to decrease pain and increase soft tissue mobility. Noted tightness right side structures; paraspinals > left. Patient continues with pain with seated cervical motion so put him in supine with less pain complaints.  Patient with difficulty coordinating cervical retractions; needs verbal and tactile cues to perform correctly. Able to rotate with less pain if cervical retraction and hold is performed prior to rotation.  Patient will benefit from continued skilled therapy interventions to address deficits and promote optimal function.     OBJECTIVE IMPAIRMENTS decreased ROM, decreased strength, hypomobility, increased fascial restrictions, postural dysfunction, and pain.   ACTIVITY LIMITATIONS carrying, lifting, and reach over head  PARTICIPATION LIMITATIONS: cleaning and laundry  PERSONAL FACTORS Age, Fitness, and Time since onset of injury/illness/exacerbation are also affecting patient's functional outcome.   REHAB POTENTIAL: Good  CLINICAL DECISION MAKING: Stable/uncomplicated  EVALUATION COMPLEXITY: Low   GOALS: Goals reviewed with patient?  No  SHORT TERM GOALS: Target date: 12/21/2021 Pt will start in a week   PT to be I in HEP to decrease pain to no greater than a 5/10 Baseline:  Goal status:in progress  2.  Pt cervical strength to increase 1/2 grade to allow pt to be able to  read for 30 minutes without pain  Baseline:  Goal status: in progress   LONG TERM GOALS: Target date: 12/31/2021  Pt to be I in advanced HEP to decrease pain to no greater than a 2/10 Baseline:  Goal status: in progress  2.  Pt cervical strength to be increased one grade to allow pt to be able to read for an hour without pain  Baseline:  Goal status: in progress  3.  PT cervical ROM to be at 60 B to allow awareness of environment Baseline:  Goal status: in progress  PLAN: PT FREQUENCY: 2x/week  PT DURATION: 4 weeks  PLANNED INTERVENTIONS: Therapeutic exercises, Therapeutic activity, Patient/Family education, Self Care, Joint mobilization, Dry Needling, Cryotherapy, Moist heat, and Manual therapy  PLAN FOR NEXT SESSION: Continue with cervical stretching and stability exercises to improve pain and function.   4:06 PM, 11/30/21 Vencent Hauschild Small Carrol Bondar MPT [facility] [place] [phone]

## 2021-12-07 ENCOUNTER — Encounter (HOSPITAL_COMMUNITY): Payer: Self-pay

## 2021-12-07 ENCOUNTER — Encounter (HOSPITAL_COMMUNITY)
Admission: RE | Admit: 2021-12-07 | Discharge: 2021-12-07 | Disposition: A | Discharge status: Home / Self Care | Payer: Medicare Other | Source: Ambulatory Visit | Attending: Nephrology | Admitting: Nephrology

## 2021-12-07 VITALS — BP 141/82 | HR 59 | Temp 97.9°F | Resp 18

## 2021-12-07 DIAGNOSIS — N184 Chronic kidney disease, stage 4 (severe): Secondary | ICD-10-CM | POA: Diagnosis not present

## 2021-12-07 LAB — POCT HEMOGLOBIN-HEMACUE: Hemoglobin: 13.7 g/dL (ref 13.0–17.0)

## 2021-12-07 MED ORDER — EPOETIN ALFA-EPBX 10000 UNIT/ML IJ SOLN: 10000.0000 [IU] | Freq: Once | INTRAMUSCULAR | Status: DC

## 2021-12-08 ENCOUNTER — Ambulatory Visit (HOSPITAL_COMMUNITY): Payer: Medicare Other | Attending: Orthopedic Surgery

## 2021-12-08 DIAGNOSIS — M542 Cervicalgia: Secondary | ICD-10-CM | POA: Diagnosis not present

## 2021-12-08 NOTE — Therapy (Signed)
OUTPATIENT PHYSICAL THERAPY CERVICAL TREATMENT   Patient Name: Eric Brady MRN: [id number] DOB:Oct 09, 1932, 88 y.o., male Today's Date: 12/08/2021   PT End of Session - 12/08/21 1304     Visit Number 3    Number of Visits 8    Date for PT Re-Evaluation 12/31/21    Authorization Type BCBS medicare    PT Start Time 1301    PT Stop Time 1343    PT Time Calculation (min) 42 min               Past Medical History:  Diagnosis Date   Anemia    Asthma    only as a child   Cancer ([place])    Prostate   Diabetes mellitus without complication ([place])    History of radiation therapy    Hyperlipidemia    Squamous cell carcinoma of skin 08/12/2019   in situ on right ear rim(CX3&EXC)   Past Surgical History:  Procedure Laterality Date   COLONOSCOPY N/A 09/12/2012   Procedure: COLONOSCOPY;  Surgeon: Rogene Houston, MD;  [facility];  Service: Endoscopy;  Laterality: N/A;  915   EYE SURGERY     LUMBAR LAMINECTOMY/DECOMPRESSION MICRODISCECTOMY Right 10/26/2020   Procedure: Microdiscectomy - Lumbar Three-Four Right;  Surgeon: Kary Kos, MD;  Location: [place];  Service: Neurosurgery;  Laterality: Right;  3C   ORIF ANKLE FRACTURE Right    YAG LASER APPLICATION Left 4/48/1856   Procedure: YAG LASER APPLICATION;  Surgeon: Williams Che, MD;  [facility]: [facility];  Service: Ophthalmology;  Laterality: Left;      PCP: Asencion Noble  REFERRING PROVIDER: Arther Abbott   REFERRING DIAG: M54.2 (ICD-10-CM) - Neck pain M47.812 (ICD-10-CM) - Spondylosis without myelopathy or radiculopathy, cervical region   THERAPY DIAG:  Cervicalgia  Rationale for Evaluation and Treatment Rehabilitation  ONSET DATE: chronic with acute exacerbation 5/23.   SUBJECTIVE:                                                                                                                                                                                                         SUBJECTIVE  STATEMENT: Patient reports a little less neck pain.  His son Joneen Caraway is with him today.  5/10 pain with left cervical rotation  PERTINENT HISTORY:  DM, lumbar surgery   PAIN:  Are you having pain? Yes: NPRS scale: 5/10 highest pain is an 8/10  Pain location: Left cervical Pain description: ache and sharp  Aggravating factors: moving his head  Relieving factors: tylenol   PRECAUTIONS: None  WEIGHT BEARING RESTRICTIONS No  FALLS:  Has patient fallen in last 6 months? No  LIVING ENVIRONMENT: Lives with: lives alone Lives in: House/apartment  OCCUPATION: retired   PLOF: Requires assistive device for independence  PATIENT GOALS Less pain and to be able to move his head better.   OBJECTIVE:   DIAGNOSTIC FINDINGS:  The cervical lordosis is completely obliterated the cervical spine is Nalgest straight he has mid level upper and lower lateral cervical degenerative changes in the uncovertebral joints   The degenerative changes start at 2133 and 4 there is subluxation of 3 on 4 4 and 5 have obliteration of the disc space with large anterior osteophytes 5 and 6 disc space is narrowed 6 and 7 is completely obliterated  Reading: Advanced cervical spondylosis   PATIENT SURVEYS:  FOTO 45   COGNITION: Overall cognitive status: Within functional limits for tasks assessed    POSTURE: rounded shoulders, forward head, and increased thoracic kyphosis  PALPATION: Noted mm spasms to B trap area    CERVICAL ROM:   Active ROM A/PROM (deg) eval AROM 12/08/21  Flexion 40   Extension 40   Right lateral flexion 12   Left lateral flexion 15   Right rotation 50 60  Left rotation 45 49   (Blank rows = not tested) Cervical strength:  SB B 4/5 ; extension 3+/5  UPPER EXTREMITY ROM:  WFL   UPPER EXTREMITY MMT: WFL    TODAY'S TREATMENT:  12/08/21 STM x 10 to cervical paraspinals and upper traps to decrease pain and increase tissue mobility; no other treatment performed during manual  treatment  Sitting: Cervical retractions x 10 Cervical retractions with extension x 10 Scapular retractions x 10  Supine: Cervical retractions x 10 Scapular retractions x 10 Manual cervical traction 5" pull x 10    11/30/21 Review of HEP and goals  Seated: STM to cervical paraspinals and upper traps to decrease pain and increase tissue mobility; no other treatment performed during manual treatment Cervical retractions x 5 Cervical rotations x 5  Supine: Cervical retractions x 10 Cervical retraction with left rotation x 5 Gentle manual traction 5" pull x 8 Scapular retractions x 10      PATIENT EDUCATION:  Education details: The importance of posture in cervical health and HEP Person educated: Patient Education method: Explanation Education comprehension: verbalized understanding   [place] 12/08/21 Access Code: [facility] URL: [email or web address] Date: 12/08/2021 Prepared by: [facility] - Rehab  Exercises - Seated Cervical Retraction  - 3 x daily - 7 x weekly - 1 sets - 10 reps - 3-5" hold - Seated Scapular Retraction  - 3 x daily - 7 x weekly - 1 sets - 10 reps - 3-5" hold - Supine Chin Tuck  - 2 x daily - 7 x weekly - 1 sets - 10 reps - Supine Scapular Retraction  - 2 x daily - 7 x weekly - 1 sets - 10 reps - Seated Cervical Retraction and Extension  - 2 x daily - 7 x weekly - 1 sets - 10 reps   11/30/21 : supine cervical retractions; retractions with rotations Scapular retraction Cervical retraction Cervical ROM (excursions)   ASSESSMENT:  CLINICAL IMPRESSION: Today's treatment focused on cervical mobility and postural strengthening.  Patient still with some difficulty with cervical retraction technique; needs verbal and tactile cueing. Mild improvement noted with AROM cervical rotation with report of declining pain level.  Added cervical extensions today; updated HEP.    Patient will benefit from continued  skilled therapy  interventions to address deficits and promote optimal function.     OBJECTIVE IMPAIRMENTS decreased ROM, decreased strength, hypomobility, increased fascial restrictions, postural dysfunction, and pain.   ACTIVITY LIMITATIONS carrying, lifting, and reach over head  PARTICIPATION LIMITATIONS: cleaning and laundry  PERSONAL FACTORS Age, Fitness, and Time since onset of injury/illness/exacerbation are also affecting patient's functional outcome.   REHAB POTENTIAL: Good  CLINICAL DECISION MAKING: Stable/uncomplicated  EVALUATION COMPLEXITY: Low   GOALS: Goals reviewed with patient? No  SHORT TERM GOALS: Target date: 12/29/2021 Pt will start in a week   PT to be I in HEP to decrease pain to no greater than a 5/10 Baseline:  Goal status:in progress  2.  Pt cervical strength to increase 1/2 grade to allow pt to be able to read for 30 minutes without pain  Baseline:  Goal status: in progress   LONG TERM GOALS: Target date: 12/31/2021  Pt to be I in advanced HEP to decrease pain to no greater than a 2/10 Baseline:  Goal status: in progress  2.  Pt cervical strength to be increased one grade to allow pt to be able to read for an hour without pain  Baseline:  Goal status: in progress  3.  PT cervical ROM to be at 60 B to allow awareness of environment Baseline:  Goal status: in progress  PLAN: PT FREQUENCY: 2x/week  PT DURATION: 4 weeks  PLANNED INTERVENTIONS: Therapeutic exercises, Therapeutic activity, Patient/Family education, Self Care, Joint mobilization, Dry Needling, Cryotherapy, Moist heat, and Manual therapy  PLAN FOR NEXT SESSION: Continue with cervical stretching and stability exercises to improve pain and function.   1:49 PM, 12/08/21 Ravinder Hofland Small Romina Divirgilio MPT [place] [facility] [place] [phone]

## 2021-12-10 ENCOUNTER — Encounter (HOSPITAL_COMMUNITY): Payer: Self-pay | Admitting: Physical Therapy

## 2021-12-10 ENCOUNTER — Ambulatory Visit (HOSPITAL_COMMUNITY): Payer: Medicare Other | Admitting: Physical Therapy

## 2021-12-10 DIAGNOSIS — M542 Cervicalgia: Secondary | ICD-10-CM

## 2021-12-10 NOTE — Therapy (Signed)
OUTPATIENT PHYSICAL THERAPY CERVICAL TREATMENT   Patient Name: Eric Brady MRN: [id number] DOB:1932/07/15, 88 y.o., male Today's Date: 12/10/2021   PT End of Session - 12/10/21 0831     Visit Number 4    Number of Visits 8    Date for PT Re-Evaluation 12/31/21    Authorization Type BCBS medicare    PT Start Time [phone]    PT Stop Time 0859    PT Time Calculation (min) 45 min               Past Medical History:  Diagnosis Date   Anemia    Asthma    only as a child   Cancer ([place])    Prostate   Diabetes mellitus without complication ([place])    History of radiation therapy    Hyperlipidemia    Squamous cell carcinoma of skin 08/12/2019   in situ on right ear rim(CX3&EXC)   Past Surgical History:  Procedure Laterality Date   COLONOSCOPY N/A 09/12/2012   Procedure: COLONOSCOPY;  Surgeon: Rogene Houston, MD;  [facility];  Service: Endoscopy;  Laterality: N/A;  915   EYE SURGERY     LUMBAR LAMINECTOMY/DECOMPRESSION MICRODISCECTOMY Right 10/26/2020   Procedure: Microdiscectomy - Lumbar Three-Four Right;  Surgeon: Kary Kos, MD;  Location: [place];  Service: Neurosurgery;  Laterality: Right;  3C   ORIF ANKLE FRACTURE Right    YAG LASER APPLICATION Left 4/40/3474   Procedure: YAG LASER APPLICATION;  Surgeon: Williams Che, MD;  [facility];  Service: Ophthalmology;  Laterality: Left;      PCP: Asencion Noble  REFERRING PROVIDER: Arther Abbott   REFERRING DIAG: M54.2 (ICD-10-CM) - Neck pain M47.812 (ICD-10-CM) - Spondylosis without myelopathy or radiculopathy, cervical region   THERAPY DIAG:  Cervicalgia  Rationale for Evaluation and Treatment Rehabilitation  ONSET DATE: chronic with acute exacerbation 5/23.   SUBJECTIVE:                                                                                                                                                                                                         SUBJECTIVE  STATEMENT: Slept well but pain increased throughout the morning. Accompanied by daughter today who would like to consider cortisone injections from orthopedist.  PERTINENT HISTORY:  DM, lumbar surgery   PAIN:  Are you having pain? Yes: NPRS scale: 6/10 highest pain is an 8/10  Pain location: Left cervical Pain description: ache and sharp  Aggravating factors: moving his head  Relieving factors: tylenol   PRECAUTIONS: None  WEIGHT  BEARING RESTRICTIONS No  FALLS:  Has patient fallen in last 6 months? No  LIVING ENVIRONMENT: Lives with: lives alone Lives in: House/apartment  OCCUPATION: retired   PLOF: Requires assistive device for independence  PATIENT GOALS Less pain and to be able to move his head better.   OBJECTIVE:   DIAGNOSTIC FINDINGS:  The cervical lordosis is completely obliterated the cervical spine is Nalgest straight he has mid level upper and lower lateral cervical degenerative changes in the uncovertebral joints   The degenerative changes start at 2133 and 4 there is subluxation of 3 on 4 4 and 5 have obliteration of the disc space with large anterior osteophytes 5 and 6 disc space is narrowed 6 and 7 is completely obliterated  Reading: Advanced cervical spondylosis   PATIENT SURVEYS:  FOTO 45   COGNITION: Overall cognitive status: Within functional limits for tasks assessed    POSTURE: rounded shoulders, forward head, and increased thoracic kyphosis  PALPATION: Noted mm spasms to B trap area    CERVICAL ROM:   Active ROM A/PROM (deg) eval AROM 12/08/21  Flexion 40   Extension 40   Right lateral flexion 12   Left lateral flexion 15   Right rotation 50 60  Left rotation 45 49   (Blank rows = not tested) Cervical strength:  SB B 4/5 ; extension 3+/5  UPPER EXTREMITY ROM:  WFL   UPPER EXTREMITY MMT: [facility]    TODAY'S TREATMENT:  12/10/21 Manual therapy to cervical spine PAIVM c7-c3 PA and lateral glides grade II-III 30 ea segment STM x 5  to cervical paraspinals and upper traps to decrease pain and increase tissue mobility; no other treatment performed during manual treatment  Sitting: Cervical retractions x 10 Cervical retractions with extension x 10 Scapular retraction with YTB BIL ER x 10 Scapular retraction with YTB Rows x10 Thoracic extension x5   Supine: Cervical retractions x 10 Scapular retractions x 10 Scapular retraction with YTB horizontal abduction x10 Cervical rotation with gentle overpressure 5x5" ea   12/08/21 STM x 10 to cervical paraspinals and upper traps to decrease pain and increase tissue mobility; no other treatment performed during manual treatment  Sitting: Cervical retractions x 10 Cervical retractions with extension x 10 Scapular retractions x 10  Supine: Cervical retractions x 10 Scapular retractions x 10 Manual cervical traction 5" pull x 10    11/30/21 Review of HEP and goals  Seated: STM to cervical paraspinals and upper traps to decrease pain and increase tissue mobility; no other treatment performed during manual treatment Cervical retractions x 5 Cervical rotations x 5  Supine: Cervical retractions x 10 Cervical retraction with left rotation x 5 Gentle manual traction 5" pull x 8 Scapular retractions x 10      PATIENT EDUCATION:  Education details: The importance of posture in cervical health and HEP Person educated: Patient Education method: Explanation Education comprehension: verbalized understanding   [place] 12/08/21 Access Code: [facility] URL: [email or web address] Date: 12/08/2021 Prepared by: [facility] - Rehab  Exercises - Seated Cervical Retraction  - 3 x daily - 7 x weekly - 1 sets - 10 reps - 3-5" hold - Seated Scapular Retraction  - 3 x daily - 7 x weekly - 1 sets - 10 reps - 3-5" hold - Supine Chin Tuck  - 2 x daily - 7 x weekly - 1 sets - 10 reps - Supine Scapular Retraction  - 2 x daily - 7 x weekly - 1 sets - 10  reps -  Seated Cervical Retraction and Extension  - 2 x daily - 7 x weekly - 1 sets - 10 reps   11/30/21 : supine cervical retractions; retractions with rotations Scapular retraction Cervical retraction Cervical ROM (excursions)   ASSESSMENT:  CLINICAL IMPRESSION: Tolerated manual work well, shortly tolerated progression with some seated based exercises however neck became quickly fatigued and more painful. Unable to hold head level for extended periods of time. Extensive education for exercises in neutral position to remain with minimal pain if that requires supine for now.    OBJECTIVE IMPAIRMENTS decreased ROM, decreased strength, hypomobility, increased fascial restrictions, postural dysfunction, and pain.   ACTIVITY LIMITATIONS carrying, lifting, and reach over head  PARTICIPATION LIMITATIONS: cleaning and laundry  PERSONAL FACTORS Age, Fitness, and Time since onset of injury/illness/exacerbation are also affecting patient's functional outcome.   REHAB POTENTIAL: Good  CLINICAL DECISION MAKING: Stable/uncomplicated  EVALUATION COMPLEXITY: Low   GOALS: Goals reviewed with patient? No  SHORT TERM GOALS: Target date: 12/31/2021 Pt will start in a week   PT to be I in HEP to decrease pain to no greater than a 5/10 Baseline:  Goal status:in progress  2.  Pt cervical strength to increase 1/2 grade to allow pt to be able to read for 30 minutes without pain  Baseline:  Goal status: in progress   LONG TERM GOALS: Target date: 12/31/2021  Pt to be I in advanced HEP to decrease pain to no greater than a 2/10 Baseline:  Goal status: in progress  2.  Pt cervical strength to be increased one grade to allow pt to be able to read for an hour without pain  Baseline:  Goal status: in progress  3.  PT cervical ROM to be at 60 B to allow awareness of environment Baseline:  Goal status: in progress  PLAN: PT FREQUENCY: 2x/week  PT DURATION: 4 weeks  PLANNED INTERVENTIONS:  Therapeutic exercises, Therapeutic activity, Patient/Family education, Self Care, Joint mobilization, Dry Needling, Cryotherapy, Moist heat, and Manual therapy  PLAN FOR NEXT SESSION: Continue with cervical stretching and stability exercises to improve pain and function. Consider allowing soft collar use while performing upright exercises if unable to progress from supine.  9:05 AM, 12/10/21 Candie Mile, PT, DPT Physical Therapist [facility] [place] [facility]

## 2021-12-15 ENCOUNTER — Ambulatory Visit (HOSPITAL_COMMUNITY): Payer: Medicare Other | Admitting: Physical Therapy

## 2021-12-15 DIAGNOSIS — M542 Cervicalgia: Secondary | ICD-10-CM

## 2021-12-15 NOTE — Therapy (Signed)
OUTPATIENT PHYSICAL THERAPY CERVICAL TREATMENT   Patient Name: Eric Brady MRN: [id number] DOB:1932-08-23, 88 y.o., male Today's Date: 12/15/2021   PT End of Session - 12/15/21 1514     Visit Number 5    Number of Visits 8    Date for PT Re-Evaluation 12/31/21    Authorization Type BCBS medicare    PT Start Time 1436    PT Stop Time 1514    PT Time Calculation (min) 38 min    Activity Tolerance Other (comment)   dizziness   Behavior During Therapy WFL for tasks assessed/performed                Past Medical History:  Diagnosis Date   Anemia    Asthma    only as a child   Cancer ([place])    Prostate   Diabetes mellitus without complication ([place])    History of radiation therapy    Hyperlipidemia    Squamous cell carcinoma of skin 08/12/2019   in situ on right ear rim(CX3&EXC)   Past Surgical History:  Procedure Laterality Date   COLONOSCOPY N/A 09/12/2012   Procedure: COLONOSCOPY;  Surgeon: Rogene Houston, MD;  [facility];  Service: Endoscopy;  Laterality: N/A;  915   EYE SURGERY     LUMBAR LAMINECTOMY/DECOMPRESSION MICRODISCECTOMY Right 10/26/2020   Procedure: Microdiscectomy - Lumbar Three-Four Right;  Surgeon: Kary Kos, MD;  Location: [place];  Service: Neurosurgery;  Laterality: Right;  3C   ORIF ANKLE FRACTURE Right    YAG LASER APPLICATION Left 5/85/2778   Procedure: YAG LASER APPLICATION;  Surgeon: Williams Che, MD;  [facility];  Service: Ophthalmology;  Laterality: Left;      PCP: Asencion Noble  REFERRING PROVIDER: Arther Abbott   REFERRING DIAG: M54.2 (ICD-10-CM) - Neck pain M47.812 (ICD-10-CM) - Spondylosis without myelopathy or radiculopathy, cervical region   THERAPY DIAG:  Cervicalgia  Rationale for Evaluation and Treatment Rehabilitation  ONSET DATE: chronic with acute exacerbation 5/23.   SUBJECTIVE:                                                                                                                                                                                                          SUBJECTIVE STATEMENT: Pt states that his pain is better than when he first started therapy .  PERTINENT HISTORY:  DM, lumbar surgery   PAIN:  Are you having pain? Yes: NPRS scale: 5/10 highest pain is an 5/10  Pain location: Left cervical Pain description: ache and sharp  Aggravating factors:  moving his head  Relieving factors: tylenol   PRECAUTIONS: None  WEIGHT BEARING RESTRICTIONS No  FALLS:  Has patient fallen in last 6 months? No  LIVING ENVIRONMENT: Lives with: lives alone Lives in: House/apartment  OCCUPATION: retired   PLOF: Requires assistive device for independence  PATIENT GOALS Less pain and to be able to move his head better.   OBJECTIVE:   DIAGNOSTIC FINDINGS:  The cervical lordosis is completely obliterated the cervical spine is Nalgest straight he has mid level upper and lower lateral cervical degenerative changes in the uncovertebral joints   The degenerative changes start at 2133 and 4 there is subluxation of 3 on 4 4 and 5 have obliteration of the disc space with large anterior osteophytes 5 and 6 disc space is narrowed 6 and 7 is completely obliterated  Reading: Advanced cervical spondylosis   PATIENT SURVEYS:  FOTO 45   COGNITION: Overall cognitive status: Within functional limits for tasks assessed    POSTURE: rounded shoulders, forward head, and increased thoracic kyphosis  PALPATION: Noted mm spasms to B trap area    CERVICAL ROM:   Active ROM A/PROM (deg) eval AROM 12/08/21  Flexion 40   Extension 40   Right lateral flexion 12   Left lateral flexion 15   Right rotation 50 60  Left rotation 45 49   (Blank rows = not tested) Cervical strength:  SB B 4/5 ; extension 3+/5  UPPER EXTREMITY ROM:  WFL   UPPER EXTREMITY MMT: WFL    TODAY'S TREATMENT:  12/15/21: Manual therapy to cervical spine jt mobs and lateral glides grade II-III  STM x 5  to cervical paraspinals and upper traps to decrease pain and increase tissue mobility; no other treatment performed during manual treatment Supine:  cervical rotation x 10; cervical isometrics x 5 reps, cervical and scapular retraction x 10  Sitting:  cervical excursions x3 reps              W back x 10              Backward shoulder circles x 10              X to V x 10               Scapular retraction x 5   12/10/21 Manual therapy to cervical spine PAIVM c7-c3 PA and lateral glides grade II-III 30 ea segment STM x 5 to cervical paraspinals and upper traps to decrease pain and increase tissue mobility; no other treatment performed during manual treatment  Sitting: Cervical retractions x 10 Cervical retractions with extension x 10 Scapular retraction with YTB BIL ER x 10 Scapular retraction with YTB Rows x10 Thoracic extension x5   Supine: Cervical retractions x 10 Scapular retractions x 10 Scapular retraction with YTB horizontal abduction x10 Cervical rotation with gentle overpressure 5x5" ea      PATIENT EDUCATION:  Education details: The importance of posture in cervical health and HEP Person educated: Patient Education method: Explanation Education comprehension: verbalized understanding   [place] 12/08/21 Access Code: [facility] URL: [email or web address] Date: 12/08/2021 Prepared by: [facility] - Rehab  Exercises - Seated Cervical Retraction  - 3 x daily - 7 x weekly - 1 sets - 10 reps - 3-5" hold - Seated Scapular Retraction  - 3 x daily - 7 x weekly - 1 sets - 10 reps - 3-5" hold - Supine Chin Tuck  - 2 x daily - 7 x weekly - 1  sets - 10 reps - Supine Scapular Retraction  - 2 x daily - 7 x weekly - 1 sets - 10 reps - Seated Cervical Retraction and Extension  - 2 x daily - 7 x weekly - 1 sets - 10 reps    ASSESSMENT:  CLINICAL IMPRESSION: Pt has noted decreased Rt side bend and Rt rotation.  Both pt pain and ROM are improving. PT  has increased dizziness when completing sitting ROM exercises limiting sitting exercises.  PT will continue to benefit from skilled PT to address his lack of ROM as well as his pain.    OBJECTIVE IMPAIRMENTS decreased ROM, decreased strength, hypomobility, increased fascial restrictions, postural dysfunction, and pain.   ACTIVITY LIMITATIONS carrying, lifting, and reach over head  PARTICIPATION LIMITATIONS: cleaning and laundry  PERSONAL FACTORS Age, Fitness, and Time since onset of injury/illness/exacerbation are also affecting patient's functional outcome.   REHAB POTENTIAL: Good  CLINICAL DECISION MAKING: Stable/uncomplicated  EVALUATION COMPLEXITY: Low   GOALS: Goals reviewed with patient? No  SHORT TERM GOALS: Target date: 01/05/2022 Pt will start in a week   PT to be I in HEP to decrease pain to no greater than a 5/10 Baseline:  Goal status:in progress  2.  Pt cervical strength to increase 1/2 grade to allow pt to be able to read for 30 minutes without pain  Baseline:  Goal status: in progress   LONG TERM GOALS: Target date: 12/31/2021  Pt to be I in advanced HEP to decrease pain to no greater than a 2/10 Baseline:  Goal status: in progress  2.  Pt cervical strength to be increased one grade to allow pt to be able to read for an hour without pain  Baseline:  Goal status: in progress  3.  PT cervical ROM to be at 60 B to allow awareness of environment Baseline:  Goal status: in progress  PLAN: PT FREQUENCY: 2x/week  PT DURATION: 4 weeks  PLANNED INTERVENTIONS: Therapeutic exercises, Therapeutic activity, Patient/Family education, Self Care, Joint mobilization, Dry Needling, Cryotherapy, Moist heat, and Manual therapy  PLAN FOR NEXT SESSION: Continue with cervical stretching and stability exercises to improve pain and function. Consider allowing soft collar use while performing upright exercises if unable to progress from supine.  Rayetta Humphrey, PT  CLT [phone]  15:14

## 2021-12-17 ENCOUNTER — Encounter (HOSPITAL_COMMUNITY): Payer: Self-pay | Admitting: Physical Therapy

## 2021-12-17 ENCOUNTER — Ambulatory Visit (HOSPITAL_COMMUNITY): Payer: Medicare Other | Admitting: Physical Therapy

## 2021-12-17 DIAGNOSIS — M542 Cervicalgia: Secondary | ICD-10-CM | POA: Diagnosis not present

## 2021-12-17 NOTE — Therapy (Signed)
OUTPATIENT PHYSICAL THERAPY CERVICAL TREATMENT   Patient Name: Eric Brady MRN: [id number] DOB:1932/10/27, 88 y.o., male Today's Date: 12/17/2021   PT End of Session - 12/17/21 1428     Visit Number 6    Number of Visits 8    Date for PT Re-Evaluation 12/31/21    Authorization Type [employer]BS medicare    PT Start Time 1347    PT Stop Time 1428    PT Time Calculation (min) 41 min    Activity Tolerance Other (comment)   dizziness   Behavior During Therapy WFL for tasks assessed/performed                 Past Medical History:  Diagnosis Date   Anemia    Asthma    only as a child   Cancer ([place])    Prostate   Diabetes mellitus without complication ([place])    History of radiation therapy    Hyperlipidemia    Squamous cell carcinoma of skin 08/12/2019   in situ on right ear rim(CX3&EXC)   Past Surgical History:  Procedure Laterality Date   COLONOSCOPY N/A 09/12/2012   Procedure: COLONOSCOPY;  Surgeon: Rogene Houston, MD;  [facility];  Service: Endoscopy;  Laterality: N/A;  915   EYE SURGERY     LUMBAR LAMINECTOMY/DECOMPRESSION MICRODISCECTOMY Right 10/26/2020   Procedure: Microdiscectomy - Lumbar Three-Four Right;  Surgeon: Kary Kos, MD;  Location: [place];  Service: Neurosurgery;  Laterality: Right;  3C   ORIF ANKLE FRACTURE Right    YAG LASER APPLICATION Left 07/11/8117   Procedure: YAG LASER APPLICATION;  Surgeon: Williams Che, MD;  Location: [facility];  Service: Ophthalmology;  Laterality: Left;      PCP: Asencion Noble  REFERRING PROVIDER: Arther Abbott   REFERRING DIAG: M54.2 (ICD-10-CM) - Neck pain M47.812 (ICD-10-CM) - Spondylosis without myelopathy or radiculopathy, cervical region   THERAPY DIAG:  Cervicalgia  Rationale for Evaluation and Treatment Rehabilitation  ONSET DATE: chronic with acute exacerbation 5/23.   SUBJECTIVE:                                                                                                                                                                                                          SUBJECTIVE STATEMENT: Pt states that his pain is better than when he first started therapy .  PERTINENT HISTORY:  DM, lumbar surgery   PAIN:  Are you having pain? Yes: NPRS scale: 5/10 highest pain is an 5/10  Pain location: Left cervical Pain description: ache and sharp  Aggravating  factors: moving his head  Relieving factors: tylenol   PRECAUTIONS: None  WEIGHT BEARING RESTRICTIONS No  FALLS:  Has patient fallen in last 6 months? No  LIVING ENVIRONMENT: Lives with: lives alone Lives in: House/apartment  OCCUPATION: retired   PLOF: Requires assistive device for independence  PATIENT GOALS Less pain and to be able to move his head better.   OBJECTIVE:   DIAGNOSTIC FINDINGS:  The cervical lordosis is completely obliterated the cervical spine is Nalgest straight he has mid level upper and lower lateral cervical degenerative changes in the uncovertebral joints   The degenerative changes start at 2133 and 4 there is subluxation of 3 on 4 4 and 5 have obliteration of the disc space with large anterior osteophytes 5 and 6 disc space is narrowed 6 and 7 is completely obliterated  Reading: Advanced cervical spondylosis   PATIENT SURVEYS:  FOTO 45   COGNITION: Overall cognitive status: Within functional limits for tasks assessed    POSTURE: rounded shoulders, forward head, and increased thoracic kyphosis  PALPATION: Noted mm spasms to B trap area    CERVICAL ROM:   Active ROM A/PROM (deg) eval AROM 12/08/21  Flexion 40   Extension 40   Right lateral flexion 12   Left lateral flexion 15   Right rotation 50 60  Left rotation 45 49   (Blank rows = not tested) Cervical strength:  SB B 4/5 ; extension 3+/5  UPPER EXTREMITY ROM:  WFL   UPPER EXTREMITY MMT: [facility]    TODAY'S TREATMENT:  12/17/2021 Manual therapy to cervical spine jt mobs and lateral glides grade II-III  STM  x 5 to cervical paraspinals and upper traps to decrease pain and increase tissue mobility; no other treatment performed during manual treatment Supine:  cervical rotation x 10;, cervical and scapular retraction x 10 , B shoulder flexion and chest press x 10  Sitting:  tall sitting posture x 10               W back x 10              Backward shoulder circles x 10              X to V x 5              Scapular retraction x 5   12/15/21: Manual therapy to cervical spine jt mobs and lateral glides grade II-III  STM x 5 to cervical paraspinals and upper traps to decrease pain and increase tissue mobility; no other treatment performed during manual treatment Supine:  cervical rotation x 10; cervical isometrics x 5 reps, cervical and scapular retraction x 10  Sitting:  cervical excursions x3 reps              W back x 10              Backward shoulder circles x 10              X to V x 10               Scapular retraction x 5   12/10/21 Manual therapy to cervical spine PAIVM c7-c3 PA and lateral glides grade II-III 30 ea segment STM x 5 to cervical paraspinals and upper traps to decrease pain and increase tissue mobility; no other treatment performed during manual treatment  Sitting: Cervical retractions x 10 Cervical retractions with extension x 10 Scapular retraction with YTB BIL ER x 10 Scapular retraction  with YTB Rows x10 Thoracic extension x5   Supine: Cervical retractions x 10 Scapular retractions x 10 Scapular retraction with YTB horizontal abduction x10 Cervical rotation with gentle overpressure 5x5" ea      PATIENT EDUCATION:  Education details: The importance of posture in cervical health and HEP Person educated: Patient Education method: Explanation Education comprehension: verbalized understanding   [place] 12/08/21 Access Code: [facility] URL: [email or web address] Date: 12/08/2021 Prepared by: [facility] - [facility]hab  Exercises - Seated  Cervical Retraction  - 3 x daily - 7 x weekly - 1 sets - 10 reps - 3-5" hold - Seated Scapular Retraction  - 3 x daily - 7 x weekly - 1 sets - 10 reps - 3-5" hold - Supine Chin Tuck  - 2 x daily - 7 x weekly - 1 sets - 10 reps - Supine Scapular Retraction  - 2 x daily - 7 x weekly - 1 sets - 10 reps - Seated Cervical Retraction and Extension  - 2 x daily - 7 x weekly - 1 sets - 10 reps    ASSESSMENT:  CLINICAL IMPRESSION: Pt continues to complain of dizziness therefore cervical extension was avoided with this treatment.  Pt has MD appointment next week, therapist recommended speaking to the MD about possibly checking arterial flow in the cervical area.      OBJECTIVE IMPAIRMENTS decreased ROM, decreased strength, hypomobility, increased fascial restrictions, postural dysfunction, and pain.   ACTIVITY LIMITATIONS carrying, lifting, and reach over head  PARTICIPATION LIMITATIONS: cleaning and laundry  PERSONAL FACTORS Age, Fitness, and Time since onset of injury/illness/exacerbation are also affecting patient's functional outcome.   REHAB POTENTIAL: Good  CLINICAL DECISION MAKING: Stable/uncomplicated  EVALUATION COMPLEXITY: Low   GOALS: Goals reviewed with patient? No  SHORT TERM GOALS: Target date: 01/07/2022 Pt will start in a week   PT to be I in HEP to decrease pain to no greater than a 5/10 Baseline:  Goal status:in progress  2.  Pt cervical strength to increase 1/2 grade to allow pt to be able to read for 30 minutes without pain  Baseline:  Goal status: in progress   LONG TERM GOALS: Target date: 12/31/2021  Pt to be I in advanced HEP to decrease pain to no greater than a 2/10 Baseline:  Goal status: in progress  2.  Pt cervical strength to be increased one grade to allow pt to be able to read for an hour without pain  Baseline:  Goal status: in progress  3.  PT cervical ROM to be at 60 B to allow awareness of environment Baseline:  Goal status: in  progress  PLAN: PT FREQUENCY: 2x/week  PT DURATION: 4 weeks  PLANNED INTERVENTIONS: Therapeutic exercises, Therapeutic activity, Patient/Family education, Self Care, Joint mobilization, Dry Needling, Cryotherapy, Moist heat, and Manual therapy  PLAN FOR NEXT SESSION: Continue with cervical stretching and stability exercises to improve pain and function. Consider allowing soft collar use while performing upright exercises if unable to progress from supine.  Rayetta Humphrey, [place] [phone]  [phone]

## 2021-12-21 ENCOUNTER — Encounter (HOSPITAL_COMMUNITY)
Admission: RE | Admit: 2021-12-21 | Discharge: 2021-12-21 | Disposition: A | Discharge status: Home / Self Care | Payer: Medicare Other | Source: Ambulatory Visit | Attending: Nephrology | Admitting: Nephrology

## 2021-12-21 ENCOUNTER — Encounter (HOSPITAL_COMMUNITY): Payer: Self-pay

## 2021-12-21 VITALS — BP 144/77 | HR 54 | Temp 97.8°F | Resp 18 | Ht 70.0 in | Wt 182.0 lb

## 2021-12-21 DIAGNOSIS — N184 Chronic kidney disease, stage 4 (severe): Secondary | ICD-10-CM

## 2021-12-21 LAB — CBC WITH DIFFERENTIAL/PLATELET
Abs Immature Granulocytes: 0.01 10*3/uL (ref 0.00–0.07)
Basophils Absolute: 0.1 10*3/uL (ref 0.0–0.1)
Basophils Relative: 1 %
Eosinophils Absolute: 0.2 10*3/uL (ref 0.0–0.5)
Eosinophils Relative: 3 %
HCT: 41.4 % (ref 39.0–52.0)
Hemoglobin: 12.7 g/dL — ABNORMAL LOW (ref 13.0–17.0)
Immature Granulocytes: 0 %
Lymphocytes Relative: 31 %
Lymphs Abs: 2.2 10*3/uL (ref 0.7–4.0)
MCH: 28.4 pg (ref 26.0–34.0)
MCHC: 30.7 g/dL (ref 30.0–36.0)
MCV: 92.6 fL (ref 80.0–100.0)
Monocytes Absolute: 0.5 10*3/uL (ref 0.1–1.0)
Monocytes Relative: 7 %
Neutro Abs: 4.2 10*3/uL (ref 1.7–7.7)
Neutrophils Relative %: 58 %
Platelets: 180 10*3/uL (ref 150–400)
RBC: 4.47 MIL/uL (ref 4.22–5.81)
RDW: 14.1 % (ref 11.5–15.5)
WBC: 7.3 10*3/uL (ref 4.0–10.5)
nRBC: 0 % (ref 0.0–0.2)

## 2021-12-21 LAB — FERRITIN: Ferritin: 89 ng/mL (ref 24–336)

## 2021-12-21 LAB — IRON AND TIBC
Iron: 85 ug/dL (ref 45–182)
Saturation Ratios: 26 % (ref 17.9–39.5)
TIBC: 324 ug/dL (ref 250–450)
UIBC: 239 ug/dL

## 2021-12-21 LAB — POCT HEMOGLOBIN-HEMACUE: Hemoglobin: 13.4 g/dL (ref 13.0–17.0)

## 2021-12-21 MED ORDER — EPOETIN ALFA-EPBX 10000 UNIT/ML IJ SOLN: 10000.0000 [IU] | Freq: Once | INTRAMUSCULAR | Status: DC

## 2021-12-22 ENCOUNTER — Ambulatory Visit (HOSPITAL_COMMUNITY): Payer: Medicare Other | Admitting: Physical Therapy

## 2021-12-22 DIAGNOSIS — E1129 Type 2 diabetes mellitus with other diabetic kidney complication: Secondary | ICD-10-CM | POA: Diagnosis not present

## 2021-12-22 DIAGNOSIS — N184 Chronic kidney disease, stage 4 (severe): Secondary | ICD-10-CM | POA: Diagnosis not present

## 2021-12-22 DIAGNOSIS — M542 Cervicalgia: Secondary | ICD-10-CM | POA: Diagnosis not present

## 2021-12-22 NOTE — Therapy (Addendum)
OUTPATIENT PHYSICAL THERAPY CERVICAL TREATMENT   Patient Name: Eric Brady MRN: [id number] DOB:April 29, 1932, 88 y.o., male Today's Date: 12/22/2021   PT End of Session - 12/22/21 1654     Visit Number 7    Number of Visits 8    Date for PT Re-Evaluation 12/31/21    Authorization Type [employer] medicare    PT Start Time 1523    PT Stop Time 1604    PT Time Calculation (min) 41 min    Activity Tolerance Other (comment)   dizziness   Behavior During Therapy WFL for tasks assessed/performed                  Past Medical History:  Diagnosis Date   Anemia    Asthma    only as a child   Cancer ([place])    Prostate   Diabetes mellitus without complication ([place])    History of radiation therapy    Hyperlipidemia    Squamous cell carcinoma of skin 08/12/2019   in situ on right ear rim(CX3&EXC)   Past Surgical History:  Procedure Laterality Date   COLONOSCOPY N/A 09/12/2012   Procedure: COLONOSCOPY;  Surgeon: Rogene Houston, MD;  [facility];  Service: Endoscopy;  Laterality: N/A;  915   EYE SURGERY     LUMBAR LAMINECTOMY/DECOMPRESSION MICRODISCECTOMY Right 10/26/2020   Procedure: Microdiscectomy - Lumbar Three-Four Right;  Surgeon: Kary Kos, MD;  Location: [place];  Service: Neurosurgery;  Laterality: Right;  3C   ORIF ANKLE FRACTURE Right    YAG LASER APPLICATION Left 7/78/2423   Procedure: YAG LASER APPLICATION;  Surgeon: Williams Che, MD;  Location[facility];  Service: Ophthalmology;  Laterality: Left;      PCP: Asencion Noble  REFERRING PROVIDER: Arther Abbott   REFERRING DIAG: M54.2 (ICD-10-CM) - Neck pain M47.812 (ICD-10-CM) - Spondylosis without myelopathy or radiculopathy, cervical region   THERAPY DIAG:  Cervicalgia  Rationale for Evaluation and Treatment Rehabilitation  ONSET DATE: chronic with acute exacerbation 5/23.   SUBJECTIVE:                                                                                                                                                                                                          SUBJECTIVE STATEMENT: Pt states that he feels the best that he has in a long time.  PERTINENT HISTORY:  DM, lumbar surgery   PAIN:  Are you having pain? Yes: NPRS scale: 5/10 highest pain is an 5/10  Pain location: Left cervical Pain description: ache and sharp  Aggravating factors: moving his head  Relieving factors: tylenol   PRECAUTIONS: None  WEIGHT BEARING RESTRICTIONS No  FALLS:  Has patient fallen in last 6 months? No  LIVING ENVIRONMENT: Lives with: lives alone Lives in: House/apartment  OCCUPATION: retired   PLOF: Requires assistive device for independence  PATIENT GOALS Less pain and to be able to move his head better.   OBJECTIVE:   DIAGNOSTIC FINDINGS:  The cervical lordosis is completely obliterated the cervical spine is Nalgest straight he has mid level upper and lower lateral cervical degenerative changes in the uncovertebral joints   The degenerative changes start at 2133 and 4 there is subluxation of 3 on 4 4 and 5 have obliteration of the disc space with large anterior osteophytes 5 and 6 disc space is narrowed 6 and 7 is completely obliterated  Reading: Advanced cervical spondylosis   PATIENT SURVEYS:  FOTO 45   COGNITION: Overall cognitive status: Within functional limits for tasks assessed    POSTURE: rounded shoulders, forward head, and increased thoracic kyphosis  PALPATION: Noted mm spasms to B trap area    CERVICAL ROM:   Active ROM A/PROM (deg) eval AROM 12/08/21 AROM 12/22/21  Flexion 40    Extension 40    Right lateral flexion 12  19  Left lateral flexion 15  23  Right rotation 50 60 60  Left rotation 45 49 60   (Blank rows = not tested) Cervical strength:  SB B 4/5 ; extension 3+/5  UPPER EXTREMITY ROM:  WFL   UPPER EXTREMITY MMT: WFL    TODAY'S TREATMENT:  12/22/21 Sitting tall sitting posture x 10                W back x 10  w/2#               Backward shoulder circles x 10               X to V x 5               ER 2# x 5  12/17/2021 Manual therapy to cervical spine jt mobs and lateral glides grade II-III  STM x 5 to cervical paraspinals and upper traps to decrease pain and increase tissue mobility; no other treatment performed during manual treatment Supine:  cervical rotation x 10;, cervical and scapular retraction x 10 , B shoulder flexion and chest press x 10  Sitting:  tall sitting posture x 10               W back x 10              Backward shoulder circles x 10              X to V x 5              Scapular retraction x 5   12/15/21: Manual therapy to cervical spine jt mobs and lateral glides grade II-III  STM x 5 to cervical paraspinals and upper traps to decrease pain and increase tissue mobility; no other treatment performed during manual treatment Supine:  cervical rotation x 10; cervical isometrics x 5 reps, cervical and scapular retraction x 10  Sitting:  cervical excursions x3 reps              W back x 10              Backward shoulder circles x 10  X to V x 10               Scapular retraction x 5   12/10/21 Manual therapy to cervical spine PAIVM c7-c3 PA and lateral glides grade II-III 30 ea segment STM x 5 to cervical paraspinals and upper traps to decrease pain and increase tissue mobility; no other treatment performed during manual treatment  Sitting: Cervical retractions x 10 Cervical retractions with extension x 10 Scapular retraction with YTB BIL ER x 10 Scapular retraction with YTB Rows x10 Thoracic extension x5   Supine: Cervical retractions x 10 Scapular retractions x 10 Scapular retraction with YTB horizontal abduction x10 Cervical rotation with gentle overpressure 5x5" ea      PATIENT EDUCATION:  Education details: The importance of posture in cervical health and HEP Person educated: Patient Education method: Explanation Education comprehension:  verbalized understanding   [place] 12/08/21 Access Code: [facility] URL: [email or web address] Date: 12/08/2021 Prepared by: [facility] - Rehab  Exercises - Seated Cervical Retraction  - 3 x daily - 7 x weekly - 1 sets - 10 reps - 3-5" hold - Seated Scapular Retraction  - 3 x daily - 7 x weekly - 1 sets - 10 reps - 3-5" hold - Supine Chin Tuck  - 2 x daily - 7 x weekly - 1 sets - 10 reps - Supine Scapular Retraction  - 2 x daily - 7 x weekly - 1 sets - 10 reps - Seated Cervical Retraction and Extension  - 2 x daily - 7 x weekly - 1 sets - 10 reps    ASSESSMENT:  CLINICAL IMPRESSION: Pt continues to complain of dizziness therefore cervical extension was avoided with this treatment.  Pt has MD appointment next week, therapist recommended speaking to the MD about possibly checking arterial flow in the cervical area.      OBJECTIVE IMPAIRMENTS decreased ROM, decreased strength, hypomobility, increased fascial restrictions, postural dysfunction, and pain.   ACTIVITY LIMITATIONS carrying, lifting, and reach over head  PARTICIPATION LIMITATIONS: cleaning and laundry  PERSONAL FACTORS Age, Fitness, and Time since onset of injury/illness/exacerbation are also affecting patient's functional outcome.   REHAB POTENTIAL: Good  CLINICAL DECISION MAKING: Stable/uncomplicated  EVALUATION COMPLEXITY: Low   GOALS: Goals reviewed with patient? No  SHORT TERM GOALS: Target date: 01/12/2022 Pt will start in a week   PT to be I in HEP to decrease pain to no greater than a 5/10 Baseline:  Goal status:in progress  2.  Pt cervical strength to increase 1/2 grade to allow pt to be able to read for 30 minutes without pain  Baseline:  Goal status: in progress   LONG TERM GOALS: Target date: 12/31/2021  Pt to be I in advanced HEP to decrease pain to no greater than a 2/10 Baseline:  Goal status: in progress  2.  Pt cervical strength to be increased one grade to  allow pt to be able to read for an hour without pain  Baseline:  Goal status: in progress  3.  PT cervical ROM to be at 60 B to allow awareness of environment Baseline:  Goal status: in progress  PLAN: PT FREQUENCY: 2x/week  PT DURATION: 4 weeks  PLANNED INTERVENTIONS: Therapeutic exercises, Therapeutic activity, Patient/Family education, Self Care, Joint mobilization, Dry Needling, Cryotherapy, Moist heat, and Manual therapy  PLAN FOR NEXT SESSION: Continue with cervical stretching and stability exercises to improve pain and function. Consider allowing soft collar use while performing upright exercises if unable to progress  from supine.  Rayetta Humphrey, [place] [phone]  [phone]

## 2021-12-24 ENCOUNTER — Encounter (HOSPITAL_COMMUNITY): Payer: Self-pay | Admitting: Physical Therapy

## 2021-12-24 ENCOUNTER — Ambulatory Visit (HOSPITAL_COMMUNITY): Payer: Medicare Other | Admitting: Physical Therapy

## 2021-12-24 DIAGNOSIS — M542 Cervicalgia: Secondary | ICD-10-CM | POA: Diagnosis not present

## 2021-12-24 NOTE — Therapy (Signed)
OUTPATIENT PHYSICAL THERAPY CERVICAL TREATMENT   Patient Name: Eric Brady MRN: [id number] DOB:Jan 20, 1933, 88 y.o., male Today's Date: 12/24/2021   Progress Note/ Recertification Reporting Period 11/24/21 to 12/24/21  See note below for Objective Data and Assessment of Progress/Goals.      PT End of Session - 12/24/21 1110     Visit Number 8    Number of Visits 16    Date for PT Re-Evaluation 01/21/22    Authorization Type [employer] medicare    Authorization Time Period Recert 4/12 - 87/86 for 8 more visits submitted    Progress Note Due on Visit 16    PT Start Time 1107    PT Stop Time 1150    PT Time Calculation (min) 43 min    Activity Tolerance Patient tolerated treatment well    Behavior During Therapy WFL for tasks assessed/performed                  Past Medical History:  Diagnosis Date   Anemia    Asthma    only as a child   Cancer ([place])    Prostate   Diabetes mellitus without complication ([place])    History of radiation therapy    Hyperlipidemia    Squamous cell carcinoma of skin 08/12/2019   in situ on right ear rim(CX3&EXC)   Past Surgical History:  Procedure Laterality Date   COLONOSCOPY N/A 09/12/2012   Procedure: COLONOSCOPY;  Surgeon: Rogene Houston, MD;  [facility];  Service: Endoscopy;  Laterality: N/A;  915   EYE SURGERY     LUMBAR LAMINECTOMY/DECOMPRESSION MICRODISCECTOMY Right 10/26/2020   Procedure: Microdiscectomy - Lumbar Three-Four Right;  Surgeon: Kary Kos, MD;  Location: [place];  Service: Neurosurgery;  Laterality: Right;  3C   ORIF ANKLE FRACTURE Right    YAG LASER APPLICATION Left 7/67/2094   Procedure: YAG LASER APPLICATION;  Surgeon: Williams Che, MD;  Location: [facility];  Service: Ophthalmology;  Laterality: Left;      PCP: Asencion Noble  REFERRING PROVIDER: Arther Abbott   REFERRING DIAG: M54.2 (ICD-10-CM) - Neck pain M47.812 (ICD-10-CM) - Spondylosis without myelopathy or radiculopathy, cervical region    THERAPY DIAG:  Cervicalgia  Rationale for Evaluation and Treatment Rehabilitation  ONSET DATE: chronic with acute exacerbation 5/23.   SUBJECTIVE:                                                                                                                                                                                                         SUBJECTIVE STATEMENT: Pt  reports he felt pretty well during the last couple of visits. His symptoms have been fluctuating but does feel he has made some progress. Reports this morning getting up was a bit "rough" and is at a 5/10 pain level today. 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  PERTINENT HISTORY:  DM, lumbar surgery   PAIN:  Are you having pain? Yes: NPRS scale: 5/10 highest pain is an 5/10  Pain location: Left cervical Pain description: ache and sharp  Aggravating factors: moving his head  Relieving factors: tylenol   PRECAUTIONS: None  WEIGHT BEARING RESTRICTIONS No  FALLS:  Has patient fallen in last 6 months? No  LIVING ENVIRONMENT: Lives with: lives alone Lives in: House/apartment  OCCUPATION: retired   PLOF: Requires assistive device for independence  PATIENT GOALS Less pain and to be able to move his head better.   OBJECTIVE:   DIAGNOSTIC FINDINGS:  The cervical lordosis is completely obliterated the cervical spine is Nalgest straight he has mid level upper and lower lateral cervical degenerative changes in the uncovertebral joints   The degenerative changes start at 2133 and 4 there is subluxation of 3 on 4 4 and 5 have obliteration of the disc space with large anterior osteophytes 5 and 6 disc space is narrowed 6 and 7 is completely obliterated  Reading: Advanced cervical spondylosis   PATIENT SURVEYS:  FOTO 45   COGNITION: Overall cognitive status:  Within functional limits for tasks assessed    POSTURE: rounded shoulders, forward head, and increased thoracic kyphosis  PALPATION: Noted mm spasms to B trap area    CERVICAL ROM:   Active ROM A/PROM (deg) eval AROM 12/08/21 AROM 12/22/21 AROM  12/24/21  Flexion 40   54  Extension 40   36  Right lateral flexion 12  19 12   Left lateral flexion 15  23 16   Right rotation 50 60 60 60  Left rotation 45 49 60 51   (Blank rows = not tested) Cervical strength:  SB B 4+/5 ; extension 4/5  (updated 12/24/21)  UPPER EXTREMITY ROM:  WFL   UPPER EXTREMITY MMT: [facility]    TODAY'S TREATMENT:  12/24/21 Reassessment Manual therapy to cervical spine jt mobs and lateral glides grade II-III  STM x 5 to cervical paraspinals and upper traps to decrease pain and increase tissue mobility; no other treatment performed during manual treatment Supine: Passive cervical rotation with overpressure  2x30" ea ;  Sitting tall sitting posture 5" x 10                W back x 10 w/2#               Backward shoulder circles 2x 10               X to V x10               ER 2# x10    Seated 2# shrugs x10   12/22/21 Sitting tall sitting posture x 10                W back x 10 w/2#               Backward shoulder circles x 10               X to V x 5  ER 2# x 5  12/17/2021 Manual therapy to cervical spine jt mobs and lateral glides grade II-III  STM x 5 to cervical paraspinals and upper traps to decrease pain and increase tissue mobility; no other treatment performed during manual treatment Supine:  cervical rotation x 10;, cervical and scapular retraction x 10 , B shoulder flexion and chest press x 10  Sitting:  tall sitting posture x 10               W back x 10              Backward shoulder circles x 10              X to V x 5              Scapular retraction x 5      PATIENT EDUCATION:  Education details: The importance of posture in cervical health and HEP Person educated:  Patient Education method: Explanation Education comprehension: verbalized understanding   [place] 12/08/21 Access Code: [facility] URL: [email or web address] Date: 12/08/2021 Prepared by: [facility] - Rehab  Exercises - Seated Cervical Retraction  - 3 x daily - 7 x weekly - 1 sets - 10 reps - 3-5" hold - Seated Scapular Retraction  - 3 x daily - 7 x weekly - 1 sets - 10 reps - 3-5" hold - Supine Chin Tuck  - 2 x daily - 7 x weekly - 1 sets - 10 reps - Supine Scapular Retraction  - 2 x daily - 7 x weekly - 1 sets - 10 reps - Seated Cervical Retraction and Extension  - 2 x daily - 7 x weekly - 1 sets - 10 reps    ASSESSMENT:  CLINICAL IMPRESSION: Progress note completed; demonstrates greatly improved cervical strength from baseline and significant improvement in ROM from baseline, although ranges have fluctuated up and down during last couple of assessments it seems. He is certainly tolerating more progression of exercises albeit slowly. Pt has a follow-up appointment with PCP next week apparently, and daughter is scheduling appointment with Dr. Aline Brochure to consider injections.  Pt and family would like to continue with conservative treatment as there appears to be progress with his pain and function.Recommend continued skilled PT until goals are met or plateau is reached.  OBJECTIVE IMPAIRMENTS decreased ROM, decreased strength, hypomobility, increased fascial restrictions, postural dysfunction, and pain.   ACTIVITY LIMITATIONS carrying, lifting, and reach over head  PARTICIPATION LIMITATIONS: cleaning and laundry  PERSONAL FACTORS Age, Fitness, and Time since onset of injury/illness/exacerbation are also affecting patient's functional outcome.   REHAB POTENTIAL: Good  CLINICAL DECISION MAKING: Stable/uncomplicated  EVALUATION COMPLEXITY: Low   GOALS: Goals reviewed with patient? No  SHORT TERM GOALS: Target date: 01/07/22   PT to be I in HEP to  decrease pain to no greater than a 5/10 Baseline: 5/10 Goal status: MET  2.  Pt cervical strength to increase 1/2 grade to allow pt to be able to read for 30 minutes without pain  Baseline: See above Goal status: MET  3.  Pt cervical strength to increase 5/5 grade to allow pt to be able to read for 30 minutes without pain  Baseline: See above Goal status: Updated   LONG TERM GOALS: Target date: 01/21/22  Pt to be I in advanced HEP to decrease pain to no greater than a 2/10 Baseline: (12/24/21 - pt 5/10 pain) Goal status: in progress  2.  Pt cervical strength to be  increased one grade to allow pt to be able to read for an hour without pain  Baseline: See above Goal status: Goal MET 3.  PT cervical ROM to be at 60 B to allow awareness of environment Baseline: 60 Rt 51Lt (12/24/21 fluctuating over last couple of visits but consistently improved from baseline.) Goal status: in progress  PLAN: PT FREQUENCY: 2x/week  PT DURATION: 4 weeks  PLANNED INTERVENTIONS: Therapeutic exercises, Therapeutic activity, Patient/Family education, Self Care, Joint mobilization, Dry Needling, Cryotherapy, Moist heat, and Manual therapy  PLAN FOR NEXT SESSION: Continue with cervical stretching and stability exercises to improve pain and function. Progress with further upright exercises.  Candie Mile, PT, DPT Physical Therapist Acute Rehabilitation Services [place] [facility]

## 2021-12-30 DIAGNOSIS — Z23 Encounter for immunization: Secondary | ICD-10-CM | POA: Diagnosis not present

## 2021-12-30 DIAGNOSIS — D631 Anemia in chronic kidney disease: Secondary | ICD-10-CM | POA: Diagnosis not present

## 2022-01-03 ENCOUNTER — Ambulatory Visit (HOSPITAL_COMMUNITY): Payer: Medicare Other

## 2022-01-04 ENCOUNTER — Encounter (HOSPITAL_COMMUNITY): Payer: Self-pay

## 2022-01-04 ENCOUNTER — Encounter (HOSPITAL_COMMUNITY)
Admission: RE | Admit: 2022-01-04 | Discharge: 2022-01-04 | Disposition: A | Discharge status: Home / Self Care | Payer: Medicare Other | Source: Ambulatory Visit | Attending: Nephrology | Admitting: Nephrology

## 2022-01-04 VITALS — BP 132/74 | HR 51 | Temp 98.4°F | Resp 18 | Ht 70.0 in | Wt 182.0 lb

## 2022-01-04 DIAGNOSIS — N184 Chronic kidney disease, stage 4 (severe): Secondary | ICD-10-CM | POA: Insufficient documentation

## 2022-01-04 DIAGNOSIS — D631 Anemia in chronic kidney disease: Secondary | ICD-10-CM | POA: Diagnosis not present

## 2022-01-04 LAB — CBC WITH DIFFERENTIAL/PLATELET
Abs Immature Granulocytes: 0.02 10*3/uL (ref 0.00–0.07)
Basophils Absolute: 0.1 10*3/uL (ref 0.0–0.1)
Basophils Relative: 1 %
Eosinophils Absolute: 0.4 10*3/uL (ref 0.0–0.5)
Eosinophils Relative: 5 %
HCT: 37.6 % — ABNORMAL LOW (ref 39.0–52.0)
Hemoglobin: 11.3 g/dL — ABNORMAL LOW (ref 13.0–17.0)
Immature Granulocytes: 0 %
Lymphocytes Relative: 31 %
Lymphs Abs: 2.5 10*3/uL (ref 0.7–4.0)
MCH: 27.9 pg (ref 26.0–34.0)
MCHC: 30.1 g/dL (ref 30.0–36.0)
MCV: 92.8 fL (ref 80.0–100.0)
Monocytes Absolute: 0.7 10*3/uL (ref 0.1–1.0)
Monocytes Relative: 9 %
Neutro Abs: 4.4 10*3/uL (ref 1.7–7.7)
Neutrophils Relative %: 54 %
Platelets: 165 10*3/uL (ref 150–400)
RBC: 4.05 MIL/uL — ABNORMAL LOW (ref 4.22–5.81)
RDW: 14.1 % (ref 11.5–15.5)
WBC: 8.1 10*3/uL (ref 4.0–10.5)
nRBC: 0 % (ref 0.0–0.2)

## 2022-01-04 LAB — IRON AND TIBC
Iron: 100 ug/dL (ref 45–182)
Saturation Ratios: 33 % (ref 17.9–39.5)
TIBC: 305 ug/dL (ref 250–450)
UIBC: 205 ug/dL

## 2022-01-04 LAB — POCT HEMOGLOBIN-HEMACUE: Hemoglobin: 12.9 g/dL — ABNORMAL LOW (ref 13.0–17.0)

## 2022-01-04 LAB — FERRITIN: Ferritin: 94 ng/mL (ref 24–336)

## 2022-01-04 MED ORDER — EPOETIN ALFA-EPBX 10000 UNIT/ML IJ SOLN: 10000.0000 [IU] | Freq: Once | INTRAMUSCULAR | Status: DC

## 2022-01-11 ENCOUNTER — Encounter (HOSPITAL_COMMUNITY): Payer: Self-pay | Admitting: Physical Therapy

## 2022-01-11 ENCOUNTER — Ambulatory Visit (HOSPITAL_COMMUNITY): Payer: Medicare Other | Attending: Orthopedic Surgery | Admitting: Physical Therapy

## 2022-01-11 DIAGNOSIS — M542 Cervicalgia: Secondary | ICD-10-CM | POA: Diagnosis not present

## 2022-01-11 NOTE — Therapy (Signed)
OUTPATIENT PHYSICAL THERAPY CERVICAL TREATMENT   Patient Name: Eric Brady MRN: [id number] DOB:19-Oct-1932, 88 y.o., male Today's Date: 01/11/2022   Progress Note/ Recertification Reporting Period 11/24/21 to 12/24/21  See note below for Objective Data and Assessment of Progress/Goals.      PT End of Session - 01/11/22 1608     Visit Number 9    Number of Visits 16    Date for PT Re-Evaluation 01/21/22    Authorization Type BCBS medicare    Authorization Time Period Recert 2/53 - 66/44 for 8 more visits submitted    Progress Note Due on Visit 16    PT Start Time 1603    PT Stop Time 1645    PT Time Calculation (min) 42 min    Activity Tolerance Patient tolerated treatment well    Behavior During Therapy WFL for tasks assessed/performed                  Past Medical History:  Diagnosis Date   Anemia    Asthma    only as a child   Cancer ([place])    Prostate   Diabetes mellitus without complication ([place])    History of radiation therapy    Hyperlipidemia    Squamous cell carcinoma of skin 08/12/2019   in situ on right ear rim(CX3&EXC)   Past Surgical History:  Procedure Laterality Date   COLONOSCOPY N/A 09/12/2012   Procedure: COLONOSCOPY;  Surgeon: Rogene Houston, MD;  [facility];  Service: Endoscopy;  Laterality: N/A;  915   EYE SURGERY     LUMBAR LAMINECTOMY/DECOMPRESSION MICRODISCECTOMY Right 10/26/2020   Procedure: Microdiscectomy - Lumbar Three-Four Right;  Surgeon: Kary Kos, MD;  Location: [place];  Service: Neurosurgery;  Laterality: Right;  3C   ORIF ANKLE FRACTURE Right    YAG LASER APPLICATION Left 0/34/7425   Procedure: YAG LASER APPLICATION;  Surgeon: Williams Che, MD;  Location: [facility];  Service: Ophthalmology;  Laterality: Left;      PCP: Asencion Noble  REFERRING PROVIDER: Arther Abbott   REFERRING DIAG: M54.2 (ICD-10-CM) - Neck pain M47.812 (ICD-10-CM) - Spondylosis without myelopathy or radiculopathy, cervical region    THERAPY DIAG:  Cervicalgia  Rationale for Evaluation and Treatment Rehabilitation  ONSET DATE: chronic with acute exacerbation 5/23.   SUBJECTIVE:                                                                                                                                                                                                         SUBJECTIVE STATEMENT: States  he has had moderate pain over the past week. Nothing new to report. Does not recall HEP.   PERTINENT HISTORY:  DM, lumbar surgery   PAIN:  Are you having pain? Yes: NPRS scale: 4/10 highest pain is an 5/10  Pain location: Left cervical Pain description: ache and sharp  Aggravating factors: moving his head  Relieving factors: tylenol   PRECAUTIONS: None  WEIGHT BEARING RESTRICTIONS No  FALLS:  Has patient fallen in last 6 months? No  LIVING ENVIRONMENT: Lives with: lives alone Lives in: House/apartment  OCCUPATION: retired   PLOF: Requires assistive device for independence  PATIENT GOALS Less pain and to be able to move his head better.   OBJECTIVE:   DIAGNOSTIC FINDINGS:  The cervical lordosis is completely obliterated the cervical spine is Nalgest straight he has mid level upper and lower lateral cervical degenerative changes in the uncovertebral joints   The degenerative changes start at 2133 and 4 there is subluxation of 3 on 4 4 and 5 have obliteration of the disc space with large anterior osteophytes 5 and 6 disc space is narrowed 6 and 7 is completely obliterated  Reading: Advanced cervical spondylosis   PATIENT SURVEYS:  FOTO 45   COGNITION: Overall cognitive status: Within functional limits for tasks assessed    POSTURE: rounded shoulders, forward head, and increased thoracic kyphosis  PALPATION: Noted mm spasms to B trap area    CERVICAL ROM:   Active ROM A/PROM (deg) eval AROM 12/08/21 AROM 12/22/21 AROM  12/24/21  Flexion 40   54  Extension 40   36  Right lateral  flexion 12  19 12   Left lateral flexion 15  23 16   Right rotation 50 60 60 60  Left rotation 45 49 60 51   (Blank rows = not tested) Cervical strength:  SB B 4+/5 ; extension 4/5  (updated 12/24/21)  UPPER EXTREMITY ROM:  WFL   UPPER EXTREMITY MMT: WFL    TODAY'S TREATMENT:  01/11/22 UE ergo lvl 1 backwards x 3'  Manual therapy to cervical spine cPA, and unilateral PA jt mobs and lateral glides grade II-III  STM x 5' to cervical paraspinals and upper traps to decrease pain and increase tissue mobility; no other treatment performed during manual treatment  Supine:   Chin tuck x10 Cervical extension isometrics x10 3" hold  Sitting:               W back x 10 w/1#               Backward shoulder circles 2x 10      Thoracic extension with overpressure x10 3" hold               X to V x10               BIL ER YTB 2x10    RTB ROWs 2x10    Seated 3# shrugs 2x10  12/24/21 Reassessment Manual therapy to cervical spine jt mobs and lateral glides grade II-III  STM x 5 to cervical paraspinals and upper traps to decrease pain and increase tissue mobility; no other treatment performed during manual treatment Supine: Passive cervical rotation with overpressure  2x30" ea ;  Sitting tall sitting posture 5" x 10                W back x 10 w/2#               Backward shoulder circles 2x 10  X to V x10               ER 2# x10    Seated 2# shrugs x10   12/22/21 Sitting tall sitting posture x 10                W back x 10 w/2#               Backward shoulder circles x 10               X to V x 5               ER 2# x 5       PATIENT EDUCATION:  Education details: The importance of posture in cervical health and HEP Person educated: Patient Education method: Explanation Education comprehension: verbalized understanding   [place] 12/08/21 Access Code: [facility] URL: [email or web address] Date: 12/08/2021 Prepared by: [facility] -  Rehab  Exercises - Seated Cervical Retraction  - 3 x daily - 7 x weekly - 1 sets - 10 reps - 3-5" hold - Seated Scapular Retraction  - 3 x daily - 7 x weekly - 1 sets - 10 reps - 3-5" hold - Supine Chin Tuck  - 2 x daily - 7 x weekly - 1 sets - 10 reps - Supine Scapular Retraction  - 2 x daily - 7 x weekly - 1 sets - 10 reps - Seated Cervical Retraction and Extension  - 2 x daily - 7 x weekly - 1 sets - 10 reps    ASSESSMENT:  CLINICAL IMPRESSION: Provided gentle overpressure with thoracic extension, arms crossed to keep neck in neutral between arms. Patient did report a reduction in pain level during exercises performed today.   OBJECTIVE IMPAIRMENTS decreased ROM, decreased strength, hypomobility, increased fascial restrictions, postural dysfunction, and pain.   ACTIVITY LIMITATIONS carrying, lifting, and reach over head  PARTICIPATION LIMITATIONS: cleaning and laundry  PERSONAL FACTORS Age, Fitness, and Time since onset of injury/illness/exacerbation are also affecting patient's functional outcome.   REHAB POTENTIAL: Good  CLINICAL DECISION MAKING: Stable/uncomplicated  EVALUATION COMPLEXITY: Low   GOALS: Goals reviewed with patient? No  SHORT TERM GOALS: Target date: 01/07/22   PT to be I in HEP to decrease pain to no greater than a 5/10 Baseline: 5/10 Goal status: MET  2.  Pt cervical strength to increase 1/2 grade to allow pt to be able to read for 30 minutes without pain  Baseline: See above Goal status: MET  3.  Pt cervical strength to increase 5/5 grade to allow pt to be able to read for 30 minutes without pain  Baseline: See above Goal status: Updated   LONG TERM GOALS: Target date: 01/21/22  Pt to be I in advanced HEP to decrease pain to no greater than a 2/10 Baseline: (12/24/21 - pt 5/10 pain) Goal status: in progress  2.  Pt cervical strength to be increased one grade to allow pt to be able to read for an hour without pain  Baseline: See above Goal  status: Goal MET 3.  PT cervical ROM to be at 60 B to allow awareness of environment Baseline: 60 Rt 51Lt (12/24/21 fluctuating over last couple of visits but consistently improved from baseline.) Goal status: in progress  PLAN: PT FREQUENCY: 2x/week  PT DURATION: 4 weeks  PLANNED INTERVENTIONS: Therapeutic exercises, Therapeutic activity, Patient/Family education, Self Care, Joint mobilization, Dry Needling, Cryotherapy, Moist heat, and Manual therapy  PLAN FOR NEXT SESSION:  Continue with cervical stretching and stability exercises to improve pain and function. Progress with further upright exercises.  Candie Mile, PT, DPT Physical Therapist Acute Rehabilitation Services [place] [facility]

## 2022-01-12 ENCOUNTER — Ambulatory Visit (HOSPITAL_COMMUNITY): Payer: Medicare Other

## 2022-01-12 DIAGNOSIS — M542 Cervicalgia: Secondary | ICD-10-CM

## 2022-01-12 NOTE — Therapy (Signed)
OUTPATIENT PHYSICAL THERAPY CERVICAL DISCHARGE/PROGRESS NOTE PHYSICAL THERAPY DISCHARGE SUMMARY  Visits from Start of Care: 10  Current functional level related to goals / functional outcomes: SEE BELOW   Remaining deficits: See below   Education / Equipment: See below   Patient agrees to discharge. Patient goals were partially met. Patient is being discharged due to maximized rehab potential.     Patient Name: Eric Brady MRN: [id number] DOB:[birth year], 88 y.o., male Today's Date: 01/12/2022   Progress Note/ Recertification Reporting Period 11/24/21 to 01/12/22  See note below for Objective Data and Assessment of Progress/Goals.      PT End of Session - 01/12/22 0949     Visit Number 10    Number of Visits 16    Date for PT Re-Evaluation 01/21/22    Authorization Type [employer]BS medicare    Authorization Time Period Recert 9/39 - 68/86 for 8 more visits submitted    Progress Note Due on Visit 16    PT Start Time 0946    PT Stop Time 1028    PT Time Calculation (min) 42 min    Activity Tolerance Patient tolerated treatment well    Behavior During Therapy [facility] for tasks assessed/performed                   Past Medical History:  Diagnosis Date   Anemia    Asthma    only as a child   Cancer ([place])    Prostate   Diabetes mellitus without complication ([place])    History of radiation therapy    Hyperlipidemia    Squamous cell carcinoma of skin 08/12/2019   in situ on right ear rim(CX3&EXC)   Past Surgical History:  Procedure Laterality Date   COLONOSCOPY N/A 09/12/2012   Procedure: COLONOSCOPY;  Surgeon: Rogene Houston, MD;  [facility];  Service: Endoscopy;  Laterality: N/A;  915   EYE SURGERY     LUMBAR LAMINECTOMY/DECOMPRESSION MICRODISCECTOMY Right 10/26/2020   Procedure: Microdiscectomy - Lumbar Three-Four Right;  Surgeon: Kary Kos, MD;  Location: [place];  Service: Neurosurgery;  Laterality: Right;  3C   ORIF ANKLE FRACTURE Right     YAG LASER APPLICATION Left 4/84/7207   Procedure: YAG LASER APPLICATION;  Surgeon: Williams Che, MD;  Location: [facility];  Service: Ophthalmology;  Laterality: Left;      PCP: Asencion Noble  REFERRING PROVIDER: Arther Abbott   REFERRING DIAG: M54.2 (ICD-10-CM) - Neck pain M47.812 (ICD-10-CM) - Spondylosis without myelopathy or radiculopathy, cervical region   THERAPY DIAG:  Cervicalgia  Rationale for Evaluation and Treatment Rehabilitation  ONSET DATE: chronic with acute exacerbation 5/23.   SUBJECTIVE:  SUBJECTIVE STATEMENT: About 20 to 25% better; " my neck hurts all the time"; took some Tylenol this morning   PERTINENT HISTORY:  DM, lumbar surgery   PAIN:  Are you having pain? Yes: NPRS scale: 4/10 highest pain is an 5/10  Pain location: Left cervical Pain description: ache and sharp  Aggravating factors: moving his head  Relieving factors: tylenol   PRECAUTIONS: None  WEIGHT BEARING RESTRICTIONS No  FALLS:  Has patient fallen in last 6 months? No  LIVING ENVIRONMENT: Lives with: lives alone Lives in: House/apartment  OCCUPATION: retired   PLOF: Requires assistive device for independence  PATIENT GOALS Less pain and to be able to move his head better.   OBJECTIVE:   DIAGNOSTIC FINDINGS:  The cervical lordosis is completely obliterated the cervical spine is Nalgest straight he has mid level upper and lower lateral cervical degenerative changes in the uncovertebral joints   The degenerative changes start at 2133 and 4 there is subluxation of 3 on 4 4 and 5 have obliteration of the disc space with large anterior osteophytes 5 and 6 disc space is narrowed 6 and 7 is completely obliterated  Reading: Advanced cervical spondylosis   PATIENT SURVEYS:  FOTO  45   COGNITION: Overall cognitive status: Within functional limits for tasks assessed    POSTURE: rounded shoulders, forward head, and increased thoracic kyphosis  PALPATION: Noted mm spasms to B trap area    CERVICAL ROM:   Active ROM A/PROM (deg) eval AROM 12/08/21 AROM 12/22/21 AROM  12/24/21 AROM 01/12/22  Flexion 40   54 36  Extension 40   36 42  Right lateral flexion _0 Left lateral flexion _1 Right rotation 50 60 60 60 49  Left rotation 45 49 60 51 50   (Blank rows = not tested) Cervical strength:  SB B 4+/5 ; extension 4/5  (updated 12/24/21)  UPPER EXTREMITY ROM:  WFL   UPPER EXTREMITY MMT: WFL    TODAY'S TREATMENT:  01/12/22 FOTO 62 ROM Progress note  STM to cervical spine and upper traps x 10' to decrease pain and increase tissue mobility; no other treatment performed during manual treatment  Seated: Cervical retractions x 10 Thoracic extensions x 10  Review of HeP    01/11/22 UE ergo lvl 1 backwards x 3'  Manual therapy to cervical spine cPA, and unilateral PA jt mobs and lateral glides grade II-III  STM x 5' to cervical paraspinals and upper traps to decrease pain and increase tissue mobility; no other treatment performed during manual treatment  Supine:   Chin tuck x10 Cervical extension isometrics x10 3" hold  Sitting:               W back x 10 w/1#               Backward shoulder circles 2x 10      Thoracic extension with overpressure x10 3" hold               X to V x10               BIL ER YTB 2x10    RTB ROWs 2x10    Seated 3# shrugs 2x10  12/24/21 Reassessment Manual therapy to cervical spine jt mobs and lateral glides grade II-III  STM x 5 to cervical paraspinals and upper traps to decrease pain and increase tissue mobility; no other treatment performed during manual treatment Supine: Passive  cervical rotation with overpressure  2x30" ea ;  Sitting tall sitting posture 5" x 10                W back x 10  w/2#               Backward shoulder circles 2x 10               X to V x10               ER 2# x10    Seated 2# shrugs x10   12/22/21 Sitting tall sitting posture x 10                W back x 10 w/2#               Backward shoulder circles x 10               X to V x 5               ER 2# x 5       PATIENT EDUCATION:  Education details: The importance of posture in cervical health and HEP Person educated: Patient Education method: Explanation Education comprehension: verbalized understanding Access Code: [facility] URL: [email or web address] Date: 01/12/2022 Prepared by: [facility] - [facility]  Exercises - Seated Cervical Retraction  - 3 x daily - 7 x weekly - 1 sets - 10 reps - 3-5" hold - Seated Scapular Retraction  - 3 x daily - 7 x weekly - 1 sets - 10 reps - 3-5" hold - Supine Chin Tuck  - 2 x daily - 7 x weekly - 1 sets - 10 reps - Supine Scapular Retraction  - 2 x daily - 7 x weekly - 1 sets - 10 reps - Seated Cervical Retraction and Extension  - 2 x daily - 7 x weekly - 1 sets - 10 reps - Seated Thoracic Lumbar Extension  - 2 x daily - 7 x weekly - 1 sets - 10 reps   HOME EXERCISE PROGRAM:[email or web address] 12/08/21 Access Code: [facility] URL: [email or web address] Date: 12/08/2021 Prepared by: [facility] - Re[facility]  Exercises - Seated Cervical Retraction  - 3 x daily - 7 x weekly - 1 sets - 10 reps - 3-5" hold - Seated Scapular Retraction  - 3 x daily - 7 x weekly - 1 sets - 10 reps - 3-5" hold - Supine Chin Tuck  - 2 x daily - 7 x weekly - 1 sets - 10 reps - Supine Scapular Retraction  - 2 x daily - 7 x weekly - 1 sets - 10 reps - Seated Cervical Retraction and Extension  - 2 x daily - 7 x weekly - 1 sets - 10 reps    ASSESSMENT:  CLINICAL IMPRESSION: Progress note today.  Patient with minimal changes with cervical ROM; good improvement with FOTO score.  Overall minimal progress. Discharge formal PT at this time and recommended patient follow up with Dr.  Aline Brochure and possibly see a neurosurgeon. Patient agreeable to discharge at this time.  OBJECTIVE IMPAIRMENTS decreased ROM, decreased strength, hypomobility, increased fascial restrictions, postural dysfunction, and pain.   ACTIVITY LIMITATIONS carrying, lifting, and reach over head  PARTICIPATION LIMITATIONS: cleaning and laundry  PERSONAL FACTORS Age, Fitness, and Time since onset of injury/illness/exacerbation are also affecting patient's functional outcome.   REHAB POTENTIAL: Good  CLINICAL DECISION MAKING: Stable/uncomplicated  EVALUATION COMPLEXITY: Low   GOALS: Goals reviewed with patient? Yes  SHORT TERM  GOALS: Target date: 01/07/22   PT to be I in HEP to decrease pain to no greater than a 5/10 Baseline: 5/10 Goal status: MET  2.  Pt cervical strength to increase 1/2 grade to allow pt to be able to read for 30 minutes without pain  Baseline: See above Goal status: MET  3.  Pt cervical strength to increase 5/5 grade to allow pt to be able to read for 30 minutes without pain  Baseline: See above Goal status: Updated   LONG TERM GOALS: Target date: 01/21/22  Pt to be I in advanced HEP to decrease pain to no greater than a 2/10 Baseline: (12/24/21 - pt 5/10 pain) Goal status: in progress  2.  Pt cervical strength to be increased one grade to allow pt to be able to read for an hour without pain  Baseline: See above Goal status: Goal MET 3.  PT cervical ROM to be at 60 B to allow awareness of environment Baseline: 60 Rt 51Lt (12/24/21 fluctuating over last couple of visits but consistently improved from baseline.) see above Goal status: in progress  PLAN: PT FREQUENCY: 2x/week  PT DURATION: 4 weeks  PLANNED INTERVENTIONS: Therapeutic exercises, Therapeutic activity, Patient/Family education, Self Care, Joint mobilization, Dry Needling, Cryotherapy, Moist heat, and Manual therapy  PLAN FOR NEXT SESSION: Continue with cervical stretching and stability exercises to  improve pain and function. Progress with further upright exercises.  10:36 AM, 01/12/22 Malaney Mcbean [facility] MPT [facility] [place] [phone]

## 2022-01-18 ENCOUNTER — Encounter (HOSPITAL_COMMUNITY): Payer: Self-pay

## 2022-01-18 ENCOUNTER — Encounter (HOSPITAL_COMMUNITY): Payer: Medicare Other | Admitting: Physical Therapy

## 2022-01-18 ENCOUNTER — Encounter (HOSPITAL_COMMUNITY)
Admission: RE | Admit: 2022-01-18 | Discharge: 2022-01-18 | Disposition: A | Discharge status: Home / Self Care | Payer: Medicare Other | Source: Ambulatory Visit | Attending: Nephrology | Admitting: Nephrology

## 2022-01-18 VITALS — BP 136/73 | HR 50 | Temp 97.5°F | Resp 18 | Ht 70.0 in | Wt 182.0 lb

## 2022-01-18 DIAGNOSIS — N184 Chronic kidney disease, stage 4 (severe): Secondary | ICD-10-CM

## 2022-01-18 DIAGNOSIS — D631 Anemia in chronic kidney disease: Secondary | ICD-10-CM | POA: Diagnosis not present

## 2022-01-18 LAB — BASIC METABOLIC PANEL
Anion gap: 6 (ref 5–15)
BUN: 61 mg/dL — ABNORMAL HIGH (ref 8–23)
CO2: 20 mmol/L — ABNORMAL LOW (ref 22–32)
Calcium: 9 mg/dL (ref 8.9–10.3)
Chloride: 116 mmol/L — ABNORMAL HIGH (ref 98–111)
Creatinine, Ser: 4.39 mg/dL — ABNORMAL HIGH (ref 0.61–1.24)
GFR, Estimated: 12 mL/min — ABNORMAL LOW (ref >60)
Glucose, Bld: 160 mg/dL — ABNORMAL HIGH (ref 70–99)
Potassium: 4.9 mmol/L (ref 3.5–5.1)
Sodium: 142 mmol/L (ref 135–145)

## 2022-01-18 MED ORDER — EPOETIN ALFA-EPBX 10000 UNIT/ML IJ SOLN
INTRAMUSCULAR | Status: AC
  Filled 2022-01-18: qty 1

## 2022-01-18 MED ORDER — EPOETIN ALFA-EPBX 10000 UNIT/ML IJ SOLN
10000.0000 [IU] | Freq: Once | INTRAMUSCULAR | Status: AC
  Administered 2022-01-18: 10000 [IU] via SUBCUTANEOUS

## 2022-01-19 ENCOUNTER — Ambulatory Visit (INDEPENDENT_AMBULATORY_CARE_PROVIDER_SITE_OTHER): Payer: Medicare Other | Admitting: Orthopedic Surgery

## 2022-01-19 DIAGNOSIS — M542 Cervicalgia: Secondary | ICD-10-CM | POA: Diagnosis not present

## 2022-01-19 NOTE — Progress Notes (Signed)
Follow-up visit  Chief Complaint  Patient presents with   Neck Pain    Feeling better with Tylenol 650 mg two tablets bid, completed therapy     87 year old male with cervical spondylosis sent for physical therapy takes Tylenol.  Cannot take anti-inflammatories because of renal insufficiency  He went to physical therapy improved his range of motion but his pain did not improve  He currently takes to 650 mg tablets twice a day as needed  I think he needs an MRI and then a referral to neurosurgery.  There is nothing else I can do or give him for medication or treatment  His C-spine x-ray shows multilevel degenerative disc disease and moderate to severe spondylosis  When the MRI is back I will call the patient to let him know the results

## 2022-01-19 NOTE — Patient Instructions (Signed)
While we are working on your approval for MRI please go ahead and call to schedule your appointment with [place] within at least one (1) week.   Central Scheduling [phone]  Dr Saintclair Halsted will call to make appointment after the MRI report is available

## 2022-01-21 ENCOUNTER — Encounter (HOSPITAL_COMMUNITY): Payer: Medicare Other

## 2022-01-26 LAB — POCT HEMOGLOBIN-HEMACUE: Hemoglobin: 10.9 g/dL — ABNORMAL LOW (ref 13.0–17.0)

## 2022-02-01 ENCOUNTER — Encounter (HOSPITAL_COMMUNITY)
Admission: RE | Admit: 2022-02-01 | Discharge: 2022-02-01 | Disposition: A | Discharge status: Home / Self Care | Payer: Medicare Other | Source: Ambulatory Visit | Attending: Nephrology | Admitting: Nephrology

## 2022-02-01 DIAGNOSIS — D631 Anemia in chronic kidney disease: Secondary | ICD-10-CM | POA: Insufficient documentation

## 2022-02-01 DIAGNOSIS — N184 Chronic kidney disease, stage 4 (severe): Secondary | ICD-10-CM

## 2022-02-02 ENCOUNTER — Encounter (HOSPITAL_COMMUNITY)
Admission: RE | Admit: 2022-02-02 | Discharge: 2022-02-02 | Disposition: A | Discharge status: Home / Self Care | Payer: Medicare Other | Source: Ambulatory Visit | Attending: Nephrology | Admitting: Nephrology

## 2022-02-02 VITALS — BP 97/61 | HR 69 | Temp 97.6°F | Resp 18

## 2022-02-02 DIAGNOSIS — D631 Anemia in chronic kidney disease: Secondary | ICD-10-CM | POA: Diagnosis not present

## 2022-02-02 DIAGNOSIS — N184 Chronic kidney disease, stage 4 (severe): Secondary | ICD-10-CM | POA: Insufficient documentation

## 2022-02-02 MED ORDER — EPOETIN ALFA-EPBX 10000 UNIT/ML IJ SOLN
10000.0000 [IU] | Freq: Once | INTRAMUSCULAR | Status: AC
  Administered 2022-02-02: 10000 [IU] via SUBCUTANEOUS
  Filled 2022-02-02: qty 1

## 2022-02-02 NOTE — Progress Notes (Signed)
Diagnosis: Anemia in Chronic Kidney Disease  Provider:  Ihor Dow MD  Procedure: Injection   Retacrit, Dose: 10000 Units   Post Infusion IV Care: Observation period completed  Discharge: Condition: Good, Destination: Home . AVS provided to patient.   Performed by:  Hughie Closs, RN

## 2022-02-03 DIAGNOSIS — D519 Vitamin B12 deficiency anemia, unspecified: Secondary | ICD-10-CM | POA: Diagnosis not present

## 2022-02-03 LAB — POCT HEMOGLOBIN-HEMACUE: Hemoglobin: 10.7 g/dL — ABNORMAL LOW (ref 13.0–17.0)

## 2022-02-03 NOTE — Addendum Note (Signed)
Encounter addended by: Terence Lux, RN on: 02/03/2022 10:41 AM  Actions taken: Order list changed, Therapy plan modified

## 2022-02-09 ENCOUNTER — Ambulatory Visit (HOSPITAL_COMMUNITY)
Admission: RE | Admit: 2022-02-09 | Discharge: 2022-02-09 | Disposition: A | Discharge status: Home / Self Care | Payer: Medicare Other | Source: Ambulatory Visit | Attending: Orthopedic Surgery | Admitting: Orthopedic Surgery

## 2022-02-09 DIAGNOSIS — M542 Cervicalgia: Secondary | ICD-10-CM | POA: Insufficient documentation

## 2022-02-16 ENCOUNTER — Other Ambulatory Visit: Payer: Self-pay

## 2022-02-16 ENCOUNTER — Telehealth: Payer: Self-pay | Admitting: Cardiology

## 2022-02-16 ENCOUNTER — Emergency Department (HOSPITAL_COMMUNITY)
Admission: EM | Admit: 2022-02-16 | Discharge: 2022-02-16 | Disposition: A | Discharge status: Home / Self Care | Payer: Medicare Other | Attending: Emergency Medicine | Admitting: Emergency Medicine

## 2022-02-16 ENCOUNTER — Encounter (HOSPITAL_COMMUNITY): Payer: Self-pay

## 2022-02-16 ENCOUNTER — Encounter (HOSPITAL_COMMUNITY)
Admission: RE | Admit: 2022-02-16 | Discharge: 2022-02-16 | Disposition: A | Discharge status: Home / Self Care | Payer: Medicare Other | Source: Ambulatory Visit | Attending: Nephrology | Admitting: Nephrology

## 2022-02-16 VITALS — BP 126/77 | HR 53 | Temp 97.8°F | Resp 20

## 2022-02-16 DIAGNOSIS — Z7984 Long term (current) use of oral hypoglycemic drugs: Secondary | ICD-10-CM | POA: Diagnosis not present

## 2022-02-16 DIAGNOSIS — J45909 Unspecified asthma, uncomplicated: Secondary | ICD-10-CM | POA: Insufficient documentation

## 2022-02-16 DIAGNOSIS — Z7982 Long term (current) use of aspirin: Secondary | ICD-10-CM | POA: Insufficient documentation

## 2022-02-16 DIAGNOSIS — E119 Type 2 diabetes mellitus without complications: Secondary | ICD-10-CM | POA: Insufficient documentation

## 2022-02-16 DIAGNOSIS — Z8546 Personal history of malignant neoplasm of prostate: Secondary | ICD-10-CM | POA: Insufficient documentation

## 2022-02-16 DIAGNOSIS — E875 Hyperkalemia: Secondary | ICD-10-CM | POA: Insufficient documentation

## 2022-02-16 DIAGNOSIS — R001 Bradycardia, unspecified: Secondary | ICD-10-CM | POA: Diagnosis not present

## 2022-02-16 DIAGNOSIS — N184 Chronic kidney disease, stage 4 (severe): Secondary | ICD-10-CM

## 2022-02-16 DIAGNOSIS — D631 Anemia in chronic kidney disease: Secondary | ICD-10-CM

## 2022-02-16 DIAGNOSIS — I129 Hypertensive chronic kidney disease with stage 1 through stage 4 chronic kidney disease, or unspecified chronic kidney disease: Secondary | ICD-10-CM | POA: Diagnosis not present

## 2022-02-16 DIAGNOSIS — Z85828 Personal history of other malignant neoplasm of skin: Secondary | ICD-10-CM | POA: Diagnosis not present

## 2022-02-16 DIAGNOSIS — N189 Chronic kidney disease, unspecified: Secondary | ICD-10-CM | POA: Diagnosis not present

## 2022-02-16 DIAGNOSIS — R030 Elevated blood-pressure reading, without diagnosis of hypertension: Secondary | ICD-10-CM | POA: Diagnosis not present

## 2022-02-16 LAB — CBC WITH DIFFERENTIAL/PLATELET
Abs Immature Granulocytes: 0.01 10*3/uL (ref 0.00–0.07)
Abs Immature Granulocytes: 0.02 10*3/uL (ref 0.00–0.07)
Basophils Absolute: 0.1 10*3/uL (ref 0.0–0.1)
Basophils Absolute: 0.1 10*3/uL (ref 0.0–0.1)
Basophils Relative: 1 %
Basophils Relative: 1 %
Eosinophils Absolute: 0.3 10*3/uL (ref 0.0–0.5)
Eosinophils Absolute: 0.4 10*3/uL (ref 0.0–0.5)
Eosinophils Relative: 5 %
Eosinophils Relative: 5 %
HCT: 37.6 % — ABNORMAL LOW (ref 39.0–52.0)
HCT: 35.6 % — ABNORMAL LOW (ref 39.0–52.0)
Hemoglobin: 11.1 g/dL — ABNORMAL LOW (ref 13.0–17.0)
Hemoglobin: 10.7 g/dL — ABNORMAL LOW (ref 13.0–17.0)
Immature Granulocytes: 0 %
Immature Granulocytes: 0 %
Lymphocytes Relative: 31 %
Lymphocytes Relative: 33 %
Lymphs Abs: 2.2 10*3/uL (ref 0.7–4.0)
Lymphs Abs: 2.4 10*3/uL (ref 0.7–4.0)
MCH: 28.4 pg (ref 26.0–34.0)
MCH: 28.5 pg (ref 26.0–34.0)
MCHC: 29.5 g/dL — ABNORMAL LOW (ref 30.0–36.0)
MCHC: 30.1 g/dL (ref 30.0–36.0)
MCV: 96.2 fL (ref 80.0–100.0)
MCV: 94.9 fL (ref 80.0–100.0)
Monocytes Absolute: 0.7 10*3/uL (ref 0.1–1.0)
Monocytes Absolute: 0.6 10*3/uL (ref 0.1–1.0)
Monocytes Relative: 9 %
Monocytes Relative: 9 %
Neutro Abs: 3.8 10*3/uL (ref 1.7–7.7)
Neutro Abs: 3.8 10*3/uL (ref 1.7–7.7)
Neutrophils Relative %: 54 %
Neutrophils Relative %: 52 %
Platelets: 177 10*3/uL (ref 150–400)
Platelets: 190 10*3/uL (ref 150–400)
RBC: 3.91 MIL/uL — ABNORMAL LOW (ref 4.22–5.81)
RBC: 3.75 MIL/uL — ABNORMAL LOW (ref 4.22–5.81)
RDW: 16.4 % — ABNORMAL HIGH (ref 11.5–15.5)
RDW: 16.4 % — ABNORMAL HIGH (ref 11.5–15.5)
WBC: 7.1 10*3/uL (ref 4.0–10.5)
WBC: 7.2 10*3/uL (ref 4.0–10.5)
nRBC: 0 % (ref 0.0–0.2)
nRBC: 0 % (ref 0.0–0.2)

## 2022-02-16 LAB — MAGNESIUM: Magnesium: 1.8 mg/dL (ref 1.7–2.4)

## 2022-02-16 LAB — COMPREHENSIVE METABOLIC PANEL
ALT: 12 U/L (ref 0–44)
AST: 15 U/L (ref 15–41)
Albumin: 3.9 g/dL (ref 3.5–5.0)
Alkaline Phosphatase: 55 U/L (ref 38–126)
Anion gap: 6 (ref 5–15)
BUN: 58 mg/dL — ABNORMAL HIGH (ref 8–23)
CO2: 21 mmol/L — ABNORMAL LOW (ref 22–32)
Calcium: 8.8 mg/dL — ABNORMAL LOW (ref 8.9–10.3)
Chloride: 112 mmol/L — ABNORMAL HIGH (ref 98–111)
Creatinine, Ser: 4.24 mg/dL — ABNORMAL HIGH (ref 0.61–1.24)
GFR, Estimated: 13 mL/min — ABNORMAL LOW (ref >60)
Glucose, Bld: 112 mg/dL — ABNORMAL HIGH (ref 70–99)
Potassium: 5.5 mmol/L — ABNORMAL HIGH (ref 3.5–5.1)
Sodium: 139 mmol/L (ref 135–145)
Total Bilirubin: 0.4 mg/dL (ref 0.3–1.2)
Total Protein: 6.9 g/dL (ref 6.5–8.1)

## 2022-02-16 LAB — IRON AND TIBC
Iron: 109 ug/dL (ref 45–182)
Saturation Ratios: 32 % (ref 17.9–39.5)
TIBC: 336 ug/dL (ref 250–450)
UIBC: 227 ug/dL

## 2022-02-16 LAB — POCT HEMOGLOBIN-HEMACUE: Hemoglobin: 11.3 g/dL — ABNORMAL LOW (ref 13.0–17.0)

## 2022-02-16 LAB — FERRITIN: Ferritin: 98 ng/mL (ref 24–336)

## 2022-02-16 LAB — TSH: TSH: 1.31 u[IU]/mL (ref 0.350–4.500)

## 2022-02-16 MED ORDER — SODIUM CHLORIDE 0.9 % IV BOLUS
1000.0000 mL | Freq: Once | INTRAVENOUS | Status: AC
  Administered 2022-02-16: 1000 mL via INTRAVENOUS

## 2022-02-16 MED ORDER — CLONIDINE HCL 0.1 MG PO TABS: 0.1000 mg | ORAL_TABLET | ORAL | Status: DC | PRN

## 2022-02-16 MED ORDER — LOKELMA 10 G PO PACK: 10.0000 g | PACK | ORAL | 0 refills | Status: AC

## 2022-02-16 MED ORDER — SODIUM ZIRCONIUM CYCLOSILICATE 5 G PO PACK
10.0000 g | PACK | Freq: Once | ORAL | Status: AC
  Administered 2022-02-16: 10 g via ORAL
  Filled 2022-02-16: qty 2

## 2022-02-16 MED ORDER — HYDRALAZINE HCL 20 MG/ML IJ SOLN
5.0000 mg | Freq: Once | INTRAMUSCULAR | Status: AC
  Administered 2022-02-16: 5 mg via INTRAVENOUS
  Filled 2022-02-16: qty 1

## 2022-02-16 MED ORDER — EPOETIN ALFA-EPBX 10000 UNIT/ML IJ SOLN: 10000.0000 [IU] | Freq: Once | INTRAMUSCULAR | Status: DC

## 2022-02-16 MED ORDER — HYDRALAZINE HCL 20 MG/ML IJ SOLN
5.0000 mg | Freq: Once | INTRAMUSCULAR | Status: DC
  Filled 2022-02-16: qty 1

## 2022-02-16 NOTE — Telephone Encounter (Signed)
Secure chat message sent to provider.

## 2022-02-16 NOTE — ED Notes (Signed)
Patient is asymptomatic with HTN coming from kidney clinic.

## 2022-02-16 NOTE — Telephone Encounter (Signed)
STAT if HR is under 50 or over 120 (normal HR is 60-100 beats per minute)  What is your heart rate? 45 HR // BP 220/110       201/80  Do you have a log of your heart rate readings (document readings)? Staying between 45-50 HR   Do you have any other symptoms? No

## 2022-02-16 NOTE — Telephone Encounter (Signed)
Per secure chat- Pt will be sent to the ER for emergent evaluation. Richardson Landry- [facility]- notified and verbalized understanding.

## 2022-02-16 NOTE — ED Triage Notes (Signed)
Pt here from the kidney clinic. Pt was getting an injection today for kidneys and SBP was 220. HR was 45.

## 2022-02-16 NOTE — Progress Notes (Signed)
Patient pulse was 45 when first checked with BP >112 systolic. Manual BP 220/100. Spoke with Dr. Serita Grit office and Dr. Posey Pronto stated to hold Retacrit today and give clonidine.  Concerns with administering Clonidine with hsitory of takiding Mididrine to increase BP. Spoke with Cardiologist for patient Dr.Branch and he stated to not give clonidine and take to ED for evaluation. Patient taken to ED via wheelchair - report given to triage nurse.

## 2022-02-16 NOTE — ED Provider Notes (Signed)
[facility] EMERGENCY DEPARTMENT Provider Note   CSN: [id number] Arrival date & time: 02/16/22  1533     History {Add pertinent medical, surgical, social history, OB history to HPI:1} Chief Complaint  Patient presents with   Hypertension    Eric Brady is a 88 y.o. male.  Patient as above with significant medical history as below, including DM, HLD, CKD, who presents to the ED with complaint of abnormal vital signs.  Patient was at kidney specialist office, was prior to receive Retacrit injection when it was noted that he had elevated blood pressure.  Heart rate was also low.  Patient typically has sinus bradycardia around 50. He is asymptomatic, no chest pain or pressure, no ha or dib, no palpitations. Feels he is at his typical state currently, accompanied by son at bedside. Compliant with home medications. No n/v, no diarrhea or change to uop. Tolerating PO normally. Follows w/ Dr Posey Pronto, considering HD in near future 2/2 worsening ckd. On midodrine      Past Medical History:  Diagnosis Date   Anemia    Asthma    only as a child   Cancer ([place])    Prostate   Diabetes mellitus without complication ([place])    History of radiation therapy    Hyperlipidemia    Squamous cell carcinoma of skin 08/12/2019   in situ on right ear rim(CX3&EXC)    Past Surgical History:  Procedure Laterality Date   COLONOSCOPY N/A 09/12/2012   Procedure: COLONOSCOPY;  Surgeon: Rogene Houston, MD;  [facility];  Service: Endoscopy;  Laterality: N/A;  915   EYE SURGERY     LUMBAR LAMINECTOMY/DECOMPRESSION MICRODISCECTOMY Right 10/26/2020   Procedure: Microdiscectomy - Lumbar Three-Four Right;  Surgeon: Kary Kos, MD;  Location: [place];  Service: Neurosurgery;  Laterality: Right;  3C   ORIF ANKLE FRACTURE Right    YAG LASER APPLICATION Left 6/96/2952   Procedure: YAG LASER APPLICATION;  Surgeon: Williams Che, MD;  Location: [facility];  Service: Ophthalmology;  Laterality: Left;      Hard of hearing  The history is provided by the patient. No language interpreter was used.  Hypertension Pertinent negatives include no chest pain, no abdominal pain, no headaches and no shortness of breath.       Home Medications Prior to Admission medications   Medication Sig Start Date End Date Taking? Authorizing Provider  sodium zirconium cyclosilicate (LOKELMA) 10 g PACK packet Take 10 g by mouth every other day for 3 doses. 02/18/22 02/23/22 Yes [name], DO  acetaminophen (TYLENOL) 650 MG CR tablet Take 650 mg by mouth in the morning and at bedtime. Takes two tablets    [provider]  aspirin EC 81 MG tablet Take 81 mg by mouth daily. Swallow whole.    [provider]  atorvastatin (LIPITOR) 10 MG tablet Take 10 mg by mouth every evening.    [provider]  calcium-vitamin D (OSCAL WITH D) 500-200 MG-UNIT tablet Take 2 tablets by mouth daily.     [provider]  cholecalciferol (VITAMIN D3) 25 MCG (1000 UNIT) tablet Take 1,000 Units by mouth daily.    [provider]  clobetasol (OLUX) 0.05 % topical foam Apply topically 2 (two) times daily. APPLY TO THE SCALP TWICE DAILY FOR 1 MONTH 05/05/21   [name], MD  cyanocobalamin (,VITAMIN B-12,) 1000 MCG/ML injection Inject 1,000 mcg into the muscle every 30 (thirty) days.    [provider]  cyclobenzaprine (FLEXERIL) 10 MG tablet Take 1 tablet (10 mg total) by mouth 3 (three) times daily as needed for muscle spasms. Patient not taking: Reported on 01/19/2022 10/27/20   Kary Kos, MD  EPINEPHrine (EPIPEN 2-PAK) 0.3 mg/0.3 mL IJ SOAJ injection Inject 0.3 mg into the muscle once as needed for anaphylaxis. 12/18/19   [name], MD  ferrous sulfate 325 (65 FE) MG tablet     [provider]  fludrocortisone (FLORINEF) 0.1 MG tablet Take 2 tablets (0.2 mg total) by mouth daily. Patient not taking: Reported on 01/19/2022 09/15/20   [name], MD   HYDROcodone-acetaminophen Criselda Peaches) 5-325 mg TABS tablet Take one tablet by mouth every six hours as needed for pain Patient not taking: Reported on 01/19/2022 08/09/20   Sponseller, [name], PA-C  metFORMIN (GLUCOPHAGE-XR) 500 MG 24 hr tablet Take 1,000 mg by mouth every evening. Patient not taking: Reported on 01/19/2022 01/28/15   [provider]  midodrine (PROAMATINE) 10 MG tablet Take 10 mg by mouth 3 (three) times daily.    [provider]  Multiple Vitamin (MULTIVITAMIN) capsule Take 1 capsule by mouth daily.    [provider]  [facility] VERIO test strip 1 each daily. 08/10/19   [provider]  oxyCODONE 10 MG TABS Take 1 tablet (10 mg total) by mouth every 6 (six) hours as needed for severe pain ((score 7 to 10)). Patient not taking: Reported on 01/19/2022 10/27/20   Kary Kos, MD  pioglitazone (ACTOS) 15 MG tablet Take 15 mg by mouth every evening. 12/06/19   [provider]  sodium bicarbonate 650 MG tablet Take 650 mg by mouth 3 (three) times daily. 12/30/21   [provider]  traMADol (ULTRAM) 50 MG tablet Take 1 tablet (50 mg total) by mouth every 6 (six) hours as needed. Patient not taking: Reported on 01/19/2022 08/09/20   Sponseller, [name], PA-C  VELTASSA 8.4 g packet 8.4 g. 12/29/21   [provider]      Allergies    Patient has no known allergies.    Review of Systems   Review of Systems  Constitutional:  Negative for chills and fever.  HENT:  Negative for facial swelling and trouble swallowing.   Eyes:  Negative for photophobia and visual disturbance.  Respiratory:  Negative for cough and shortness of breath.   Cardiovascular:  Negative for chest pain and palpitations.  Gastrointestinal:  Negative for abdominal pain, nausea and vomiting.  Endocrine: Negative for polydipsia and polyuria.  Genitourinary:  Negative for difficulty urinating and hematuria.  Musculoskeletal:  Negative for gait problem and joint  swelling.  Skin:  Negative for pallor and rash.  Neurological:  Negative for syncope and headaches.  Psychiatric/Behavioral:  Negative for agitation and confusion.     Physical Exam Updated Vital Signs BP (!) 165/85   Pulse (!) 132   Temp (!) 97.5 F (36.4 C) (Oral)   Resp 19   Ht 6' (1.829 m)   Wt 82.6 kg   SpO2 91%   BMI 24.68 kg/m  Physical Exam Vitals and nursing note reviewed.  Constitutional:      General: He is not in acute distress.    Appearance: He is well-developed.  HENT:     Head: Normocephalic and atraumatic.     Right Ear: External ear normal.     Left Ear: External ear normal.     Mouth/Throat:     Mouth: Mucous membranes are moist.  Eyes:  General: No scleral icterus. Cardiovascular:     Rate and Rhythm: Regular rhythm. Bradycardia present.     Pulses: Normal pulses.     Heart sounds: Normal heart sounds.  Pulmonary:     Effort: Pulmonary effort is normal. No respiratory distress.     Breath sounds: Normal breath sounds.  Abdominal:     General: Abdomen is flat.     Palpations: Abdomen is soft.     Tenderness: There is no abdominal tenderness.  Musculoskeletal:        General: Normal range of motion.     Cervical back: Normal range of motion.     Right lower leg: No edema.     Left lower leg: No edema.  Skin:    General: Skin is warm and dry.     Capillary Refill: Capillary refill takes less than 2 seconds.  Neurological:     Mental Status: He is alert and oriented to person, place, and time.  Psychiatric:        Mood and Affect: Mood normal.        Behavior: Behavior normal.     ED Results / Procedures / Treatments   Labs (all labs ordered are listed, but only abnormal results are displayed) Labs Reviewed  CBC WITH DIFFERENTIAL/PLATELET - Abnormal; Notable for the following components:      Result Value   RBC 3.75 (*)    Hemoglobin 10.7 (*)    HCT 35.6 (*)    RDW 16.4 (*)    All other components within normal limits   COMPREHENSIVE METABOLIC PANEL - Abnormal; Notable for the following components:   Potassium 5.5 (*)    Chloride 112 (*)    CO2 21 (*)    Glucose, Bld 112 (*)    BUN 58 (*)    Creatinine, Ser 4.24 (*)    Calcium 8.8 (*)    GFR, Estimated 13 (*)    All other components within normal limits  MAGNESIUM  TSH    EKG EKG Interpretation  Date/Time:  Wednesday February 16 2022 16:20:08 EST Ventricular Rate:  47 PR Interval:  182 QRS Duration: 101 QT Interval:  495 QTC Calculation: 438 R Axis:   79 Text Interpretation: Sinus bradycardia Minimal ST elevation, inferior leads Confirmed by Wynona Dove ([id number]) on 02/16/2022 4:24:36 PM  Radiology No results found.  Procedures Procedures  {Document cardiac monitor, telemetry assessment procedure when appropriate:1}  Medications Ordered in ED Medications  hydrALAZINE (APRESOLINE) injection 5 mg (0 mg Intravenous Hold 02/16/22 1912)  sodium chloride 0.9 % bolus 1,000 mL (0 mLs Intravenous Stopped 02/16/22 1819)  sodium zirconium cyclosilicate (LOKELMA) packet 10 g (10 g Oral Given 02/16/22 1747)  hydrALAZINE (APRESOLINE) injection 5 mg (5 mg Intravenous Given 02/16/22 1747)    ED Course/ Medical Decision Making/ A&P Clinical Course as of 02/16/22 2041  Wed Feb 16, 2022  2036 Spoke with nephro Dr patel regarding labs from today (elev K and elev Cr) will follow up with him in the office, considering starting dialysis in the near future. Give lokelma rx [SG]    Clinical Course User Index [SG] [name], DO                           Medical Decision Making Amount and/or Complexity of Data Reviewed Labs: ordered.  Risk Prescription drug management.   This patient presents to the ED with chief complaint(s) of *** with pertinent past medical history  of *** which further complicates the presenting complaint. The complaint involves an extensive differential diagnosis and also carries with it a high risk of complications and  morbidity.    The differential diagnosis includes but not limited to ***. Serious etiologies were considered.   The initial plan is to ***   Additional history obtained: Additional history obtained from {additional history:26846} Records reviewed {records:26847}  Independent labs interpretation:  The following labs were independently interpreted: ***  Independent visualization of imaging: - I independently visualized the following imaging with scope of interpretation limited to determining acute life threatening conditions related to emergency care: ***, which revealed ***  Cardiac monitoring was reviewed and interpreted by myself which shows ***  Treatment and Reassessment: ***  Consultation: - Consulted or discussed management/test interpretation w/ external professional: ***  Consideration for admission or further workup: Admission was considered ***  Social Determinants of health: Social History   Tobacco Use   Smoking status: Former    Types: Cigarettes    Quit date: 12/18/1990    Years since quitting: 31.1   Smokeless tobacco: Never  Vaping Use   Vaping Use: Never used  Substance Use Topics   Alcohol use: No   Drug use: Never      {Document critical care time when appropriate:1} {Document review of labs and clinical decision tools ie heart score, Chads2Vasc2 etc:1}  {Document your independent review of radiology images, and any outside records:1} {Document your discussion with family members, caretakers, and with consultants:1} {Document social determinants of health affecting pt's care:1} {Document your decision making why or why not admission, treatments were needed:1} Final Clinical Impression(s) / ED Diagnoses Final diagnoses:  Elevated blood pressure reading  Chronic kidney disease, unspecified CKD stage  Serum potassium elevated    Rx / DC Orders ED Discharge Orders          Ordered    sodium zirconium cyclosilicate (LOKELMA) 10 g PACK packet   Every other day        02/16/22 2040

## 2022-02-16 NOTE — Discharge Instructions (Addendum)
It was a pleasure caring for you today in the emergency department.  Please take lokelma every other day starting 11/17; Dr Serita Grit office will call tomorrow to follow up on today.   Please follow up with Dr Posey Pronto in regards to midodrine dosing.   Please return to the emergency department for any worsening or worrisome symptoms.

## 2022-02-22 ENCOUNTER — Other Ambulatory Visit: Payer: Self-pay

## 2022-02-23 ENCOUNTER — Encounter: Payer: Medicare Other | Attending: Cardiology | Admitting: Cardiology

## 2022-02-23 ENCOUNTER — Encounter: Payer: Self-pay | Admitting: Cardiology

## 2022-02-23 ENCOUNTER — Encounter (HOSPITAL_COMMUNITY)
Admission: RE | Admit: 2022-02-23 | Discharge: 2022-02-23 | Disposition: A | Discharge status: Home / Self Care | Payer: Medicare Other | Source: Ambulatory Visit | Attending: Nephrology | Admitting: Nephrology

## 2022-02-23 VITALS — BP 105/55 | HR 64 | Ht 70.0 in | Wt 187.0 lb

## 2022-02-23 VITALS — BP 127/69 | HR 57 | Temp 97.7°F | Resp 20

## 2022-02-23 DIAGNOSIS — D631 Anemia in chronic kidney disease: Secondary | ICD-10-CM | POA: Diagnosis not present

## 2022-02-23 DIAGNOSIS — I951 Orthostatic hypotension: Secondary | ICD-10-CM

## 2022-02-23 DIAGNOSIS — R001 Bradycardia, unspecified: Secondary | ICD-10-CM | POA: Diagnosis not present

## 2022-02-23 DIAGNOSIS — E1129 Type 2 diabetes mellitus with other diabetic kidney complication: Secondary | ICD-10-CM | POA: Diagnosis not present

## 2022-02-23 DIAGNOSIS — N184 Chronic kidney disease, stage 4 (severe): Secondary | ICD-10-CM | POA: Diagnosis not present

## 2022-02-23 LAB — POCT HEMOGLOBIN-HEMACUE: Hemoglobin: 10.1 g/dL — ABNORMAL LOW (ref 13.0–17.0)

## 2022-02-23 MED ORDER — CLONIDINE HCL 0.1 MG PO TABS: 0.1000 mg | ORAL_TABLET | ORAL | Status: DC | PRN

## 2022-02-23 MED ORDER — EPOETIN ALFA-EPBX 10000 UNIT/ML IJ SOLN
10000.0000 [IU] | Freq: Once | INTRAMUSCULAR | Status: AC
  Administered 2022-02-23: 10000 [IU] via SUBCUTANEOUS
  Filled 2022-02-23: qty 1

## 2022-02-23 NOTE — Patient Instructions (Signed)
Medication Instructions:  Your physician recommends that you continue on your current medications as directed. Please refer to the Current Medication list given to you today.   Labwork: None  Testing/Procedures: None  Follow-Up: Follow up with Dr. Branch in 6 months.   Any Other Special Instructions Will Be Listed Below (If Applicable).     If you need a refill on your cardiac medications before your next appointment, please call your pharmacy.  

## 2022-02-23 NOTE — Progress Notes (Signed)
Clinical Summary Eric Brady is a 88 y.o.male seen today for follow up of the following medical problems      1. Dizzy spells/Orthostatic hypotension - previously found to be severely orthostatic in clinic, SBP dropped 43 points with standing.  - has done well on midodrine and florinef   - compliant with florinef, midodrine - working to stay hydrated.  - mild infrequent dizziness   2. Prostate cancer - complete radiation, completed hormone therapy.      3. Bradycardia Mild sinus brady, chronic for several years.  - 03/2019 monitor AV HR 52, reported symptoms correlated with SR and mild sinus brady   - ER visit 02/16/22 wit high bp, bradycardia mid 40s. EKG shows sinus brady 46 - occasional dizzines, mild and short in duration.     4. CKD - followed by nephrology      Past Medical History:  Diagnosis Date   Anemia    Asthma    only as a child   Cancer ([place])    Prostate   Diabetes mellitus without complication ([place])    History of radiation therapy    Hyperlipidemia    Squamous cell carcinoma of skin 08/12/2019   in situ on right ear rim(CX3&EXC)     No Known Allergies   Current Outpatient Medications  Medication Sig Dispense Refill   acetaminophen (TYLENOL) 650 MG CR tablet Take 650 mg by mouth in the morning and at bedtime. Takes two tablets     aspirin EC 81 MG tablet Take 81 mg by mouth daily. Swallow whole.     atorvastatin (LIPITOR) 10 MG tablet Take 10 mg by mouth every evening.     calcium-vitamin D (OSCAL WITH D) 500-200 MG-UNIT tablet Take 2 tablets by mouth daily.      cholecalciferol (VITAMIN D3) 25 MCG (1000 UNIT) tablet Take 1,000 Units by mouth daily.     clobetasol (OLUX) 0.05 % topical foam Apply topically 2 (two) times daily. APPLY TO THE SCALP TWICE DAILY FOR 1 MONTH 50 g 2   cyanocobalamin (,VITAMIN B-12,) 1000 MCG/ML injection Inject 1,000 mcg into the muscle every 30 (thirty) days.     cyclobenzaprine (FLEXERIL) 10 MG tablet Take  1 tablet (10 mg total) by mouth 3 (three) times daily as needed for muscle spasms. 30 tablet 0   EPINEPHrine (EPIPEN 2-PAK) 0.3 mg/0.3 mL IJ SOAJ injection Inject 0.3 mg into the muscle once as needed for anaphylaxis. 2 each 1   ferrous sulfate 325 (65 FE) MG tablet      fludrocortisone (FLORINEF) 0.1 MG tablet Take 2 tablets (0.2 mg total) by mouth daily. 180 tablet 3   HYDROcodone-acetaminophen (VICODIN) 5-325 mg TABS tablet Take one tablet by mouth every six hours as needed for pain 6 tablet 0   metFORMIN (GLUCOPHAGE-XR) 500 MG 24 hr tablet Take 1,000 mg by mouth every evening.  11   midodrine (PROAMATINE) 10 MG tablet Take 10 mg by mouth 3 (three) times daily.     Multiple Vitamin (MULTIVITAMIN) capsule Take 1 capsule by mouth daily.     ONETOUCH VERIO test strip 1 each daily.     oxyCODONE 10 MG TABS Take 1 tablet (10 mg total) by mouth every 6 (six) hours as needed for severe pain ((score 7 to 10)). 30 tablet 0   pioglitazone (ACTOS) 15 MG tablet Take 15 mg by mouth every evening.     sodium bicarbonate 650 MG tablet Take 650 mg by mouth 3 (  three) times daily.     sodium zirconium cyclosilicate (LOKELMA) 10 g PACK packet Take 10 g by mouth every other day for 3 doses. 3 packet 0   traMADol (ULTRAM) 50 MG tablet Take 1 tablet (50 mg total) by mouth every 6 (six) hours as needed. 12 tablet 0   VELTASSA 8.4 g packet 8.4 g.     No current facility-administered medications for this visit.     Past Surgical History:  Procedure Laterality Date   COLONOSCOPY N/A 09/12/2012   Procedure: COLONOSCOPY;  Surgeon: Rogene Houston, MD;  [facility];  Service: Endoscopy;  Laterality: N/A;  915   EYE SURGERY     LUMBAR LAMINECTOMY/DECOMPRESSION MICRODISCECTOMY Right 10/26/2020   Procedure: Microdiscectomy - Lumbar Three-Four Right;  Surgeon: Kary Kos, MD;  Location: [facility];  Service: Neurosurgery;  Laterality: Right;  3C   ORIF ANKLE FRACTURE Right    YAG LASER APPLICATION Left 2/94/7654    Procedure: YAG LASER APPLICATION;  Surgeon: Williams Che, MD;  Location: [facility];  Service: Ophthalmology;  Laterality: Left;     No Known Allergies    Family History  Problem Relation Age of Onset   Cancer Mother    Prostate cancer Brother      Social History Eric Brady reports that he quit smoking about 31 years ago. His smoking use included cigarettes. He has never used smokeless tobacco. Eric Brady reports no history of alcohol use.   Review of Systems CONSTITUTIONAL: No weight loss, fever, chills, weakness or fatigue.  HEENT: Eyes: No visual loss, blurred vision, double vision or yellow sclerae.No hearing loss, sneezing, congestion, runny nose or sore throat.  SKIN: No rash or itching.  CARDIOVASCULAR: per hpi RESPIRATORY: No shortness of breath, cough or sputum.  GASTROINTESTINAL: No anorexia, nausea, vomiting or diarrhea. No abdominal pain or blood.  GENITOURINARY: No burning on urination, no polyuria NEUROLOGICAL: No headache, dizziness, syncope, paralysis, ataxia, numbness or tingling in the extremities. No change in bowel or bladder control.  MUSCULOSKELETAL: No muscle, back pain, joint pain or stiffness.  LYMPHATICS: No enlarged nodes. No history of splenectomy.  PSYCHIATRIC: No history of depression or anxiety.  ENDOCRINOLOGIC: No reports of sweating, cold or heat intolerance. No polyuria or polydipsia.  Marland Kitchen   Physical Examination Vitals:   02/23/22 1011 02/23/22 1103  BP: (!) 90/46 (!) 105/55  Pulse: 64    Filed Weights   02/23/22 1011  Weight: 187 lb (84.8 kg)    Gen: resting comfortably, no acute distress HEENT: no scleral icterus, pupils equal round and reactive, no palptable cervical adenopathy,  CV: RRR, no m/r/g, no jvd Resp: Clear to auscultation bilaterally GI: abdomen is soft, non-tender, non-distended, normal bowel sounds, no hepatosplenomegaly MSK: extremities are warm, no edema.  Skin: warm, no rash Neuro:  no focal  deficits Psych: appropriate affect   Diagnostic Studies   02/2018 echo Study Conclusions   - Left ventricle: The cavity size was normal. Systolic function was   normal. The estimated ejection fraction was in the range of 60%   to 65%. Wall motion was normal; there were no regional wall   motion abnormalities. Left ventricular diastolic function   parameters were normal. - Aortic valve: There was very mild stenosis. Valve area (Vmax):   2.29 cm^2. - Mitral valve: Moderately calcified annulus. Moderately thickened   leaflets . - Left atrium: The atrium was mildly dilated. - Atrial septum: No defect or patent foramen ovale was identified.  Assessment and Plan  1. Dizziness/Orhotstatic hypotension - overall doing well on midodrine and florinef. Bp's somewhat labile high and low at times but overall acceptable range given severe orthostatic hypotension - continue current meds  2. Bradycardia - chronic and asymptoamtic, continue to monitor     [name], M.D.,

## 2022-02-23 NOTE — Progress Notes (Signed)
Diagnosis: Anemia in Chronic Kidney Disease  Provider:  Ihor Dow MD  Procedure: Injection  Retacrit (epoetin alfa-epbx), Dose: 10000 Units, Site: subcutaneous, Number of injections: 1   HGB 10.1  Post Care: Patient declined observation  Discharge: Condition: Good, Destination: Home . AVS provided to patient.   Performed by:  Baxter Hire, RN

## 2022-02-28 DIAGNOSIS — R7309 Other abnormal glucose: Secondary | ICD-10-CM | POA: Diagnosis not present

## 2022-02-28 DIAGNOSIS — I951 Orthostatic hypotension: Secondary | ICD-10-CM | POA: Diagnosis not present

## 2022-02-28 DIAGNOSIS — E1122 Type 2 diabetes mellitus with diabetic chronic kidney disease: Secondary | ICD-10-CM | POA: Diagnosis not present

## 2022-02-28 DIAGNOSIS — M9971 Connective tissue and disc stenosis of intervertebral foramina of cervical region: Secondary | ICD-10-CM | POA: Diagnosis not present

## 2022-03-02 ENCOUNTER — Encounter (HOSPITAL_COMMUNITY): Payer: Medicare Other

## 2022-03-04 DIAGNOSIS — N2581 Secondary hyperparathyroidism of renal origin: Secondary | ICD-10-CM | POA: Diagnosis not present

## 2022-03-04 DIAGNOSIS — I951 Orthostatic hypotension: Secondary | ICD-10-CM | POA: Diagnosis not present

## 2022-03-04 DIAGNOSIS — N184 Chronic kidney disease, stage 4 (severe): Secondary | ICD-10-CM | POA: Diagnosis not present

## 2022-03-04 DIAGNOSIS — D631 Anemia in chronic kidney disease: Secondary | ICD-10-CM | POA: Diagnosis not present

## 2022-03-09 ENCOUNTER — Encounter (HOSPITAL_COMMUNITY)
Admission: RE | Admit: 2022-03-09 | Discharge: 2022-03-09 | Disposition: A | Discharge status: Home / Self Care | Payer: Medicare Other | Source: Ambulatory Visit | Attending: Internal Medicine | Admitting: Internal Medicine

## 2022-03-09 VITALS — BP 118/64 | HR 56 | Temp 98.3°F | Resp 16

## 2022-03-09 DIAGNOSIS — D631 Anemia in chronic kidney disease: Secondary | ICD-10-CM | POA: Diagnosis not present

## 2022-03-09 DIAGNOSIS — D519 Vitamin B12 deficiency anemia, unspecified: Secondary | ICD-10-CM | POA: Diagnosis not present

## 2022-03-09 DIAGNOSIS — N184 Chronic kidney disease, stage 4 (severe): Secondary | ICD-10-CM | POA: Insufficient documentation

## 2022-03-09 LAB — POCT HEMOGLOBIN-HEMACUE: Hemoglobin: 11.5 g/dL — ABNORMAL LOW (ref 13.0–17.0)

## 2022-03-09 MED ORDER — EPOETIN ALFA-EPBX 10000 UNIT/ML IJ SOLN
10000.0000 [IU] | Freq: Once | INTRAMUSCULAR | Status: AC
  Administered 2022-03-09: 10000 [IU] via SUBCUTANEOUS
  Filled 2022-03-09: qty 1

## 2022-03-09 NOTE — Progress Notes (Signed)
Spoke to the patient's son who was on the call they have an appoint with Dr. Saintclair Halsted I reviewed the cervical spine findings severe spinal stenosis multilevel degenerative spondylosis  Patient is currently on Tylenol

## 2022-03-09 NOTE — Addendum Note (Signed)
Encounter addended by: Baxter Hire, RN on: 03/09/2022 1:11 PM  Actions taken: Charge Capture section accepted

## 2022-03-09 NOTE — Addendum Note (Signed)
Encounter addended by: Baxter Hire, RN on: 03/09/2022 11:53 AM  Actions taken: Order list changed, Therapy plan modified

## 2022-03-15 DIAGNOSIS — M542 Cervicalgia: Secondary | ICD-10-CM | POA: Diagnosis not present

## 2022-03-22 DIAGNOSIS — M5412 Radiculopathy, cervical region: Secondary | ICD-10-CM | POA: Diagnosis not present

## 2022-03-23 ENCOUNTER — Encounter (HOSPITAL_COMMUNITY)
Admission: RE | Admit: 2022-03-23 | Discharge: 2022-03-23 | Disposition: A | Discharge status: Home / Self Care | Payer: Medicare Other | Source: Ambulatory Visit | Attending: Internal Medicine | Admitting: Internal Medicine

## 2022-03-23 VITALS — BP 192/81 | HR 59 | Temp 97.5°F | Resp 16

## 2022-03-23 DIAGNOSIS — N184 Chronic kidney disease, stage 4 (severe): Secondary | ICD-10-CM | POA: Diagnosis not present

## 2022-03-23 DIAGNOSIS — D631 Anemia in chronic kidney disease: Secondary | ICD-10-CM

## 2022-03-23 LAB — CBC WITH DIFFERENTIAL/PLATELET
Abs Immature Granulocytes: 0.05 10*3/uL (ref 0.00–0.07)
Basophils Absolute: 0 10*3/uL (ref 0.0–0.1)
Basophils Relative: 0 %
Eosinophils Absolute: 0 10*3/uL (ref 0.0–0.5)
Eosinophils Relative: 0 %
HCT: 36.9 % — ABNORMAL LOW (ref 39.0–52.0)
Hemoglobin: 11.1 g/dL — ABNORMAL LOW (ref 13.0–17.0)
Immature Granulocytes: 0 %
Lymphocytes Relative: 8 %
Lymphs Abs: 0.9 10*3/uL (ref 0.7–4.0)
MCH: 28.8 pg (ref 26.0–34.0)
MCHC: 30.1 g/dL (ref 30.0–36.0)
MCV: 95.8 fL (ref 80.0–100.0)
Monocytes Absolute: 0.6 10*3/uL (ref 0.1–1.0)
Monocytes Relative: 5 %
Neutro Abs: 10.2 10*3/uL — ABNORMAL HIGH (ref 1.7–7.7)
Neutrophils Relative %: 87 %
Platelets: 182 10*3/uL (ref 150–400)
RBC: 3.85 MIL/uL — ABNORMAL LOW (ref 4.22–5.81)
RDW: 15.9 % — ABNORMAL HIGH (ref 11.5–15.5)
WBC: 11.7 10*3/uL — ABNORMAL HIGH (ref 4.0–10.5)
nRBC: 0 % (ref 0.0–0.2)

## 2022-03-23 LAB — IRON AND TIBC
Iron: 38 ug/dL — ABNORMAL LOW (ref 45–182)
Saturation Ratios: 12 % — ABNORMAL LOW (ref 17.9–39.5)
TIBC: 320 ug/dL (ref 250–450)
UIBC: 282 ug/dL

## 2022-03-23 LAB — POCT HEMOGLOBIN-HEMACUE: Hemoglobin: 11.6 g/dL — ABNORMAL LOW (ref 13.0–17.0)

## 2022-03-23 LAB — FERRITIN: Ferritin: 115 ng/mL (ref 24–336)

## 2022-03-23 MED ORDER — CLONIDINE HCL 0.1 MG PO TABS
0.1000 mg | ORAL_TABLET | ORAL | Status: DC | PRN
  Administered 2022-03-23: 0.1 mg via ORAL
  Filled 2022-03-23: qty 1

## 2022-03-23 MED ORDER — EPOETIN ALFA-EPBX 10000 UNIT/ML IJ SOLN: 10000.0000 [IU] | Freq: Once | INTRAMUSCULAR | Status: DC

## 2022-03-23 NOTE — Progress Notes (Signed)
Patient arrived in infusion center, BP taken with result of 215/81. Son states that he forgot to hold patient's Midodrine this morning. Spoke with Luetta Nutting, RN at Dr. Serita Grit office prior to administering PRN Clonidine. She stated to go ahead and give it, wait 30 minutes and recheck BP. If normal, patient could receive Retacrit (HGB was 11.6). If BP not back to normal, hold Retacrit and reschedule for next week. BP after 30 minutes was 192/81. Retacrit held and patient rescheduled for next Wednesday, December 27. Patient and son verbalized agreement wit this.

## 2022-03-30 ENCOUNTER — Other Ambulatory Visit: Payer: Self-pay

## 2022-03-30 ENCOUNTER — Encounter (HOSPITAL_COMMUNITY)
Admission: RE | Admit: 2022-03-30 | Discharge: 2022-03-30 | Disposition: A | Discharge status: Home / Self Care | Payer: Medicare Other | Source: Ambulatory Visit | Attending: Internal Medicine | Admitting: Internal Medicine

## 2022-03-30 ENCOUNTER — Emergency Department (HOSPITAL_COMMUNITY)
Admission: EM | Admit: 2022-03-30 | Discharge: 2022-03-30 | Discharge status: Against Medical Advice | Payer: Medicare Other | Attending: Emergency Medicine | Admitting: Emergency Medicine

## 2022-03-30 ENCOUNTER — Emergency Department (HOSPITAL_COMMUNITY): Payer: Medicare Other

## 2022-03-30 ENCOUNTER — Ambulatory Visit
Admission: EM | Admit: 2022-03-30 | Discharge: 2022-03-30 | Disposition: A | Discharge status: Home / Self Care | Payer: Medicare Other

## 2022-03-30 ENCOUNTER — Ambulatory Visit: Payer: Self-pay

## 2022-03-30 VITALS — BP 106/61 | HR 72 | Temp 98.0°F | Resp 24

## 2022-03-30 DIAGNOSIS — R5381 Other malaise: Secondary | ICD-10-CM | POA: Diagnosis not present

## 2022-03-30 DIAGNOSIS — Z20822 Contact with and (suspected) exposure to covid-19: Secondary | ICD-10-CM | POA: Diagnosis not present

## 2022-03-30 DIAGNOSIS — Z743 Need for continuous supervision: Secondary | ICD-10-CM | POA: Diagnosis not present

## 2022-03-30 DIAGNOSIS — E8809 Other disorders of plasma-protein metabolism, not elsewhere classified: Secondary | ICD-10-CM | POA: Diagnosis not present

## 2022-03-30 DIAGNOSIS — Z87891 Personal history of nicotine dependence: Secondary | ICD-10-CM | POA: Diagnosis not present

## 2022-03-30 DIAGNOSIS — E871 Hypo-osmolality and hyponatremia: Secondary | ICD-10-CM | POA: Diagnosis not present

## 2022-03-30 DIAGNOSIS — N134 Hydroureter: Secondary | ICD-10-CM | POA: Diagnosis not present

## 2022-03-30 DIAGNOSIS — Z5321 Procedure and treatment not carried out due to patient leaving prior to being seen by health care provider: Secondary | ICD-10-CM | POA: Insufficient documentation

## 2022-03-30 DIAGNOSIS — N189 Chronic kidney disease, unspecified: Secondary | ICD-10-CM | POA: Diagnosis not present

## 2022-03-30 DIAGNOSIS — R059 Cough, unspecified: Secondary | ICD-10-CM | POA: Insufficient documentation

## 2022-03-30 DIAGNOSIS — J9811 Atelectasis: Secondary | ICD-10-CM | POA: Diagnosis not present

## 2022-03-30 DIAGNOSIS — G9340 Encephalopathy, unspecified: Secondary | ICD-10-CM | POA: Diagnosis not present

## 2022-03-30 DIAGNOSIS — N185 Chronic kidney disease, stage 5: Secondary | ICD-10-CM | POA: Diagnosis not present

## 2022-03-30 DIAGNOSIS — R0602 Shortness of breath: Secondary | ICD-10-CM

## 2022-03-30 DIAGNOSIS — D510 Vitamin B12 deficiency anemia due to intrinsic factor deficiency: Secondary | ICD-10-CM | POA: Diagnosis not present

## 2022-03-30 DIAGNOSIS — R531 Weakness: Secondary | ICD-10-CM | POA: Diagnosis not present

## 2022-03-30 DIAGNOSIS — I2510 Atherosclerotic heart disease of native coronary artery without angina pectoris: Secondary | ICD-10-CM | POA: Diagnosis not present

## 2022-03-30 DIAGNOSIS — R0682 Tachypnea, not elsewhere classified: Secondary | ICD-10-CM | POA: Insufficient documentation

## 2022-03-30 DIAGNOSIS — Z7982 Long term (current) use of aspirin: Secondary | ICD-10-CM | POA: Diagnosis not present

## 2022-03-30 DIAGNOSIS — D72829 Elevated white blood cell count, unspecified: Secondary | ICD-10-CM | POA: Diagnosis not present

## 2022-03-30 DIAGNOSIS — R918 Other nonspecific abnormal finding of lung field: Secondary | ICD-10-CM | POA: Diagnosis not present

## 2022-03-30 DIAGNOSIS — N184 Chronic kidney disease, stage 4 (severe): Secondary | ICD-10-CM

## 2022-03-30 DIAGNOSIS — E785 Hyperlipidemia, unspecified: Secondary | ICD-10-CM | POA: Diagnosis not present

## 2022-03-30 DIAGNOSIS — G4700 Insomnia, unspecified: Secondary | ICD-10-CM | POA: Diagnosis not present

## 2022-03-30 DIAGNOSIS — Z8546 Personal history of malignant neoplasm of prostate: Secondary | ICD-10-CM | POA: Diagnosis not present

## 2022-03-30 DIAGNOSIS — N179 Acute kidney failure, unspecified: Secondary | ICD-10-CM | POA: Diagnosis not present

## 2022-03-30 DIAGNOSIS — E8729 Other acidosis: Secondary | ICD-10-CM | POA: Diagnosis not present

## 2022-03-30 DIAGNOSIS — Z794 Long term (current) use of insulin: Secondary | ICD-10-CM | POA: Diagnosis not present

## 2022-03-30 DIAGNOSIS — R0600 Dyspnea, unspecified: Secondary | ICD-10-CM | POA: Diagnosis not present

## 2022-03-30 DIAGNOSIS — E1122 Type 2 diabetes mellitus with diabetic chronic kidney disease: Secondary | ICD-10-CM | POA: Diagnosis not present

## 2022-03-30 DIAGNOSIS — D631 Anemia in chronic kidney disease: Secondary | ICD-10-CM | POA: Diagnosis not present

## 2022-03-30 DIAGNOSIS — R410 Disorientation, unspecified: Secondary | ICD-10-CM | POA: Diagnosis not present

## 2022-03-30 DIAGNOSIS — E1165 Type 2 diabetes mellitus with hyperglycemia: Secondary | ICD-10-CM | POA: Diagnosis not present

## 2022-03-30 DIAGNOSIS — I9589 Other hypotension: Secondary | ICD-10-CM | POA: Diagnosis not present

## 2022-03-30 DIAGNOSIS — J189 Pneumonia, unspecified organism: Secondary | ICD-10-CM | POA: Diagnosis not present

## 2022-03-30 DIAGNOSIS — N1330 Unspecified hydronephrosis: Secondary | ICD-10-CM | POA: Diagnosis not present

## 2022-03-30 DIAGNOSIS — F05 Delirium due to known physiological condition: Secondary | ICD-10-CM | POA: Diagnosis not present

## 2022-03-30 DIAGNOSIS — E875 Hyperkalemia: Secondary | ICD-10-CM | POA: Diagnosis not present

## 2022-03-30 LAB — RESP PANEL BY RT-PCR (RSV, FLU A&B, COVID)  RVPGX2
Influenza A by PCR: NEGATIVE
Influenza B by PCR: NEGATIVE
Resp Syncytial Virus by PCR: NEGATIVE
SARS Coronavirus 2 by RT PCR: NEGATIVE

## 2022-03-30 LAB — POCT HEMOGLOBIN-HEMACUE: Hemoglobin: 9.5 g/dL — ABNORMAL LOW (ref 13.0–17.0)

## 2022-03-30 MED ORDER — CLONIDINE HCL 0.1 MG PO TABS: 0.1000 mg | ORAL_TABLET | ORAL | Status: DC | PRN

## 2022-03-30 MED ORDER — EPOETIN ALFA-EPBX 10000 UNIT/ML IJ SOLN
10000.0000 [IU] | Freq: Once | INTRAMUSCULAR | Status: AC
  Administered 2022-03-30: 10000 [IU] via SUBCUTANEOUS

## 2022-03-30 NOTE — ED Triage Notes (Signed)
Pt accompanied by son, son states pt has had cough for 3 days and has gotten worse. Pts son is concerned that pt is developing pneumonia. Also reports that pt seems to be weaker than usual. Pt is tachypenic in triage, o2 97%. NAD

## 2022-03-30 NOTE — ED Triage Notes (Signed)
Pt. Presents to UC w/ c/o a productive cough, labored breathing, and a decrease in fluid consumption. Pt's children state his cold symptoms started 4 days ago but the labored breathing and wheezing started 2 days ago.

## 2022-03-30 NOTE — ED Provider Notes (Signed)
[facility]    CSN: [id number] Arrival date & time: 03/30/22  1349      History   Chief Complaint No chief complaint on file.   HPI Eric Brady is a 88 y.o. male.   HPI  Presents to urgent care accompanied by his children.  He complains of productive cough, labored breathing, decrease in fluid consumption recently.  They describe "cold" symptoms which started 4 days ago with labored breathing and wheezing starting 2 days ago.  Increased respiration rate of 22.  They deny active chronic pulmonary disorder but endorses "childhood asthma".  They brought him to the ED last night and he was negative COVID, RSV, flu and x-ray showing no focal consolidation but with right basilar atelectasis.  They brought him home without treatment because where there for multiple hours without any fluids and they wanted him to go home and get some rest.  They are trying to push fluids on him but believe he was without for at least a couple of days.  Gatorade bottle in clinic.  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   Prostate cancer ([place]) 12/21/2019   DM (diabetes mellitus) ([place]) 10/31/2018   HLD (hyperlipidemia) 10/31/2018   Hypotension 10/31/2018   Mild aortic stenosis 10/31/2018   Family history of malignant neoplasm of prostate 04/15/2016    Past Surgical History:  Procedure Laterality Date   COLONOSCOPY N/A 09/12/2012    Procedure: COLONOSCOPY;  Surgeon: Rogene Houston, MD;  [facility];  Service: Endoscopy;  Laterality: N/A;  915   EYE SURGERY     LUMBAR LAMINECTOMY/DECOMPRESSION MICRODISCECTOMY Right 10/26/2020   Procedure: Microdiscectomy - Lumbar Three-Four Right;  Surgeon: Kary Kos, MD;  Location: [facility];  Service: Neurosurgery;  Laterality: Right;  3C   ORIF ANKLE FRACTURE Right    YAG LASER APPLICATION Left 7/56/4332   Procedure: YAG LASER APPLICATION;  Surgeon: Williams Che, MD;  Location[facility];  Service: Ophthalmology;  Laterality: Left;       Home Medications    Prior to Admission medications   Medication Sig Start Date End Date Taking? Authorizing Provider  acetaminophen (TYLENOL) 650 MG CR tablet Take 650 mg by mouth in the morning and at bedtime. Takes two tablets    [provider]  aspirin EC 81 MG tablet Take 81 mg by mouth daily. Swallow whole.    [provider]  atorvastatin (LIPITOR) 10 MG tablet Take 10 mg by mouth every evening.    [provider]  calcium-vitamin D (OSCAL WITH D) 500-200 MG-UNIT tablet Take 2 tablets by mouth daily.     [provider]  cholecalciferol (VITAMIN D3) 25 MCG (1000 UNIT) tablet Take 1,000 Units by mouth daily.    [provider]  clobetasol (OLUX) 0.05 % topical foam Apply topically 2 (two) times daily. APPLY TO THE  SCALP TWICE DAILY FOR 1 MONTH 05/05/21   [name], MD  cyanocobalamin (,VITAMIN B-12,) 1000 MCG/ML injection Inject 1,000 mcg into the muscle every 30 (thirty) days.    [provider]  cyclobenzaprine (FLEXERIL) 10 MG tablet Take 1 tablet (10 mg total) by mouth 3 (three) times daily as needed for muscle spasms. 10/27/20   Kary Kos, MD  EPINEPHrine (EPIPEN 2-PAK) 0.3 mg/0.3 mL IJ SOAJ injection Inject 0.3 mg into the muscle once as needed for anaphylaxis. 12/18/19   [name], MD  ferrous sulfate 325 (65 FE) MG tablet     [provider]   fludrocortisone (FLORINEF) 0.1 MG tablet Take 2 tablets (0.2 mg total) by mouth daily. 09/15/20   [name], MD  HYDROcodone-acetaminophen (VICODIN) 5-325 mg TABS tablet Take one tablet by mouth every six hours as needed for pain 08/09/20   Sponseller, Rebekah R, PA-C  metFORMIN (GLUCOPHAGE-XR) 500 MG 24 hr tablet Take 1,000 mg by mouth every evening. 01/28/15   [provider]  midodrine (PROAMATINE) 10 MG tablet Take 10 mg by mouth 3 (three) times daily.    [provider]  Multiple Vitamin (MULTIVITAMIN) capsule Take 1 capsule by mouth daily.    [provider]  [facility] VERIO test strip 1 each daily. 08/10/19   [provider]  oxyCODONE 10 MG TABS Take 1 tablet (10 mg total) by mouth every 6 (six) hours as needed for severe pain ((score 7 to 10)). 10/27/20   Kary Kos, MD  pioglitazone (ACTOS) 15 MG tablet Take 15 mg by mouth every evening. 12/06/19   [provider]  sodium bicarbonate 650 MG tablet Take 650 mg by mouth 3 (three) times daily. 12/30/21   [provider]  traMADol (ULTRAM) 50 MG tablet Take 1 tablet (50 mg total) by mouth every 6 (six) hours as needed. 08/09/20   Sponseller, [name], PA-C  VELTASSA 8.4 g packet 8.4 g. 12/29/21   [provider]    Family History Family History  Problem Relation Age of Onset   Cancer Mother    Prostate cancer Brother     Social History Social History   Tobacco Use   Smoking status: Former    Types: Cigarettes    Quit date: 12/18/1990    Years since quitting: 31.3   Smokeless tobacco: Never  Vaping Use   Vaping Use: Never used  Substance Use Topics   Alcohol use: No   Drug use: Never     Allergies   Patient has no known allergies.   Review of Systems Review of Systems   Physical Exam Triage Vital Signs ED Triage Vitals  Enc Vitals Group     BP      Pulse      Resp      Temp      Temp src      SpO2      Weight      Height      Head Circumference       Peak Flow      Pain Score      Pain Loc      Pain Edu?      Excl. in [place]?    No data found.  Updated Vital Signs There were no vitals taken for this visit.  Visual Acuity Right Eye Distance:   Left Eye Distance:   Bilateral Distance:    Right Eye Near:   Left Eye Near:  Bilateral Near:     Physical Exam Vitals reviewed.  Constitutional:      Appearance: Normal appearance.  Cardiovascular:     Rate and Rhythm: Normal rate and regular rhythm.     Pulses: Normal pulses.     Heart sounds: Normal heart sounds.  Pulmonary:     Effort: Accessory muscle usage present.     Breath sounds: Examination of the right-upper field reveals wheezing. Examination of the left-upper field reveals wheezing. Wheezing present.  Skin:    General: Skin is warm and dry.  Neurological:     General: No focal deficit present.     Mental Status: He is alert and oriented to person, place, and time.  Psychiatric:        Mood and Affect: Mood normal.        Behavior: Behavior normal.      UC Treatments / Results  Labs (all labs ordered are listed, but only abnormal results are displayed) Labs Reviewed - No data to display  EKG   Radiology DG Chest 2 View  Result Date: 03/30/2022 CLINICAL DATA:  Shortness of breath and cough EXAM: CHEST - 2 VIEW COMPARISON:  03/07/2020 FINDINGS: Stable cardiomediastinal silhouette. Aortic atherosclerotic calcification. Right basilar atelectasis. No focal consolidation, pleural effusion, or pneumothorax. No acute osseous abnormality. Azygous lobe. IMPRESSION: No active cardiopulmonary disease. Electronically Signed   By: Placido Sou M.D.   On: 03/30/2022 03:09    Procedures Procedures (including critical care time)  Medications Ordered in UC Medications - No data to display  Initial Impression / Assessment and Plan / UC Course  I have reviewed the triage vital signs and the nursing notes.  Pertinent labs & imaging results that were available  during my care of the patient were reviewed by me and considered in my medical decision making (see chart for details).   Patient is afebrile here without recent antipyretics. Satting 94% on room air, increased RR equals 22 with apparent increased work of breathing. Overall is toxic appearing, please dehydrated with reduced skin turgor. Pulmonary exam is remarkable for increased WOB with shallow breathes, accessory muscle use, expiratory wheezes.  Concern for the patient's increased work of breathing and likely dehydration.  Recommended transport to ED for fluid replenishment.  Administering albuterol via nebs with O2 clinic without significant improvement in symptoms. Expiratory wheezes still present, shallow breathing with accessory muscle use.  Advised to transfer care to ED/  Final Clinical Impressions(s) / UC Diagnoses   Final diagnoses:  None   Discharge Instructions   None    ED Prescriptions   None    PDMP not reviewed this encounter.   Rose Phi, [place] 03/30/22 1445

## 2022-03-30 NOTE — Discharge Instructions (Addendum)
Only advised to transport him to ED for evaluation.

## 2022-03-30 NOTE — Progress Notes (Signed)
Diagnosis: Anemia in Chronic Kidney Disease  Provider:  Ihor Dow MD  Procedure: Injection  Retacrit (epoetin alfa-epbx), Dose: 10000 Units, Site: subcutaneous, Number of injections: 1   HGB 9.6   Post Care: Patient declined observation  Discharge: Condition: [place], Destination: Home . AVS provided to patient.   Performed by:  Baxter Hire, RN

## 2022-04-06 ENCOUNTER — Encounter (HOSPITAL_COMMUNITY): Payer: Self-pay | Admitting: Nephrology

## 2022-04-06 ENCOUNTER — Encounter (HOSPITAL_COMMUNITY): Payer: Medicare Other

## 2022-04-08 DIAGNOSIS — E785 Hyperlipidemia, unspecified: Secondary | ICD-10-CM | POA: Diagnosis not present

## 2022-04-08 DIAGNOSIS — E1122 Type 2 diabetes mellitus with diabetic chronic kidney disease: Secondary | ICD-10-CM | POA: Diagnosis not present

## 2022-04-08 DIAGNOSIS — N134 Hydroureter: Secondary | ICD-10-CM | POA: Diagnosis not present

## 2022-04-08 DIAGNOSIS — E875 Hyperkalemia: Secondary | ICD-10-CM | POA: Diagnosis not present

## 2022-04-08 DIAGNOSIS — G4700 Insomnia, unspecified: Secondary | ICD-10-CM | POA: Diagnosis not present

## 2022-04-08 DIAGNOSIS — Z743 Need for continuous supervision: Secondary | ICD-10-CM | POA: Diagnosis not present

## 2022-04-08 DIAGNOSIS — R531 Weakness: Secondary | ICD-10-CM | POA: Diagnosis not present

## 2022-04-08 DIAGNOSIS — N1330 Unspecified hydronephrosis: Secondary | ICD-10-CM | POA: Diagnosis not present

## 2022-04-08 DIAGNOSIS — Z8546 Personal history of malignant neoplasm of prostate: Secondary | ICD-10-CM | POA: Diagnosis not present

## 2022-04-08 DIAGNOSIS — N184 Chronic kidney disease, stage 4 (severe): Secondary | ICD-10-CM | POA: Diagnosis not present

## 2022-04-08 DIAGNOSIS — Z7982 Long term (current) use of aspirin: Secondary | ICD-10-CM | POA: Diagnosis not present

## 2022-04-08 DIAGNOSIS — I2510 Atherosclerotic heart disease of native coronary artery without angina pectoris: Secondary | ICD-10-CM | POA: Diagnosis not present

## 2022-04-08 DIAGNOSIS — J189 Pneumonia, unspecified organism: Secondary | ICD-10-CM | POA: Diagnosis not present

## 2022-04-08 DIAGNOSIS — R5381 Other malaise: Secondary | ICD-10-CM | POA: Diagnosis not present

## 2022-04-08 DIAGNOSIS — I951 Orthostatic hypotension: Secondary | ICD-10-CM | POA: Diagnosis not present

## 2022-04-08 DIAGNOSIS — R410 Disorientation, unspecified: Secondary | ICD-10-CM | POA: Diagnosis not present

## 2022-04-08 DIAGNOSIS — Z87891 Personal history of nicotine dependence: Secondary | ICD-10-CM | POA: Diagnosis not present

## 2022-04-08 DIAGNOSIS — R0600 Dyspnea, unspecified: Secondary | ICD-10-CM | POA: Diagnosis not present

## 2022-04-08 DIAGNOSIS — N185 Chronic kidney disease, stage 5: Secondary | ICD-10-CM | POA: Diagnosis not present

## 2022-04-08 DIAGNOSIS — D631 Anemia in chronic kidney disease: Secondary | ICD-10-CM | POA: Diagnosis not present

## 2022-04-08 DIAGNOSIS — I9589 Other hypotension: Secondary | ICD-10-CM | POA: Diagnosis not present

## 2022-04-08 DIAGNOSIS — Z794 Long term (current) use of insulin: Secondary | ICD-10-CM | POA: Diagnosis not present

## 2022-04-13 ENCOUNTER — Encounter (HOSPITAL_COMMUNITY): Admission: RE | Admit: 2022-04-13 | Payer: Medicare Other | Source: Ambulatory Visit

## 2022-04-13 DIAGNOSIS — I951 Orthostatic hypotension: Secondary | ICD-10-CM | POA: Diagnosis not present

## 2022-04-15 DIAGNOSIS — R5381 Other malaise: Secondary | ICD-10-CM | POA: Diagnosis not present

## 2022-04-15 DIAGNOSIS — J189 Pneumonia, unspecified organism: Secondary | ICD-10-CM | POA: Diagnosis not present

## 2022-04-15 DIAGNOSIS — R410 Disorientation, unspecified: Secondary | ICD-10-CM | POA: Diagnosis not present

## 2022-04-15 DIAGNOSIS — G4700 Insomnia, unspecified: Secondary | ICD-10-CM | POA: Diagnosis not present

## 2022-04-19 DIAGNOSIS — H9190 Unspecified hearing loss, unspecified ear: Secondary | ICD-10-CM | POA: Diagnosis not present

## 2022-04-19 DIAGNOSIS — I2510 Atherosclerotic heart disease of native coronary artery without angina pectoris: Secondary | ICD-10-CM | POA: Diagnosis not present

## 2022-04-19 DIAGNOSIS — E1122 Type 2 diabetes mellitus with diabetic chronic kidney disease: Secondary | ICD-10-CM | POA: Diagnosis not present

## 2022-04-19 DIAGNOSIS — E872 Acidosis, unspecified: Secondary | ICD-10-CM | POA: Diagnosis not present

## 2022-04-19 DIAGNOSIS — G4700 Insomnia, unspecified: Secondary | ICD-10-CM | POA: Diagnosis not present

## 2022-04-19 DIAGNOSIS — I120 Hypertensive chronic kidney disease with stage 5 chronic kidney disease or end stage renal disease: Secondary | ICD-10-CM | POA: Diagnosis not present

## 2022-04-19 DIAGNOSIS — N1330 Unspecified hydronephrosis: Secondary | ICD-10-CM | POA: Diagnosis not present

## 2022-04-19 DIAGNOSIS — D519 Vitamin B12 deficiency anemia, unspecified: Secondary | ICD-10-CM | POA: Diagnosis not present

## 2022-04-19 DIAGNOSIS — M47812 Spondylosis without myelopathy or radiculopathy, cervical region: Secondary | ICD-10-CM | POA: Diagnosis not present

## 2022-04-19 DIAGNOSIS — Z87891 Personal history of nicotine dependence: Secondary | ICD-10-CM | POA: Diagnosis not present

## 2022-04-19 DIAGNOSIS — Z8701 Personal history of pneumonia (recurrent): Secondary | ICD-10-CM | POA: Diagnosis not present

## 2022-04-19 DIAGNOSIS — Z7982 Long term (current) use of aspirin: Secondary | ICD-10-CM | POA: Diagnosis not present

## 2022-04-19 DIAGNOSIS — I951 Orthostatic hypotension: Secondary | ICD-10-CM | POA: Diagnosis not present

## 2022-04-19 DIAGNOSIS — D510 Vitamin B12 deficiency anemia due to intrinsic factor deficiency: Secondary | ICD-10-CM | POA: Diagnosis not present

## 2022-04-19 DIAGNOSIS — N185 Chronic kidney disease, stage 5: Secondary | ICD-10-CM | POA: Diagnosis not present

## 2022-04-19 DIAGNOSIS — D631 Anemia in chronic kidney disease: Secondary | ICD-10-CM | POA: Diagnosis not present

## 2022-04-19 DIAGNOSIS — E785 Hyperlipidemia, unspecified: Secondary | ICD-10-CM | POA: Diagnosis not present

## 2022-04-20 ENCOUNTER — Encounter (HOSPITAL_COMMUNITY): Payer: Medicare Other

## 2022-04-21 ENCOUNTER — Encounter (HOSPITAL_COMMUNITY): Payer: Self-pay | Admitting: Nephrology

## 2022-04-21 ENCOUNTER — Encounter (HOSPITAL_COMMUNITY)
Admission: RE | Admit: 2022-04-21 | Discharge: 2022-04-21 | Disposition: A | Discharge status: Home / Self Care | Payer: Medicare Other | Source: Ambulatory Visit | Attending: Nephrology | Admitting: Nephrology

## 2022-04-21 VITALS — BP 134/68 | HR 51 | Temp 97.7°F | Resp 16

## 2022-04-21 DIAGNOSIS — N184 Chronic kidney disease, stage 4 (severe): Secondary | ICD-10-CM | POA: Diagnosis not present

## 2022-04-21 DIAGNOSIS — D631 Anemia in chronic kidney disease: Secondary | ICD-10-CM | POA: Insufficient documentation

## 2022-04-21 LAB — CBC WITH DIFFERENTIAL/PLATELET
Abs Immature Granulocytes: 0.01 10*3/uL (ref 0.00–0.07)
Basophils Absolute: 0.1 10*3/uL (ref 0.0–0.1)
Basophils Relative: 1 %
Eosinophils Absolute: 0.3 10*3/uL (ref 0.0–0.5)
Eosinophils Relative: 5 %
HCT: 35.2 % — ABNORMAL LOW (ref 39.0–52.0)
Hemoglobin: 10.3 g/dL — ABNORMAL LOW (ref 13.0–17.0)
Immature Granulocytes: 0 %
Lymphocytes Relative: 29 %
Lymphs Abs: 1.7 10*3/uL (ref 0.7–4.0)
MCH: 28.7 pg (ref 26.0–34.0)
MCHC: 29.3 g/dL — ABNORMAL LOW (ref 30.0–36.0)
MCV: 98.1 fL (ref 80.0–100.0)
Monocytes Absolute: 0.4 10*3/uL (ref 0.1–1.0)
Monocytes Relative: 6 %
Neutro Abs: 3.4 10*3/uL (ref 1.7–7.7)
Neutrophils Relative %: 59 %
Platelets: 184 10*3/uL (ref 150–400)
RBC: 3.59 MIL/uL — ABNORMAL LOW (ref 4.22–5.81)
RDW: 13.7 % (ref 11.5–15.5)
WBC: 5.8 10*3/uL (ref 4.0–10.5)
nRBC: 0 % (ref 0.0–0.2)

## 2022-04-21 LAB — IRON AND TIBC
Iron: 98 ug/dL (ref 45–182)
Saturation Ratios: 33 % (ref 17.9–39.5)
TIBC: 293 ug/dL (ref 250–450)
UIBC: 195 ug/dL

## 2022-04-21 LAB — FERRITIN: Ferritin: 155 ng/mL (ref 24–336)

## 2022-04-21 LAB — POCT HEMOGLOBIN-HEMACUE: Hemoglobin: 9.9 g/dL — ABNORMAL LOW (ref 13.0–17.0)

## 2022-04-21 MED ORDER — EPOETIN ALFA-EPBX 10000 UNIT/ML IJ SOLN
10000.0000 [IU] | Freq: Once | INTRAMUSCULAR | Status: AC
  Administered 2022-04-21: 10000 [IU] via SUBCUTANEOUS

## 2022-04-21 MED ORDER — ACETAMINOPHEN 325 MG PO TABS
650.0000 mg | ORAL_TABLET | Freq: Once | ORAL | Status: DC
  Filled 2022-04-21: qty 2

## 2022-04-21 MED ORDER — SODIUM CHLORIDE 0.9 % IV SOLN
510.0000 mg | Freq: Once | INTRAVENOUS | Status: AC
  Administered 2022-04-21: 510 mg via INTRAVENOUS
  Filled 2022-04-21: qty 510

## 2022-04-21 MED ORDER — DIPHENHYDRAMINE HCL 25 MG PO CAPS
25.0000 mg | ORAL_CAPSULE | Freq: Once | ORAL | Status: AC
  Administered 2022-04-21: 25 mg via ORAL
  Filled 2022-04-21: qty 1

## 2022-04-21 MED ORDER — CLONIDINE HCL 0.1 MG PO TABS: 0.1000 mg | ORAL_TABLET | ORAL | Status: DC | PRN

## 2022-04-21 NOTE — Progress Notes (Signed)
Diagnosis: Anemia in Chronic Kidney Disease  Provider:  Ihor Dow MD  Procedure: Infusion  IV Type: Peripheral, IV Location: L Hand  Feraheme (Ferumoxytol), Dose: 510 mg  Infusion Start Time: 7106  Infusion Stop Time: 1215  Post Infusion IV Care: Observation period completed and Peripheral IV Discontinued  Discharge: Condition: Good, Destination: Home . AVS provided to patient.   Performed by:  Hughie Closs, RN      Patient also received Retacrit injection 10,000Units for a Hgb of 9.9. Patient tolerated injection well.

## 2022-04-21 NOTE — Progress Notes (Signed)
Patient IV to right AC infiltrated during treatment. Jena Gauss, DNP made aware. Warm heat pack applied to patients arm and arm elevated. Pt denies pain. New IV in place and infusion restarted.

## 2022-04-22 DIAGNOSIS — I2510 Atherosclerotic heart disease of native coronary artery without angina pectoris: Secondary | ICD-10-CM | POA: Diagnosis not present

## 2022-04-22 DIAGNOSIS — E872 Acidosis, unspecified: Secondary | ICD-10-CM | POA: Diagnosis not present

## 2022-04-22 DIAGNOSIS — I951 Orthostatic hypotension: Secondary | ICD-10-CM | POA: Diagnosis not present

## 2022-04-22 DIAGNOSIS — N1330 Unspecified hydronephrosis: Secondary | ICD-10-CM | POA: Diagnosis not present

## 2022-04-22 DIAGNOSIS — Z8701 Personal history of pneumonia (recurrent): Secondary | ICD-10-CM | POA: Diagnosis not present

## 2022-04-22 DIAGNOSIS — N185 Chronic kidney disease, stage 5: Secondary | ICD-10-CM | POA: Diagnosis not present

## 2022-04-22 DIAGNOSIS — E785 Hyperlipidemia, unspecified: Secondary | ICD-10-CM | POA: Diagnosis not present

## 2022-04-22 DIAGNOSIS — Z87891 Personal history of nicotine dependence: Secondary | ICD-10-CM | POA: Diagnosis not present

## 2022-04-22 DIAGNOSIS — M47812 Spondylosis without myelopathy or radiculopathy, cervical region: Secondary | ICD-10-CM | POA: Diagnosis not present

## 2022-04-22 DIAGNOSIS — I120 Hypertensive chronic kidney disease with stage 5 chronic kidney disease or end stage renal disease: Secondary | ICD-10-CM | POA: Diagnosis not present

## 2022-04-22 DIAGNOSIS — E1122 Type 2 diabetes mellitus with diabetic chronic kidney disease: Secondary | ICD-10-CM | POA: Diagnosis not present

## 2022-04-22 DIAGNOSIS — D631 Anemia in chronic kidney disease: Secondary | ICD-10-CM | POA: Diagnosis not present

## 2022-04-22 DIAGNOSIS — D510 Vitamin B12 deficiency anemia due to intrinsic factor deficiency: Secondary | ICD-10-CM | POA: Diagnosis not present

## 2022-04-22 DIAGNOSIS — G4700 Insomnia, unspecified: Secondary | ICD-10-CM | POA: Diagnosis not present

## 2022-04-22 DIAGNOSIS — Z7982 Long term (current) use of aspirin: Secondary | ICD-10-CM | POA: Diagnosis not present

## 2022-04-22 DIAGNOSIS — H9190 Unspecified hearing loss, unspecified ear: Secondary | ICD-10-CM | POA: Diagnosis not present

## 2022-04-27 DIAGNOSIS — D631 Anemia in chronic kidney disease: Secondary | ICD-10-CM | POA: Diagnosis not present

## 2022-04-27 DIAGNOSIS — Z8701 Personal history of pneumonia (recurrent): Secondary | ICD-10-CM | POA: Diagnosis not present

## 2022-04-27 DIAGNOSIS — D510 Vitamin B12 deficiency anemia due to intrinsic factor deficiency: Secondary | ICD-10-CM | POA: Diagnosis not present

## 2022-04-27 DIAGNOSIS — I120 Hypertensive chronic kidney disease with stage 5 chronic kidney disease or end stage renal disease: Secondary | ICD-10-CM | POA: Diagnosis not present

## 2022-04-27 DIAGNOSIS — M47812 Spondylosis without myelopathy or radiculopathy, cervical region: Secondary | ICD-10-CM | POA: Diagnosis not present

## 2022-04-27 DIAGNOSIS — I951 Orthostatic hypotension: Secondary | ICD-10-CM | POA: Diagnosis not present

## 2022-04-27 DIAGNOSIS — N185 Chronic kidney disease, stage 5: Secondary | ICD-10-CM | POA: Diagnosis not present

## 2022-04-27 DIAGNOSIS — I2510 Atherosclerotic heart disease of native coronary artery without angina pectoris: Secondary | ICD-10-CM | POA: Diagnosis not present

## 2022-04-27 DIAGNOSIS — H9190 Unspecified hearing loss, unspecified ear: Secondary | ICD-10-CM | POA: Diagnosis not present

## 2022-04-27 DIAGNOSIS — G4700 Insomnia, unspecified: Secondary | ICD-10-CM | POA: Diagnosis not present

## 2022-04-27 DIAGNOSIS — Z7982 Long term (current) use of aspirin: Secondary | ICD-10-CM | POA: Diagnosis not present

## 2022-04-27 DIAGNOSIS — Z87891 Personal history of nicotine dependence: Secondary | ICD-10-CM | POA: Diagnosis not present

## 2022-04-27 DIAGNOSIS — E785 Hyperlipidemia, unspecified: Secondary | ICD-10-CM | POA: Diagnosis not present

## 2022-04-27 DIAGNOSIS — E1122 Type 2 diabetes mellitus with diabetic chronic kidney disease: Secondary | ICD-10-CM | POA: Diagnosis not present

## 2022-04-27 DIAGNOSIS — E872 Acidosis, unspecified: Secondary | ICD-10-CM | POA: Diagnosis not present

## 2022-04-27 DIAGNOSIS — N1330 Unspecified hydronephrosis: Secondary | ICD-10-CM | POA: Diagnosis not present

## 2022-04-28 DIAGNOSIS — N185 Chronic kidney disease, stage 5: Secondary | ICD-10-CM | POA: Diagnosis not present

## 2022-04-28 DIAGNOSIS — E1122 Type 2 diabetes mellitus with diabetic chronic kidney disease: Secondary | ICD-10-CM | POA: Diagnosis not present

## 2022-04-28 DIAGNOSIS — D631 Anemia in chronic kidney disease: Secondary | ICD-10-CM | POA: Diagnosis not present

## 2022-04-28 DIAGNOSIS — E785 Hyperlipidemia, unspecified: Secondary | ICD-10-CM | POA: Diagnosis not present

## 2022-04-28 DIAGNOSIS — Z8701 Personal history of pneumonia (recurrent): Secondary | ICD-10-CM | POA: Diagnosis not present

## 2022-04-28 DIAGNOSIS — G4700 Insomnia, unspecified: Secondary | ICD-10-CM | POA: Diagnosis not present

## 2022-04-28 DIAGNOSIS — I120 Hypertensive chronic kidney disease with stage 5 chronic kidney disease or end stage renal disease: Secondary | ICD-10-CM | POA: Diagnosis not present

## 2022-04-28 DIAGNOSIS — E872 Acidosis, unspecified: Secondary | ICD-10-CM | POA: Diagnosis not present

## 2022-04-28 DIAGNOSIS — Z7982 Long term (current) use of aspirin: Secondary | ICD-10-CM | POA: Diagnosis not present

## 2022-04-28 DIAGNOSIS — Z87891 Personal history of nicotine dependence: Secondary | ICD-10-CM | POA: Diagnosis not present

## 2022-04-28 DIAGNOSIS — I951 Orthostatic hypotension: Secondary | ICD-10-CM | POA: Diagnosis not present

## 2022-04-28 DIAGNOSIS — D510 Vitamin B12 deficiency anemia due to intrinsic factor deficiency: Secondary | ICD-10-CM | POA: Diagnosis not present

## 2022-04-28 DIAGNOSIS — N1330 Unspecified hydronephrosis: Secondary | ICD-10-CM | POA: Diagnosis not present

## 2022-04-28 DIAGNOSIS — H9190 Unspecified hearing loss, unspecified ear: Secondary | ICD-10-CM | POA: Diagnosis not present

## 2022-04-28 DIAGNOSIS — I2510 Atherosclerotic heart disease of native coronary artery without angina pectoris: Secondary | ICD-10-CM | POA: Diagnosis not present

## 2022-04-28 DIAGNOSIS — M47812 Spondylosis without myelopathy or radiculopathy, cervical region: Secondary | ICD-10-CM | POA: Diagnosis not present

## 2022-04-29 DIAGNOSIS — Z7982 Long term (current) use of aspirin: Secondary | ICD-10-CM | POA: Diagnosis not present

## 2022-04-29 DIAGNOSIS — I120 Hypertensive chronic kidney disease with stage 5 chronic kidney disease or end stage renal disease: Secondary | ICD-10-CM | POA: Diagnosis not present

## 2022-04-29 DIAGNOSIS — N185 Chronic kidney disease, stage 5: Secondary | ICD-10-CM | POA: Diagnosis not present

## 2022-04-29 DIAGNOSIS — Z8701 Personal history of pneumonia (recurrent): Secondary | ICD-10-CM | POA: Diagnosis not present

## 2022-04-29 DIAGNOSIS — I2510 Atherosclerotic heart disease of native coronary artery without angina pectoris: Secondary | ICD-10-CM | POA: Diagnosis not present

## 2022-04-29 DIAGNOSIS — G4700 Insomnia, unspecified: Secondary | ICD-10-CM | POA: Diagnosis not present

## 2022-04-29 DIAGNOSIS — Z87891 Personal history of nicotine dependence: Secondary | ICD-10-CM | POA: Diagnosis not present

## 2022-04-29 DIAGNOSIS — E785 Hyperlipidemia, unspecified: Secondary | ICD-10-CM | POA: Diagnosis not present

## 2022-04-29 DIAGNOSIS — E872 Acidosis, unspecified: Secondary | ICD-10-CM | POA: Diagnosis not present

## 2022-04-29 DIAGNOSIS — E1122 Type 2 diabetes mellitus with diabetic chronic kidney disease: Secondary | ICD-10-CM | POA: Diagnosis not present

## 2022-04-29 DIAGNOSIS — N1330 Unspecified hydronephrosis: Secondary | ICD-10-CM | POA: Diagnosis not present

## 2022-04-29 DIAGNOSIS — I951 Orthostatic hypotension: Secondary | ICD-10-CM | POA: Diagnosis not present

## 2022-04-29 DIAGNOSIS — H9190 Unspecified hearing loss, unspecified ear: Secondary | ICD-10-CM | POA: Diagnosis not present

## 2022-04-29 DIAGNOSIS — M47812 Spondylosis without myelopathy or radiculopathy, cervical region: Secondary | ICD-10-CM | POA: Diagnosis not present

## 2022-04-29 DIAGNOSIS — D631 Anemia in chronic kidney disease: Secondary | ICD-10-CM | POA: Diagnosis not present

## 2022-04-29 DIAGNOSIS — D510 Vitamin B12 deficiency anemia due to intrinsic factor deficiency: Secondary | ICD-10-CM | POA: Diagnosis not present

## 2022-05-04 ENCOUNTER — Encounter (HOSPITAL_COMMUNITY): Payer: Medicare Other

## 2022-05-04 ENCOUNTER — Encounter (HOSPITAL_COMMUNITY)
Admission: RE | Admit: 2022-05-04 | Discharge: 2022-05-04 | Disposition: A | Discharge status: Home / Self Care | Payer: Medicare Other | Source: Ambulatory Visit | Attending: Nephrology | Admitting: Nephrology

## 2022-05-04 VITALS — BP 125/69 | HR 48 | Temp 97.6°F | Resp 20

## 2022-05-04 DIAGNOSIS — D631 Anemia in chronic kidney disease: Secondary | ICD-10-CM | POA: Diagnosis not present

## 2022-05-04 DIAGNOSIS — N184 Chronic kidney disease, stage 4 (severe): Secondary | ICD-10-CM | POA: Diagnosis not present

## 2022-05-04 LAB — POCT HEMOGLOBIN-HEMACUE: Hemoglobin: 11.5 g/dL — ABNORMAL LOW (ref 13.0–17.0)

## 2022-05-04 MED ORDER — SODIUM CHLORIDE 0.9 % IV SOLN
510.0000 mg | Freq: Once | INTRAVENOUS | Status: AC
  Administered 2022-05-04: 510 mg via INTRAVENOUS
  Filled 2022-05-04: qty 17

## 2022-05-04 MED ORDER — DIPHENHYDRAMINE HCL 25 MG PO CAPS: 25.0000 mg | ORAL_CAPSULE | Freq: Once | ORAL | Status: DC

## 2022-05-04 MED ORDER — CLONIDINE HCL 0.1 MG PO TABS: 0.1000 mg | ORAL_TABLET | ORAL | Status: DC | PRN

## 2022-05-04 MED ORDER — ACETAMINOPHEN 325 MG PO TABS: 650.0000 mg | ORAL_TABLET | Freq: Once | ORAL | Status: DC

## 2022-05-04 MED ORDER — EPOETIN ALFA-EPBX 10000 UNIT/ML IJ SOLN
10000.0000 [IU] | Freq: Once | INTRAMUSCULAR | Status: AC
  Administered 2022-05-04: 10000 [IU] via SUBCUTANEOUS

## 2022-05-04 NOTE — Progress Notes (Signed)
Diagnosis: Anemia in Chronic Kidney Disease  Provider:   Elmarie Shiley MD  Procedure: Infusion  IV Type: Peripheral, IV Location: L Forearm  Feraheme (Ferumoxytol), Dose: 510 mg  Infusion Start Time: 6384  Infusion Stop Time: 1200  Post Infusion IV Care: Patient declined observation and Peripheral IV Discontinued  Discharge: Condition: Stable, Destination: Home . AVS declined.  Performed by:  Binnie Kand, RN       Diagnosis: Anemia in Chronic Kidney Disease  Provider:   Elmarie Shiley MD  Procedure: Injection  Retacrit (epoetin alfa-epbx), Dose: 10000 Units, Site: subcutaneous, Number of injections: 1  Hgb 11.5  Post Care: Patient declined observation  Discharge: Condition: Stable, Destination: Home . AVS declined.  Performed by:  Binnie Kand, RN

## 2022-05-04 NOTE — Addendum Note (Signed)
Encounter addended by: Binnie Kand, RN on: 05/04/2022 12:50 PM  Actions taken: LDA properties accepted

## 2022-05-04 NOTE — Addendum Note (Signed)
Encounter addended by: Baxter Hire, RN on: 05/04/2022 1:20 PM  Actions taken: Therapy plan modified

## 2022-05-05 DIAGNOSIS — G4700 Insomnia, unspecified: Secondary | ICD-10-CM | POA: Diagnosis not present

## 2022-05-05 DIAGNOSIS — I2510 Atherosclerotic heart disease of native coronary artery without angina pectoris: Secondary | ICD-10-CM | POA: Diagnosis not present

## 2022-05-05 DIAGNOSIS — E785 Hyperlipidemia, unspecified: Secondary | ICD-10-CM | POA: Diagnosis not present

## 2022-05-05 DIAGNOSIS — N1330 Unspecified hydronephrosis: Secondary | ICD-10-CM | POA: Diagnosis not present

## 2022-05-05 DIAGNOSIS — D631 Anemia in chronic kidney disease: Secondary | ICD-10-CM | POA: Diagnosis not present

## 2022-05-05 DIAGNOSIS — I951 Orthostatic hypotension: Secondary | ICD-10-CM | POA: Diagnosis not present

## 2022-05-05 DIAGNOSIS — D510 Vitamin B12 deficiency anemia due to intrinsic factor deficiency: Secondary | ICD-10-CM | POA: Diagnosis not present

## 2022-05-05 DIAGNOSIS — E872 Acidosis, unspecified: Secondary | ICD-10-CM | POA: Diagnosis not present

## 2022-05-05 DIAGNOSIS — H9190 Unspecified hearing loss, unspecified ear: Secondary | ICD-10-CM | POA: Diagnosis not present

## 2022-05-05 DIAGNOSIS — N185 Chronic kidney disease, stage 5: Secondary | ICD-10-CM | POA: Diagnosis not present

## 2022-05-05 DIAGNOSIS — Z87891 Personal history of nicotine dependence: Secondary | ICD-10-CM | POA: Diagnosis not present

## 2022-05-05 DIAGNOSIS — Z7982 Long term (current) use of aspirin: Secondary | ICD-10-CM | POA: Diagnosis not present

## 2022-05-05 DIAGNOSIS — M47812 Spondylosis without myelopathy or radiculopathy, cervical region: Secondary | ICD-10-CM | POA: Diagnosis not present

## 2022-05-05 DIAGNOSIS — E1122 Type 2 diabetes mellitus with diabetic chronic kidney disease: Secondary | ICD-10-CM | POA: Diagnosis not present

## 2022-05-05 DIAGNOSIS — I120 Hypertensive chronic kidney disease with stage 5 chronic kidney disease or end stage renal disease: Secondary | ICD-10-CM | POA: Diagnosis not present

## 2022-05-05 DIAGNOSIS — Z8701 Personal history of pneumonia (recurrent): Secondary | ICD-10-CM | POA: Diagnosis not present

## 2022-05-09 DIAGNOSIS — N185 Chronic kidney disease, stage 5: Secondary | ICD-10-CM | POA: Diagnosis not present

## 2022-05-09 DIAGNOSIS — Z8701 Personal history of pneumonia (recurrent): Secondary | ICD-10-CM | POA: Diagnosis not present

## 2022-05-09 DIAGNOSIS — E1122 Type 2 diabetes mellitus with diabetic chronic kidney disease: Secondary | ICD-10-CM | POA: Diagnosis not present

## 2022-05-09 DIAGNOSIS — Z87891 Personal history of nicotine dependence: Secondary | ICD-10-CM | POA: Diagnosis not present

## 2022-05-09 DIAGNOSIS — I951 Orthostatic hypotension: Secondary | ICD-10-CM | POA: Diagnosis not present

## 2022-05-09 DIAGNOSIS — H9190 Unspecified hearing loss, unspecified ear: Secondary | ICD-10-CM | POA: Diagnosis not present

## 2022-05-09 DIAGNOSIS — G4700 Insomnia, unspecified: Secondary | ICD-10-CM | POA: Diagnosis not present

## 2022-05-09 DIAGNOSIS — I120 Hypertensive chronic kidney disease with stage 5 chronic kidney disease or end stage renal disease: Secondary | ICD-10-CM | POA: Diagnosis not present

## 2022-05-09 DIAGNOSIS — M47812 Spondylosis without myelopathy or radiculopathy, cervical region: Secondary | ICD-10-CM | POA: Diagnosis not present

## 2022-05-09 DIAGNOSIS — E872 Acidosis, unspecified: Secondary | ICD-10-CM | POA: Diagnosis not present

## 2022-05-09 DIAGNOSIS — D510 Vitamin B12 deficiency anemia due to intrinsic factor deficiency: Secondary | ICD-10-CM | POA: Diagnosis not present

## 2022-05-09 DIAGNOSIS — I2510 Atherosclerotic heart disease of native coronary artery without angina pectoris: Secondary | ICD-10-CM | POA: Diagnosis not present

## 2022-05-09 DIAGNOSIS — Z7982 Long term (current) use of aspirin: Secondary | ICD-10-CM | POA: Diagnosis not present

## 2022-05-09 DIAGNOSIS — D631 Anemia in chronic kidney disease: Secondary | ICD-10-CM | POA: Diagnosis not present

## 2022-05-09 DIAGNOSIS — E785 Hyperlipidemia, unspecified: Secondary | ICD-10-CM | POA: Diagnosis not present

## 2022-05-09 DIAGNOSIS — N1330 Unspecified hydronephrosis: Secondary | ICD-10-CM | POA: Diagnosis not present

## 2022-05-11 ENCOUNTER — Ambulatory Visit: Payer: Medicare Other | Admitting: Dermatology

## 2022-05-12 DIAGNOSIS — N1330 Unspecified hydronephrosis: Secondary | ICD-10-CM | POA: Diagnosis not present

## 2022-05-12 DIAGNOSIS — I120 Hypertensive chronic kidney disease with stage 5 chronic kidney disease or end stage renal disease: Secondary | ICD-10-CM | POA: Diagnosis not present

## 2022-05-12 DIAGNOSIS — Z6826 Body mass index (BMI) 26.0-26.9, adult: Secondary | ICD-10-CM | POA: Diagnosis not present

## 2022-05-12 DIAGNOSIS — I951 Orthostatic hypotension: Secondary | ICD-10-CM | POA: Diagnosis not present

## 2022-05-12 DIAGNOSIS — Z8701 Personal history of pneumonia (recurrent): Secondary | ICD-10-CM | POA: Diagnosis not present

## 2022-05-12 DIAGNOSIS — D631 Anemia in chronic kidney disease: Secondary | ICD-10-CM | POA: Diagnosis not present

## 2022-05-12 DIAGNOSIS — G4700 Insomnia, unspecified: Secondary | ICD-10-CM | POA: Diagnosis not present

## 2022-05-12 DIAGNOSIS — E785 Hyperlipidemia, unspecified: Secondary | ICD-10-CM | POA: Diagnosis not present

## 2022-05-12 DIAGNOSIS — E872 Acidosis, unspecified: Secondary | ICD-10-CM | POA: Diagnosis not present

## 2022-05-12 DIAGNOSIS — M542 Cervicalgia: Secondary | ICD-10-CM | POA: Diagnosis not present

## 2022-05-12 DIAGNOSIS — Z7982 Long term (current) use of aspirin: Secondary | ICD-10-CM | POA: Diagnosis not present

## 2022-05-12 DIAGNOSIS — Z87891 Personal history of nicotine dependence: Secondary | ICD-10-CM | POA: Diagnosis not present

## 2022-05-12 DIAGNOSIS — M47812 Spondylosis without myelopathy or radiculopathy, cervical region: Secondary | ICD-10-CM | POA: Diagnosis not present

## 2022-05-12 DIAGNOSIS — I2510 Atherosclerotic heart disease of native coronary artery without angina pectoris: Secondary | ICD-10-CM | POA: Diagnosis not present

## 2022-05-12 DIAGNOSIS — D510 Vitamin B12 deficiency anemia due to intrinsic factor deficiency: Secondary | ICD-10-CM | POA: Diagnosis not present

## 2022-05-12 DIAGNOSIS — H9190 Unspecified hearing loss, unspecified ear: Secondary | ICD-10-CM | POA: Diagnosis not present

## 2022-05-12 DIAGNOSIS — N185 Chronic kidney disease, stage 5: Secondary | ICD-10-CM | POA: Diagnosis not present

## 2022-05-12 DIAGNOSIS — E1122 Type 2 diabetes mellitus with diabetic chronic kidney disease: Secondary | ICD-10-CM | POA: Diagnosis not present

## 2022-05-18 ENCOUNTER — Encounter (HOSPITAL_COMMUNITY): Admission: RE | Admit: 2022-05-18 | Payer: Medicare Other | Source: Ambulatory Visit

## 2022-05-19 ENCOUNTER — Encounter (HOSPITAL_COMMUNITY)
Admission: RE | Admit: 2022-05-19 | Discharge: 2022-05-19 | Disposition: A | Discharge status: Home / Self Care | Payer: Medicare Other | Source: Ambulatory Visit | Attending: Nephrology | Admitting: Nephrology

## 2022-05-19 VITALS — BP 111/71 | HR 62 | Temp 97.4°F | Resp 20

## 2022-05-19 DIAGNOSIS — D631 Anemia in chronic kidney disease: Secondary | ICD-10-CM | POA: Insufficient documentation

## 2022-05-19 DIAGNOSIS — N184 Chronic kidney disease, stage 4 (severe): Secondary | ICD-10-CM | POA: Insufficient documentation

## 2022-05-19 LAB — CBC WITH DIFFERENTIAL/PLATELET
Abs Immature Granulocytes: 0.01 10*3/uL (ref 0.00–0.07)
Basophils Absolute: 0 10*3/uL (ref 0.0–0.1)
Basophils Relative: 1 %
Eosinophils Absolute: 0.1 10*3/uL (ref 0.0–0.5)
Eosinophils Relative: 2 %
HCT: 42 % (ref 39.0–52.0)
Hemoglobin: 12.2 g/dL — ABNORMAL LOW (ref 13.0–17.0)
Immature Granulocytes: 0 %
Lymphocytes Relative: 33 %
Lymphs Abs: 2 10*3/uL (ref 0.7–4.0)
MCH: 28.6 pg (ref 26.0–34.0)
MCHC: 29 g/dL — ABNORMAL LOW (ref 30.0–36.0)
MCV: 98.6 fL (ref 80.0–100.0)
Monocytes Absolute: 0.5 10*3/uL (ref 0.1–1.0)
Monocytes Relative: 8 %
Neutro Abs: 3.4 10*3/uL (ref 1.7–7.7)
Neutrophils Relative %: 56 %
Platelets: 181 10*3/uL (ref 150–400)
RBC: 4.26 MIL/uL (ref 4.22–5.81)
RDW: 15.5 % (ref 11.5–15.5)
WBC: 6 10*3/uL (ref 4.0–10.5)
nRBC: 0 % (ref 0.0–0.2)

## 2022-05-19 LAB — IRON AND TIBC
Iron: 106 ug/dL (ref 45–182)
Saturation Ratios: 35 % (ref 17.9–39.5)
TIBC: 302 ug/dL (ref 250–450)
UIBC: 196 ug/dL

## 2022-05-19 LAB — POCT HEMOGLOBIN-HEMACUE: Hemoglobin: 12.2 g/dL — ABNORMAL LOW (ref 13.0–17.0)

## 2022-05-19 LAB — FERRITIN: Ferritin: 394 ng/mL — ABNORMAL HIGH (ref 24–336)

## 2022-05-19 MED ORDER — EPOETIN ALFA-EPBX 10000 UNIT/ML IJ SOLN: 10000.0000 [IU] | Freq: Once | INTRAMUSCULAR | Status: DC

## 2022-05-19 MED ORDER — CLONIDINE HCL 0.1 MG PO TABS: 0.1000 mg | ORAL_TABLET | ORAL | Status: DC | PRN

## 2022-05-19 NOTE — Progress Notes (Signed)
Diagnosis: Anemia in Chronic Kidney Disease  Provider:   Elmarie Shiley, MD  Procedure: Injection  Retacrit (epoetin alfa-epbx), Dose: 10000 Units, Site: subcutaneous, Number of injections: NOT GIVEN  Hgb 12.2  Post Care: Patient declined observation  Discharge: Condition: Good, Destination: Home . AVS Declined  Performed by:  Baxter Hire, RN

## 2022-05-20 DIAGNOSIS — M47812 Spondylosis without myelopathy or radiculopathy, cervical region: Secondary | ICD-10-CM | POA: Diagnosis not present

## 2022-05-20 DIAGNOSIS — E872 Acidosis, unspecified: Secondary | ICD-10-CM | POA: Diagnosis not present

## 2022-05-20 DIAGNOSIS — D510 Vitamin B12 deficiency anemia due to intrinsic factor deficiency: Secondary | ICD-10-CM | POA: Diagnosis not present

## 2022-05-20 DIAGNOSIS — I951 Orthostatic hypotension: Secondary | ICD-10-CM | POA: Diagnosis not present

## 2022-05-20 DIAGNOSIS — I120 Hypertensive chronic kidney disease with stage 5 chronic kidney disease or end stage renal disease: Secondary | ICD-10-CM | POA: Diagnosis not present

## 2022-05-20 DIAGNOSIS — Z8701 Personal history of pneumonia (recurrent): Secondary | ICD-10-CM | POA: Diagnosis not present

## 2022-05-20 DIAGNOSIS — D631 Anemia in chronic kidney disease: Secondary | ICD-10-CM | POA: Diagnosis not present

## 2022-05-20 DIAGNOSIS — E1122 Type 2 diabetes mellitus with diabetic chronic kidney disease: Secondary | ICD-10-CM | POA: Diagnosis not present

## 2022-05-20 DIAGNOSIS — H9190 Unspecified hearing loss, unspecified ear: Secondary | ICD-10-CM | POA: Diagnosis not present

## 2022-05-20 DIAGNOSIS — N185 Chronic kidney disease, stage 5: Secondary | ICD-10-CM | POA: Diagnosis not present

## 2022-05-20 DIAGNOSIS — Z87891 Personal history of nicotine dependence: Secondary | ICD-10-CM | POA: Diagnosis not present

## 2022-05-20 DIAGNOSIS — E785 Hyperlipidemia, unspecified: Secondary | ICD-10-CM | POA: Diagnosis not present

## 2022-05-20 DIAGNOSIS — Z7982 Long term (current) use of aspirin: Secondary | ICD-10-CM | POA: Diagnosis not present

## 2022-05-20 DIAGNOSIS — G4700 Insomnia, unspecified: Secondary | ICD-10-CM | POA: Diagnosis not present

## 2022-05-20 DIAGNOSIS — N1330 Unspecified hydronephrosis: Secondary | ICD-10-CM | POA: Diagnosis not present

## 2022-05-20 DIAGNOSIS — I2510 Atherosclerotic heart disease of native coronary artery without angina pectoris: Secondary | ICD-10-CM | POA: Diagnosis not present

## 2022-05-25 DIAGNOSIS — E1129 Type 2 diabetes mellitus with other diabetic kidney complication: Secondary | ICD-10-CM | POA: Diagnosis not present

## 2022-05-25 DIAGNOSIS — D631 Anemia in chronic kidney disease: Secondary | ICD-10-CM | POA: Diagnosis not present

## 2022-05-25 DIAGNOSIS — N184 Chronic kidney disease, stage 4 (severe): Secondary | ICD-10-CM | POA: Diagnosis not present

## 2022-05-25 DIAGNOSIS — Z79899 Other long term (current) drug therapy: Secondary | ICD-10-CM | POA: Diagnosis not present

## 2022-05-26 DIAGNOSIS — N185 Chronic kidney disease, stage 5: Secondary | ICD-10-CM | POA: Diagnosis not present

## 2022-05-26 DIAGNOSIS — G4700 Insomnia, unspecified: Secondary | ICD-10-CM | POA: Diagnosis not present

## 2022-05-26 DIAGNOSIS — I2510 Atherosclerotic heart disease of native coronary artery without angina pectoris: Secondary | ICD-10-CM | POA: Diagnosis not present

## 2022-05-26 DIAGNOSIS — I120 Hypertensive chronic kidney disease with stage 5 chronic kidney disease or end stage renal disease: Secondary | ICD-10-CM | POA: Diagnosis not present

## 2022-05-26 DIAGNOSIS — E785 Hyperlipidemia, unspecified: Secondary | ICD-10-CM | POA: Diagnosis not present

## 2022-05-26 DIAGNOSIS — Z8701 Personal history of pneumonia (recurrent): Secondary | ICD-10-CM | POA: Diagnosis not present

## 2022-05-26 DIAGNOSIS — M47812 Spondylosis without myelopathy or radiculopathy, cervical region: Secondary | ICD-10-CM | POA: Diagnosis not present

## 2022-05-26 DIAGNOSIS — E872 Acidosis, unspecified: Secondary | ICD-10-CM | POA: Diagnosis not present

## 2022-05-26 DIAGNOSIS — Z87891 Personal history of nicotine dependence: Secondary | ICD-10-CM | POA: Diagnosis not present

## 2022-05-26 DIAGNOSIS — Z7982 Long term (current) use of aspirin: Secondary | ICD-10-CM | POA: Diagnosis not present

## 2022-05-26 DIAGNOSIS — I951 Orthostatic hypotension: Secondary | ICD-10-CM | POA: Diagnosis not present

## 2022-05-26 DIAGNOSIS — D631 Anemia in chronic kidney disease: Secondary | ICD-10-CM | POA: Diagnosis not present

## 2022-05-26 DIAGNOSIS — D510 Vitamin B12 deficiency anemia due to intrinsic factor deficiency: Secondary | ICD-10-CM | POA: Diagnosis not present

## 2022-05-26 DIAGNOSIS — N1330 Unspecified hydronephrosis: Secondary | ICD-10-CM | POA: Diagnosis not present

## 2022-05-26 DIAGNOSIS — H9190 Unspecified hearing loss, unspecified ear: Secondary | ICD-10-CM | POA: Diagnosis not present

## 2022-05-26 DIAGNOSIS — E1122 Type 2 diabetes mellitus with diabetic chronic kidney disease: Secondary | ICD-10-CM | POA: Diagnosis not present

## 2022-06-01 ENCOUNTER — Encounter (HOSPITAL_COMMUNITY): Payer: Medicare Other

## 2022-06-02 ENCOUNTER — Encounter (HOSPITAL_COMMUNITY)
Admission: RE | Admit: 2022-06-02 | Payer: Medicare Other | Source: Ambulatory Visit | Attending: Nephrology | Admitting: Nephrology

## 2022-06-02 ENCOUNTER — Encounter: Payer: Self-pay | Admitting: Radiology

## 2022-06-03 DIAGNOSIS — N2581 Secondary hyperparathyroidism of renal origin: Secondary | ICD-10-CM | POA: Diagnosis not present

## 2022-06-03 DIAGNOSIS — I951 Orthostatic hypotension: Secondary | ICD-10-CM | POA: Diagnosis not present

## 2022-06-03 DIAGNOSIS — N184 Chronic kidney disease, stage 4 (severe): Secondary | ICD-10-CM | POA: Diagnosis not present

## 2022-06-03 DIAGNOSIS — D631 Anemia in chronic kidney disease: Secondary | ICD-10-CM | POA: Diagnosis not present

## 2022-06-08 ENCOUNTER — Encounter (HOSPITAL_COMMUNITY)
Admission: RE | Admit: 2022-06-08 | Discharge: 2022-06-08 | Disposition: A | Discharge status: Home / Self Care | Payer: Medicare Other | Source: Ambulatory Visit | Attending: Nephrology | Admitting: Nephrology

## 2022-06-08 VITALS — BP 120/63 | HR 58 | Temp 97.4°F | Resp 20

## 2022-06-08 DIAGNOSIS — D631 Anemia in chronic kidney disease: Secondary | ICD-10-CM | POA: Insufficient documentation

## 2022-06-08 DIAGNOSIS — N184 Chronic kidney disease, stage 4 (severe): Secondary | ICD-10-CM | POA: Diagnosis not present

## 2022-06-08 LAB — POCT HEMOGLOBIN-HEMACUE: Hemoglobin: 11.7 g/dL — ABNORMAL LOW (ref 13.0–17.0)

## 2022-06-08 MED ORDER — CLONIDINE HCL 0.1 MG PO TABS: 0.1000 mg | ORAL_TABLET | ORAL | Status: DC | PRN

## 2022-06-08 MED ORDER — EPOETIN ALFA-EPBX 10000 UNIT/ML IJ SOLN
10000.0000 [IU] | Freq: Once | INTRAMUSCULAR | Status: AC
  Administered 2022-06-08: 10000 [IU] via SUBCUTANEOUS
  Filled 2022-06-08: qty 1

## 2022-06-08 NOTE — Progress Notes (Signed)
Diagnosis: Anemia in Chronic Kidney Disease  Provider:   Elmarie Shiley, MD  Procedure: Injection  Retacrit (epoetin alfa-epbx), Dose: 10000 Units, Site: subcutaneous, Number of injections: 1  Post Care: Patient declined observation  Discharge: Condition: Good, Destination: Home . AVS Declined  Performed by:  Baxter Hire, RN

## 2022-06-08 NOTE — Addendum Note (Signed)
Encounter addended by: Baxter Hire, RN on: 06/08/2022 12:50 PM  Actions taken: Therapy plan modified

## 2022-06-15 ENCOUNTER — Encounter (HOSPITAL_COMMUNITY): Payer: Medicare Other

## 2022-06-16 ENCOUNTER — Other Ambulatory Visit: Payer: Self-pay

## 2022-06-16 ENCOUNTER — Encounter (HOSPITAL_COMMUNITY): Payer: Self-pay | Admitting: Physical Therapy

## 2022-06-16 ENCOUNTER — Ambulatory Visit (HOSPITAL_COMMUNITY): Payer: Medicare Other | Attending: Neurosurgery | Admitting: Physical Therapy

## 2022-06-16 ENCOUNTER — Encounter (HOSPITAL_COMMUNITY): Payer: Medicare Other

## 2022-06-16 DIAGNOSIS — M542 Cervicalgia: Secondary | ICD-10-CM | POA: Diagnosis not present

## 2022-06-16 DIAGNOSIS — R29898 Other symptoms and signs involving the musculoskeletal system: Secondary | ICD-10-CM | POA: Insufficient documentation

## 2022-06-16 NOTE — Therapy (Signed)
OUTPATIENT PHYSICAL THERAPY CERVICAL EVALUATION   Patient Name: Eric Brady MRN: [id number] DOB:1933-02-24, 89 y.o., male Today's Date: 06/16/2022  END OF SESSION:  PT End of Session - 06/16/22 1444     Visit Number 1    Number of Visits 12   1-2x/week for 6 weeks   Date for PT Re-Evaluation 07/28/22    Authorization Type BCBS Medicare    Progress Note Due on Visit 10    PT Start Time 1445    PT Stop Time 1520    PT Time Calculation (min) 35 min    Activity Tolerance Patient tolerated treatment well    Behavior During Therapy WFL for tasks assessed/performed             Past Medical History:  Diagnosis Date   Anemia    Asthma    only as a child   Cancer ([place])    Prostate   Diabetes mellitus without complication ([place])    History of radiation therapy    Hyperlipidemia    Squamous cell carcinoma of skin 08/12/2019   in situ on right ear rim(CX3&EXC)   Past Surgical History:  Procedure Laterality Date   COLONOSCOPY N/A 09/12/2012   Procedure: COLONOSCOPY;  Surgeon: Rogene Houston, MD;  [facility];  Service: Endoscopy;  Laterality: N/A;  915   EYE SURGERY     LUMBAR LAMINECTOMY/DECOMPRESSION MICRODISCECTOMY Right 10/26/2020   Procedure: Microdiscectomy - Lumbar Three-Four Right;  Surgeon: Kary Kos, MD;  Location: [facility];  Service: Neurosurgery;  Laterality: Right;  3C   ORIF ANKLE FRACTURE Right    YAG LASER APPLICATION Left [id number]   Procedure: YAG LASER APPLICATION;  Surgeon: Williams Che, MD;  Location: [facility];  Service: Ophthalmology;  Laterality: Left;   Patient Active Problem List   Diagnosis Date Noted   Anemia of chronic renal failure 02/01/2022   HNP (herniated nucleus pulposus), lumbar 10/26/2020   Dizziness 01/21/2020   Sensorineural hearing loss (SNHL) of both ears 01/21/2020   Anaphylactic shock due to adverse food reaction 12/21/2019   Prostate cancer ([place]) 12/21/2019   DM (diabetes mellitus) ([place]) 10/31/2018   HLD  (hyperlipidemia) 10/31/2018   Hypotension 10/31/2018   Mild aortic stenosis 10/31/2018   Family history of malignant neoplasm of prostate 04/15/2016    PCP: Asencion Noble MD  REFERRING PROVIDER: Kary Kos, MD  REFERRING DIAG: CERVICALGIA  THERAPY DIAG:  Cervicalgia  Other symptoms and signs involving the musculoskeletal system  Rationale for Evaluation and Treatment: Rehabilitation  ONSET DATE: chronic  SUBJECTIVE:  SUBJECTIVE STATEMENT: Patient states neck pain in base of neck. Did PT before last fall and it did not seem to help much. Symptoms worse with twisting head. Been watching more TV lately sitting in recliner. Spot just aches sometimes. Stamina is poor due to other health issues.   PERTINENT HISTORY:  DM, lumbar surgery, HLD, recent bout of PT for neck pain 8/23-9/23  PAIN:  Are you having pain? No  PRECAUTIONS: None  WEIGHT BEARING RESTRICTIONS: No  FALLS:  Has patient fallen in last 6 months? No  OCCUPATION: Retired  PLOF: Independent  PATIENT GOALS: Less pain and to be able to move his head better.     OBJECTIVE:   DIAGNOSTIC FINDINGS:  MRI 02/09/22 Discs: Degenerative disease with disc height loss at C2-3, C4-5, C5-6, C6-7, C7-T1, T1-2 and T2-3.   C2-3: Mild broad-based disc bulge. Moderate right and mild left facet arthropathy. Severe right foraminal stenosis. No left foraminal stenosis. No spinal stenosis.   C3-4: Mild broad-based disc bulge. Severe right facet arthropathy. Bilateral uncovertebral degenerative changes. Moderate left foraminal stenosis. Severe right foraminal stenosis. No spinal stenosis.   C4-5: Broad-based disc osteophyte complex. Bilateral uncovertebral degenerative changes. Mild bilateral facet arthropathy. Severe bilateral  foraminal stenosis. Moderate spinal stenosis.   C5-6: Mild broad-based disc bulge. Bilateral uncovertebral degenerative changes. Mild bilateral facet arthropathy. Severe bilateral foraminal stenosis. Mild spinal stenosis.   C6-7: Mild broad-based disc bulge. Bilateral uncovertebral degenerative changes. Severe bilateral foraminal stenosis. No spinal stenosis.   C7-T1: No significant disc bulge. No neural foraminal stenosis. No central canal stenosis.   T1-2 and T2-3: Mild broad-based disc bulges and mild bilateral foraminal narrowing.   IMPRESSION: 1. Diffuse cervical spine spondylosis as described above. 2. No acute osseous injury of the cervical spine.  PATIENT SURVEYS:  FOTO complete next session - time limited  COGNITION: Overall cognitive status: Within functional limits for tasks assessed  SENSATION: WFL  POSTURE: rounded shoulders and forward head  PALPATION: TTP slightly cervical paraspinals, minimally bilateral UT; hypomobile throughout cervical spine   CERVICAL ROM:   Active ROM A/PROM  eval  Flexion 0% limited  Extension 25% limited  Right lateral flexion 75% limited  Left lateral flexion 75% limited  Right rotation 25% limited  Left rotation 25% limited   (Blank rows = not tested)  UPPER EXTREMITY ROM: WFL for tasks assessed    UPPER EXTREMITY MMT:  MMT Right eval Left eval  Shoulder flexion 5 5  Shoulder extension    Shoulder abduction 5 5  Shoulder adduction    Shoulder extension    Shoulder internal rotation 5 5  Shoulder external rotation 5 4+  Middle trapezius    Lower trapezius    Elbow flexion 5 5  Elbow extension 5 5  Wrist flexion    Wrist extension    Wrist ulnar deviation    Wrist radial deviation    Wrist pronation    Wrist supination    Grip strength     (Blank rows = not tested)     TODAY'S TREATMENT:  DATE:  06/16/22 Eval and education   PATIENT EDUCATION:  Education details: Patient educated on exam findings, POC, scope of PT, HEP, dry needling, and posture. Person educated: Patient and son Education method: Explanation, Demonstration, and Handouts Education comprehension: verbalized understanding, returned demonstration, verbal cues required, and tactile cues required  HOME EXERCISE PROGRAM: Posture  ASSESSMENT:  CLINICAL IMPRESSION: Patient a 89 y.o. y.o. male who was seen today for physical therapy evaluation and treatment for neck pain. Patient presents with pain limited deficits in cervical spine posture, strength, ROM, endurance, activity tolerance, and functional mobility with ADL. Patient is having to modify and restrict ADL as indicated by outcome measure score as well as subjective information and objective measures which is affecting overall participation. Patient will benefit from skilled physical therapy in order to improve function and reduce impairment.  OBJECTIVE IMPAIRMENTS: decreased activity tolerance, decreased endurance, decreased mobility, decreased ROM, hypomobility, impaired flexibility, impaired sensation, improper body mechanics, postural dysfunction, and pain.   ACTIVITY LIMITATIONS: carrying, lifting, bending, reach over head, and caring for others  PARTICIPATION LIMITATIONS: meal prep, cleaning, laundry, community activity, and yard work  PERSONAL FACTORS: Age, Fitness, Time since onset of injury/illness/exacerbation, and 3+ comorbidities: DM, hx lumbar surgery, HLD  are also affecting patient's functional outcome.   REHAB POTENTIAL: Fair    CLINICAL DECISION MAKING: Stable/uncomplicated  EVALUATION COMPLEXITY: Low   GOALS: Goals reviewed with patient? Yes  SHORT TERM GOALS: Target date: 07/07/2022    Patient will be independent with HEP in order to improve functional outcomes. Baseline: Goal status: INITIAL  2.  Patient will report  at least 25% improvement in symptoms for improved quality of life. Baseline:  Goal status: INITIAL    LONG TERM GOALS: Target date: 07/28/2022    Patient will report at least 75% improvement in symptoms for improved quality of life. Baseline:  Goal status: INITIAL  2.  Patient will improve FOTO score to predicted outcomes in order to indicate improved tolerance to activity. Baseline:  Goal status: INITIAL  3.  Patient will demonstrate at least 25% improvement in cervical ROM in all restricted planes for improved ability to move head while performing chores. Baseline: see above Goal status: INITIAL  4.  Patient will be able to return to all activities unrestricted for improved ability to perform work functions and participate with family.  Baseline:  Goal status: INITIAL  5.  Patient will demonstrate grade of 5/5 MMT grade in all tested musculature as evidence of improved strength to assist with lifting. Baseline: see above Goal status: INITIAL  6.  Patient will sit for at least 10 minutes with improved posture to indicate improved postural strength. Baseline:  Goal status: INITIAL     PLAN:  PT FREQUENCY: 1-2x/week  PT DURATION: 6 weeks  PLANNED INTERVENTIONS: Therapeutic exercises, Therapeutic activity, Neuromuscular re-education, Balance training, Gait training, Patient/Family education, Joint manipulation, Joint mobilization, Stair training, Orthotic/Fit training, DME instructions, Aquatic Therapy, Dry Needling, Electrical stimulation, Spinal manipulation, Spinal mobilization, Cryotherapy, Moist heat, Compression bandaging, scar mobilization, Splintting, Taping, Traction, Ultrasound, Ionotophoresis '4mg'$ /ml Dexamethasone, and Manual therapy  PLAN FOR NEXT SESSION: posture, possibly manual/DN, postural strength   Vianne Bulls Rochell Mabie, PT 06/16/2022, 3:28 PM

## 2022-06-23 ENCOUNTER — Encounter (HOSPITAL_COMMUNITY)
Admission: RE | Admit: 2022-06-23 | Discharge: 2022-06-23 | Disposition: A | Discharge status: Home / Self Care | Payer: Medicare Other | Source: Ambulatory Visit | Attending: Nephrology | Admitting: Nephrology

## 2022-06-23 ENCOUNTER — Encounter (HOSPITAL_COMMUNITY): Payer: Medicare Other

## 2022-06-23 VITALS — BP 147/74 | HR 56 | Temp 97.4°F | Resp 19

## 2022-06-23 DIAGNOSIS — N184 Chronic kidney disease, stage 4 (severe): Secondary | ICD-10-CM | POA: Diagnosis not present

## 2022-06-23 DIAGNOSIS — D631 Anemia in chronic kidney disease: Secondary | ICD-10-CM

## 2022-06-23 LAB — CBC WITH DIFFERENTIAL/PLATELET
Abs Immature Granulocytes: 0.01 10*3/uL (ref 0.00–0.07)
Basophils Absolute: 0 10*3/uL (ref 0.0–0.1)
Basophils Relative: 1 %
Eosinophils Absolute: 0.2 10*3/uL (ref 0.0–0.5)
Eosinophils Relative: 3 %
HCT: 41.4 % (ref 39.0–52.0)
Hemoglobin: 12.3 g/dL — ABNORMAL LOW (ref 13.0–17.0)
Immature Granulocytes: 0 %
Lymphocytes Relative: 40 %
Lymphs Abs: 2.6 10*3/uL (ref 0.7–4.0)
MCH: 28.8 pg (ref 26.0–34.0)
MCHC: 29.7 g/dL — ABNORMAL LOW (ref 30.0–36.0)
MCV: 97 fL (ref 80.0–100.0)
Monocytes Absolute: 0.5 10*3/uL (ref 0.1–1.0)
Monocytes Relative: 8 %
Neutro Abs: 3.2 10*3/uL (ref 1.7–7.7)
Neutrophils Relative %: 48 %
Platelets: 160 10*3/uL (ref 150–400)
RBC: 4.27 MIL/uL (ref 4.22–5.81)
RDW: 15.5 % (ref 11.5–15.5)
WBC: 6.6 10*3/uL (ref 4.0–10.5)
nRBC: 0 % (ref 0.0–0.2)

## 2022-06-23 LAB — IRON AND TIBC
Iron: 129 ug/dL (ref 45–182)
Saturation Ratios: 43 % — ABNORMAL HIGH (ref 17.9–39.5)
TIBC: 298 ug/dL (ref 250–450)
UIBC: 169 ug/dL

## 2022-06-23 LAB — FERRITIN: Ferritin: 274 ng/mL (ref 24–336)

## 2022-06-23 LAB — POCT HEMOGLOBIN-HEMACUE: Hemoglobin: 12.5 g/dL — ABNORMAL LOW (ref 13.0–17.0)

## 2022-06-23 MED ORDER — CLONIDINE HCL 0.1 MG PO TABS: 0.1000 mg | ORAL_TABLET | ORAL | Status: DC | PRN

## 2022-06-23 MED ORDER — EPOETIN ALFA-EPBX 10000 UNIT/ML IJ SOLN: 10000.0000 [IU] | Freq: Once | INTRAMUSCULAR | Status: DC

## 2022-06-23 NOTE — Progress Notes (Signed)
Diagnosis: Anemia in Chronic Kidney Disease  Provider:   Elmarie Shiley   Retacrit (epoetin alfa-epbx), Number of injections: 0  Not given HGB 12.5   Discharge: Condition: Good, Destination: Home . AVS Provided and AVS Declined  Performed by:  Grayland Ormond, RN

## 2022-06-30 ENCOUNTER — Encounter (HOSPITAL_COMMUNITY): Payer: Medicare Other

## 2022-06-30 DIAGNOSIS — D519 Vitamin B12 deficiency anemia, unspecified: Secondary | ICD-10-CM | POA: Diagnosis not present

## 2022-07-07 ENCOUNTER — Encounter (HOSPITAL_COMMUNITY)
Admission: RE | Admit: 2022-07-07 | Discharge: 2022-07-07 | Disposition: A | Discharge status: Home / Self Care | Payer: Medicare Other | Source: Ambulatory Visit | Attending: Nephrology | Admitting: Nephrology

## 2022-07-07 VITALS — BP 91/56 | HR 60 | Temp 97.9°F | Resp 19

## 2022-07-07 DIAGNOSIS — N184 Chronic kidney disease, stage 4 (severe): Secondary | ICD-10-CM | POA: Insufficient documentation

## 2022-07-07 DIAGNOSIS — D631 Anemia in chronic kidney disease: Secondary | ICD-10-CM | POA: Insufficient documentation

## 2022-07-07 LAB — POCT HEMOGLOBIN-HEMACUE: Hemoglobin: 12.9 g/dL — ABNORMAL LOW (ref 13.0–17.0)

## 2022-07-07 MED ORDER — EPOETIN ALFA-EPBX 10000 UNIT/ML IJ SOLN: 10000.0000 [IU] | Freq: Once | INTRAMUSCULAR | Status: DC

## 2022-07-07 MED ORDER — CLONIDINE HCL 0.1 MG PO TABS: 0.1000 mg | ORAL_TABLET | ORAL | Status: DC | PRN

## 2022-07-07 NOTE — Addendum Note (Signed)
Encounter addended by: Binnie Kand, RN on: 07/07/2022 11:29 AM  Actions taken: Flowsheet accepted

## 2022-07-07 NOTE — Progress Notes (Signed)
  Diagnosis: Anemia in Chronic Kidney Disease   Provider:   Elmarie Shiley    Retacrit (epoetin alfa-epbx), Number of injections: 0    Not given HGB 12.9     Discharge: Condition: Good,   Destination: Home . AVS Provided   AVS Provided   Performed by:  Grayland Ormond, RN

## 2022-07-07 NOTE — Addendum Note (Signed)
Encounter addended by: Binnie Kand, RN on: 07/07/2022 11:27 AM  Actions taken: Therapy plan modified

## 2022-07-21 ENCOUNTER — Encounter (HOSPITAL_COMMUNITY): Payer: Medicare Other

## 2022-07-21 ENCOUNTER — Encounter (HOSPITAL_COMMUNITY)
Admission: RE | Admit: 2022-07-21 | Discharge: 2022-07-21 | Disposition: A | Discharge status: Home / Self Care | Payer: Medicare Other | Source: Ambulatory Visit | Attending: Nephrology | Admitting: Nephrology

## 2022-07-21 VITALS — BP 115/79 | HR 60 | Temp 98.0°F | Resp 16

## 2022-07-21 DIAGNOSIS — D631 Anemia in chronic kidney disease: Secondary | ICD-10-CM | POA: Diagnosis not present

## 2022-07-21 DIAGNOSIS — N184 Chronic kidney disease, stage 4 (severe): Secondary | ICD-10-CM | POA: Diagnosis not present

## 2022-07-21 LAB — CBC WITH DIFFERENTIAL/PLATELET
Abs Immature Granulocytes: 0.01 10*3/uL (ref 0.00–0.07)
Basophils Absolute: 0.1 10*3/uL (ref 0.0–0.1)
Basophils Relative: 1 %
Eosinophils Absolute: 0.1 10*3/uL (ref 0.0–0.5)
Eosinophils Relative: 2 %
HCT: 36.6 % — ABNORMAL LOW (ref 39.0–52.0)
Hemoglobin: 10.9 g/dL — ABNORMAL LOW (ref 13.0–17.0)
Immature Granulocytes: 0 %
Lymphocytes Relative: 41 %
Lymphs Abs: 3.1 10*3/uL (ref 0.7–4.0)
MCH: 28.9 pg (ref 26.0–34.0)
MCHC: 29.8 g/dL — ABNORMAL LOW (ref 30.0–36.0)
MCV: 97.1 fL (ref 80.0–100.0)
Monocytes Absolute: 0.6 10*3/uL (ref 0.1–1.0)
Monocytes Relative: 7 %
Neutro Abs: 3.8 10*3/uL (ref 1.7–7.7)
Neutrophils Relative %: 49 %
Platelets: 157 10*3/uL (ref 150–400)
RBC: 3.77 MIL/uL — ABNORMAL LOW (ref 4.22–5.81)
RDW: 14.6 % (ref 11.5–15.5)
WBC: 7.6 10*3/uL (ref 4.0–10.5)
nRBC: 0 % (ref 0.0–0.2)

## 2022-07-21 LAB — IRON AND TIBC
Iron: 143 ug/dL (ref 45–182)
Saturation Ratios: 52 % — ABNORMAL HIGH (ref 17.9–39.5)
TIBC: 276 ug/dL (ref 250–450)
UIBC: 133 ug/dL

## 2022-07-21 LAB — POCT HEMOGLOBIN-HEMACUE: Hemoglobin: 11.3 g/dL — ABNORMAL LOW (ref 13.0–17.0)

## 2022-07-21 LAB — FERRITIN: Ferritin: 306 ng/mL (ref 24–336)

## 2022-07-21 MED ORDER — EPOETIN ALFA-EPBX 10000 UNIT/ML IJ SOLN
10000.0000 [IU] | Freq: Once | INTRAMUSCULAR | Status: AC
  Administered 2022-07-21: 10000 [IU] via SUBCUTANEOUS

## 2022-07-21 MED ORDER — CLONIDINE HCL 0.1 MG PO TABS: 0.1000 mg | ORAL_TABLET | ORAL | Status: DC | PRN

## 2022-07-21 NOTE — Progress Notes (Signed)
Diagnosis: Anemia in Chronic Kidney Disease  Provider:   Zetta Bills MD  Procedure: Injection  Retacrit (epoetin alfa-epbx), Dose: 10000 Units, Site: subcutaneous, Number of injections: 1  Hgb 11.3  Post Care: Patient declined observation  Discharge: Condition: Good, Destination: Home . AVS Provided  Performed by:  Evelena Peat, RN

## 2022-07-21 NOTE — Addendum Note (Signed)
Encounter addended by: Kandra Nicolas, RN on: 07/21/2022 3:31 PM  Actions taken: Order list changed, Therapy plan modified

## 2022-08-04 ENCOUNTER — Encounter (HOSPITAL_COMMUNITY)
Admission: RE | Admit: 2022-08-04 | Discharge: 2022-08-04 | Disposition: A | Discharge status: Home / Self Care | Payer: Medicare Other | Source: Ambulatory Visit | Attending: Nephrology | Admitting: Nephrology

## 2022-08-04 ENCOUNTER — Encounter (HOSPITAL_COMMUNITY): Payer: Medicare Other

## 2022-08-04 VITALS — BP 115/59 | HR 57 | Resp 20

## 2022-08-04 DIAGNOSIS — N184 Chronic kidney disease, stage 4 (severe): Secondary | ICD-10-CM | POA: Diagnosis not present

## 2022-08-04 DIAGNOSIS — D631 Anemia in chronic kidney disease: Secondary | ICD-10-CM | POA: Insufficient documentation

## 2022-08-04 LAB — POCT HEMOGLOBIN-HEMACUE: Hemoglobin: 12.1 g/dL — ABNORMAL LOW (ref 13.0–17.0)

## 2022-08-04 MED ORDER — CLONIDINE HCL 0.1 MG PO TABS: 0.1000 mg | ORAL_TABLET | ORAL | Status: DC | PRN

## 2022-08-04 MED ORDER — EPOETIN ALFA-EPBX 10000 UNIT/ML IJ SOLN: 10000.0000 [IU] | Freq: Once | INTRAMUSCULAR | Status: DC

## 2022-08-04 NOTE — Addendum Note (Signed)
Encounter addended by: Tera Mater, [facility] on: 08/04/2022 3:04 PM  Actions taken: Order list changed

## 2022-08-04 NOTE — Addendum Note (Signed)
Encounter addended by: Wyvonne Lenz, RN on: 08/04/2022 3:36 PM  Actions taken: Therapy plan modified

## 2022-08-04 NOTE — Progress Notes (Signed)
Hgb: 12.1, Retacrit injection not needed.

## 2022-08-05 DIAGNOSIS — D519 Vitamin B12 deficiency anemia, unspecified: Secondary | ICD-10-CM | POA: Diagnosis not present

## 2022-08-12 ENCOUNTER — Encounter: Payer: Self-pay | Admitting: Cardiology

## 2022-08-12 ENCOUNTER — Ambulatory Visit: Payer: Medicare Other | Attending: Cardiology | Admitting: Cardiology

## 2022-08-12 VITALS — BP 112/60 | HR 60 | Ht 70.0 in | Wt 177.4 lb

## 2022-08-12 DIAGNOSIS — I951 Orthostatic hypotension: Secondary | ICD-10-CM

## 2022-08-12 DIAGNOSIS — R001 Bradycardia, unspecified: Secondary | ICD-10-CM

## 2022-08-12 MED ORDER — MIDODRINE HCL 5 MG PO TABS: ORAL_TABLET | ORAL | 3 refills | Status: DC

## 2022-08-12 NOTE — Progress Notes (Signed)
Clinical Summary Mr. Eric Brady is a 89 y.o.male seen today for follow up of the following medical problems      1. Dizzy spells/Orthostatic hypotension - previously found to be severely orthostatic in clinic, SBP dropped 43 points with standing.  - has done well on midodrine and florinef   - compliant with florinef, midodrine - working to stay hydrated.  - mild infrequent dizziness   - since our last visit appears off florinef, unclear history - midodrine was lowered to 5mg  tid during 03/2022 admission with pneumonia  - some dizziness first thing in the morning. Afternoon to evening does well.  -working to stay well hydrated   2. Prostate cancer - complete radiation, completed hormone therapy.      3. Bradycardia Mild sinus brady, chronic for several years.  - 03/2019 monitor AV HR 52, reported symptoms correlated with SR and mild sinus brady       4. CKD - followed by nephrology       Past Medical History:  Diagnosis Date   Anemia    Asthma    only as a child   Cancer ([facility])    Prostate   Diabetes mellitus without complication ([facility])    History of radiation therapy    Hyperlipidemia    Squamous cell carcinoma of skin 08/12/2019   in situ on right ear rim(CX3&EXC)     Not on File   Current Outpatient Medications  Medication Sig Dispense Refill   acetaminophen (TYLENOL) 650 MG CR tablet Take 650 mg by mouth in the morning and at bedtime. Takes two tablets     aspirin EC 81 MG tablet Take 81 mg by mouth daily. Swallow whole.     atorvastatin (LIPITOR) 10 MG tablet Take 10 mg by mouth every evening.     calcium-vitamin D (OSCAL WITH D) 500-200 MG-UNIT tablet Take 2 tablets by mouth daily.      cholecalciferol (VITAMIN D3) 25 MCG (1000 UNIT) tablet Take 1,000 Units by mouth daily.     clobetasol (OLUX) 0.05 % topical foam Apply topically 2 (two) times daily. APPLY TO THE SCALP TWICE DAILY FOR 1 MONTH 50 g 2   cyanocobalamin (,VITAMIN B-12,) 1000 MCG/ML  injection Inject 1,000 mcg into the muscle every 30 (thirty) days.     cyclobenzaprine (FLEXERIL) 10 MG tablet Take 1 tablet (10 mg total) by mouth 3 (three) times daily as needed for muscle spasms. 30 tablet 0   EPINEPHrine (EPIPEN 2-PAK) 0.3 mg/0.3 mL IJ SOAJ injection Inject 0.3 mg into the muscle once as needed for anaphylaxis. 2 each 1   epoetin alfa-epbx (RETACRIT) 3000 UNIT/ML injection      ferrous sulfate 325 (65 FE) MG tablet      fludrocortisone (FLORINEF) 0.1 MG tablet Take 2 tablets (0.2 mg total) by mouth daily. 180 tablet 3   HYDROcodone-acetaminophen (VICODIN) 5-325 mg TABS tablet Take one tablet by mouth every six hours as needed for pain 6 tablet 0   metFORMIN (GLUCOPHAGE-XR) 500 MG 24 hr tablet Take 1,000 mg by mouth every evening.  11   midodrine (PROAMATINE) 10 MG tablet Take 10 mg by mouth 3 (three) times daily.     Multiple Vitamin (MULTIVITAMIN) capsule Take 1 capsule by mouth daily.     ONETOUCH VERIO test strip 1 each daily.     oxyCODONE 10 MG TABS Take 1 tablet (10 mg total) by mouth every 6 (six) hours as needed for severe pain ((score 7 to  10)). 30 tablet 0   pioglitazone (ACTOS) 15 MG tablet Take 15 mg by mouth every evening.     sodium bicarbonate 650 MG tablet Take 650 mg by mouth 3 (three) times daily.     traMADol (ULTRAM) 50 MG tablet Take 1 tablet (50 mg total) by mouth every 6 (six) hours as needed. 12 tablet 0   VELTASSA 8.4 g packet 8.4 g.     No current facility-administered medications for this visit.     Past Surgical History:  Procedure Laterality Date   COLONOSCOPY N/A 09/12/2012   Procedure: COLONOSCOPY;  Surgeon: Malissa Hippo, MD;  [facility];  Service: Endoscopy;  Laterality: N/A;  915   EYE SURGERY     LUMBAR LAMINECTOMY/DECOMPRESSION MICRODISCECTOMY Right 10/26/2020   Procedure: Microdiscectomy - Lumbar Three-Four Right;  Surgeon: Donalee Citrin, MD;  Location: [facility];  Service: Neurosurgery;  Laterality: Right;  3C   ORIF ANKLE  FRACTURE Right    YAG LASER APPLICATION Left 10/19/2015   Procedure: YAG LASER APPLICATION;  Surgeon: Susa Simmonds, MD;  Location: [facility];  Service: Ophthalmology;  Laterality: Left;     Not on File    Family History  Problem Relation Age of Onset   Cancer Mother    Prostate cancer Brother      Social History Mr. Allred reports that he quit smoking about 31 years ago. His smoking use included cigarettes. He has never used smokeless tobacco. Mr. Gills reports no history of alcohol use.   Review of Systems CONSTITUTIONAL: No weight loss, fever, chills, weakness or fatigue.  HEENT: Eyes: No visual loss, blurred vision, double vision or yellow sclerae.No hearing loss, sneezing, congestion, runny nose or sore throat.  SKIN: No rash or itching.  CARDIOVASCULAR: per hpi RESPIRATORY: No shortness of breath, cough or sputum.  GASTROINTESTINAL: No anorexia, nausea, vomiting or diarrhea. No abdominal pain or blood.  GENITOURINARY: No burning on urination, no polyuria NEUROLOGICAL: No headache, dizziness, syncope, paralysis, ataxia, numbness or tingling in the extremities. No change in bowel or bladder control.  MUSCULOSKELETAL: No muscle, back pain, joint pain or stiffness.  LYMPHATICS: No enlarged nodes. No history of splenectomy.  PSYCHIATRIC: No history of depression or anxiety.  ENDOCRINOLOGIC: No reports of sweating, cold or heat intolerance. No polyuria or polydipsia.  Marland Kitchen   Physical Examination Today's Vitals   08/12/22 1532  BP: 112/60  Pulse: 60  SpO2: 96%  Weight: 177 lb 6.4 oz (80.5 kg)  Height: 5\' 10"  (1.778 m)   Body mass index is 25.45 kg/m.  Gen: resting comfortably, no acute distress HEENT: no scleral icterus, pupils equal round and reactive, no palptable cervical adenopathy,  CV: RRR, no m/rg, no jvd Resp: Clear to auscultation bilaterally GI: abdomen is soft, non-tender, non-distended, normal bowel sounds, no hepatosplenomegaly MSK: extremities are  warm, no edema.  Skin: warm, no rash Neuro:  no focal deficits Psych: appropriate affect   Diagnostic Studies 02/2018 echo Study Conclusions   - Left ventricle: The cavity size was normal. Systolic function was   normal. The estimated ejection fraction was in the range of 60%   to 65%. Wall motion was normal; there were no regional wall   motion abnormalities. Left ventricular diastolic function   parameters were normal. - Aortic valve: There was very mild stenosis. Valve area (Vmax):   2.29 cm^2. - Mitral valve: Moderately calcified annulus. Moderately thickened   leaflets . - Left atrium: The atrium was mildly dilated. - Atrial septum:  No defect or patent foramen ovale was identified.    Assessment and Plan   1. Dizziness/Orhotstatic hypotension - some dizziness first thing in the morning - will try increasing his evening midodrine to 10mg , continue 5mg  in monrning and afternoon.   2. Bradycardia - chronic and asymptoamtic - no significant symptoms, continue to monitor   Antoine Poche, M.D.

## 2022-08-12 NOTE — Patient Instructions (Signed)
Medication Instructions:  Your physician recommends that you continue on your current medications as directed. Please refer to the Current Medication list given to you today.  *If you need a refill on your cardiac medications before your next appointment, please call your pharmacy*   Lab Work: None If you have labs (blood work) drawn today and your tests are completely normal, you will receive your results only by: MyChart Message (if you have MyChart) OR A paper copy in the mail If you have any lab test that is abnormal or we need to change your treatment, we will call you to review the results.   Testing/Procedures: None   Follow-Up: At [facility], you and your health needs are our priority.  As part of our continuing mission to provide you with exceptional heart care, we have created designated Provider Care Teams.  These Care Teams include your primary Cardiologist (physician) and Advanced Practice Providers (APPs -  Physician Assistants and Nurse Practitioners) who all work together to provide you with the care you need, when you need it.  We recommend signing up for the patient portal called "MyChart".  Sign up information is provided on this After Visit Summary.  MyChart is used to connect with patients for Virtual Visits (Telemedicine).  Patients are able to view lab/test results, encounter notes, upcoming appointments, etc.  Non-urgent messages can be sent to your provider as well.   To learn more about what you can do with MyChart, go to [email or web address].    Your next appointment:   6 month(s)  Provider:   Jonathan Branch, MD    Other Instructions    

## 2022-08-18 ENCOUNTER — Encounter (HOSPITAL_COMMUNITY): Payer: Medicare Other

## 2022-08-19 ENCOUNTER — Encounter (HOSPITAL_COMMUNITY)
Admission: RE | Admit: 2022-08-19 | Discharge: 2022-08-19 | Disposition: A | Discharge status: Home / Self Care | Payer: Medicare Other | Source: Ambulatory Visit | Attending: Nephrology | Admitting: Nephrology

## 2022-08-19 VITALS — BP 136/71 | HR 60 | Temp 97.4°F | Resp 16

## 2022-08-19 DIAGNOSIS — N184 Chronic kidney disease, stage 4 (severe): Secondary | ICD-10-CM | POA: Diagnosis not present

## 2022-08-19 DIAGNOSIS — D631 Anemia in chronic kidney disease: Secondary | ICD-10-CM

## 2022-08-19 LAB — FERRITIN: Ferritin: 263 ng/mL (ref 24–336)

## 2022-08-19 LAB — CBC WITH DIFFERENTIAL/PLATELET
Abs Immature Granulocytes: 0.01 10*3/uL (ref 0.00–0.07)
Basophils Absolute: 0.1 10*3/uL (ref 0.0–0.1)
Basophils Relative: 1 %
Eosinophils Absolute: 0.2 10*3/uL (ref 0.0–0.5)
Eosinophils Relative: 2 %
HCT: 36.6 % — ABNORMAL LOW (ref 39.0–52.0)
Hemoglobin: 11 g/dL — ABNORMAL LOW (ref 13.0–17.0)
Immature Granulocytes: 0 %
Lymphocytes Relative: 35 %
Lymphs Abs: 2.6 10*3/uL (ref 0.7–4.0)
MCH: 29 pg (ref 26.0–34.0)
MCHC: 30.1 g/dL (ref 30.0–36.0)
MCV: 96.6 fL (ref 80.0–100.0)
Monocytes Absolute: 0.5 10*3/uL (ref 0.1–1.0)
Monocytes Relative: 7 %
Neutro Abs: 4.2 10*3/uL (ref 1.7–7.7)
Neutrophils Relative %: 55 %
Platelets: 180 10*3/uL (ref 150–400)
RBC: 3.79 MIL/uL — ABNORMAL LOW (ref 4.22–5.81)
RDW: 14.1 % (ref 11.5–15.5)
WBC: 7.6 10*3/uL (ref 4.0–10.5)
nRBC: 0 % (ref 0.0–0.2)

## 2022-08-19 LAB — IRON AND TIBC
Iron: 116 ug/dL (ref 45–182)
Saturation Ratios: 40 % — ABNORMAL HIGH (ref 17.9–39.5)
TIBC: 288 ug/dL (ref 250–450)
UIBC: 172 ug/dL

## 2022-08-19 LAB — POCT HEMOGLOBIN-HEMACUE: Hemoglobin: 10.5 g/dL — ABNORMAL LOW (ref 13.0–17.0)

## 2022-08-19 MED ORDER — CLONIDINE HCL 0.1 MG PO TABS: 0.1000 mg | ORAL_TABLET | ORAL | Status: DC | PRN

## 2022-08-19 MED ORDER — EPOETIN ALFA-EPBX 10000 UNIT/ML IJ SOLN
10000.0000 [IU] | Freq: Once | INTRAMUSCULAR | Status: AC
  Administered 2022-08-19: 10000 [IU] via SUBCUTANEOUS
  Filled 2022-08-19: qty 1

## 2022-08-19 NOTE — Progress Notes (Signed)
Diagnosis: Anemia in Chronic Kidney Disease  Provider:   Zetta Bills MD  Procedure: Injection  Retacrit (epoetin alfa-epbx), Dose: 10000 Units, Site: subcutaneous, Number of injections: 1  Hgb 10.5  Post Care: Patient declined observation  Discharge: Condition: Good, Destination: Home . AVS Provided  Performed by:  Wyvonne Lenz, RN

## 2022-08-19 NOTE — Addendum Note (Signed)
Encounter addended by: Wyvonne Lenz, RN on: 08/19/2022 2:22 PM  Actions taken: Therapy plan modified

## 2022-09-01 ENCOUNTER — Encounter (HOSPITAL_COMMUNITY): Payer: Medicare Other

## 2022-09-02 ENCOUNTER — Encounter (HOSPITAL_COMMUNITY)
Admission: RE | Admit: 2022-09-02 | Discharge: 2022-09-02 | Disposition: A | Discharge status: Home / Self Care | Payer: Medicare Other | Source: Ambulatory Visit | Attending: Nephrology | Admitting: Nephrology

## 2022-09-02 VITALS — BP 178/84 | HR 64 | Temp 97.8°F | Resp 18

## 2022-09-02 DIAGNOSIS — D631 Anemia in chronic kidney disease: Secondary | ICD-10-CM

## 2022-09-02 DIAGNOSIS — N184 Chronic kidney disease, stage 4 (severe): Secondary | ICD-10-CM | POA: Diagnosis not present

## 2022-09-02 MED ORDER — CLONIDINE HCL 0.1 MG PO TABS: 0.1000 mg | ORAL_TABLET | ORAL | Status: DC | PRN

## 2022-09-02 MED ORDER — EPOETIN ALFA-EPBX 10000 UNIT/ML IJ SOLN
10000.0000 [IU] | Freq: Once | INTRAMUSCULAR | Status: AC
  Administered 2022-09-02: 10000 [IU] via SUBCUTANEOUS
  Filled 2022-09-02: qty 1

## 2022-09-02 NOTE — Progress Notes (Signed)
Diagnosis: Anemia in Chronic Kidney Disease  Provider:   Zetta Bills MD  Procedure: Injection  Retacrit (epoetin alfa-epbx), Dose: 10000 Units, Site: subcutaneous, Number of injections: 1  Hgb 11.7  Post Care: Patient declined observation  Discharge: Condition: Good, Destination: Home . AVS Provided  Performed by:  Marin Shutter, RN

## 2022-09-06 LAB — POCT HEMOGLOBIN-HEMACUE: Hemoglobin: 11.7 g/dL — ABNORMAL LOW (ref 13.0–17.0)

## 2022-09-13 DIAGNOSIS — D519 Vitamin B12 deficiency anemia, unspecified: Secondary | ICD-10-CM | POA: Diagnosis not present

## 2022-09-14 ENCOUNTER — Encounter (HOSPITAL_COMMUNITY): Payer: Medicare Other

## 2022-09-19 DIAGNOSIS — N184 Chronic kidney disease, stage 4 (severe): Secondary | ICD-10-CM | POA: Diagnosis not present

## 2022-09-21 ENCOUNTER — Encounter (HOSPITAL_COMMUNITY)
Admission: RE | Admit: 2022-09-21 | Discharge: 2022-09-21 | Disposition: A | Discharge status: Home / Self Care | Payer: Medicare Other | Source: Ambulatory Visit | Attending: Nephrology | Admitting: Nephrology

## 2022-09-21 VITALS — BP 187/82 | HR 55 | Temp 98.0°F | Resp 18

## 2022-09-21 DIAGNOSIS — D631 Anemia in chronic kidney disease: Secondary | ICD-10-CM | POA: Diagnosis not present

## 2022-09-21 DIAGNOSIS — N184 Chronic kidney disease, stage 4 (severe): Secondary | ICD-10-CM | POA: Diagnosis not present

## 2022-09-21 LAB — IRON AND TIBC
Iron: 109 ug/dL (ref 45–182)
Saturation Ratios: 37 % (ref 17.9–39.5)
TIBC: 297 ug/dL (ref 250–450)
UIBC: 188 ug/dL

## 2022-09-21 LAB — CBC WITH DIFFERENTIAL/PLATELET
Abs Immature Granulocytes: 0.01 10*3/uL (ref 0.00–0.07)
Basophils Absolute: 0 10*3/uL (ref 0.0–0.1)
Basophils Relative: 1 %
Eosinophils Absolute: 0.1 10*3/uL (ref 0.0–0.5)
Eosinophils Relative: 2 %
HCT: 36.9 % — ABNORMAL LOW (ref 39.0–52.0)
Hemoglobin: 11 g/dL — ABNORMAL LOW (ref 13.0–17.0)
Immature Granulocytes: 0 %
Lymphocytes Relative: 34 %
Lymphs Abs: 2 10*3/uL (ref 0.7–4.0)
MCH: 28.9 pg (ref 26.0–34.0)
MCHC: 29.8 g/dL — ABNORMAL LOW (ref 30.0–36.0)
MCV: 96.9 fL (ref 80.0–100.0)
Monocytes Absolute: 0.4 10*3/uL (ref 0.1–1.0)
Monocytes Relative: 7 %
Neutro Abs: 3.4 10*3/uL (ref 1.7–7.7)
Neutrophils Relative %: 56 %
Platelets: 145 10*3/uL — ABNORMAL LOW (ref 150–400)
RBC: 3.81 MIL/uL — ABNORMAL LOW (ref 4.22–5.81)
RDW: 14.4 % (ref 11.5–15.5)
WBC: 6 10*3/uL (ref 4.0–10.5)
nRBC: 0 % (ref 0.0–0.2)

## 2022-09-21 LAB — POCT HEMOGLOBIN-HEMACUE: Hemoglobin: 11.2 g/dL — ABNORMAL LOW (ref 13.0–17.0)

## 2022-09-21 LAB — FERRITIN: Ferritin: 196 ng/mL (ref 24–336)

## 2022-09-21 MED ORDER — CLONIDINE HCL 0.1 MG PO TABS: 0.1000 mg | ORAL_TABLET | ORAL | Status: DC | PRN

## 2022-09-21 MED ORDER — EPOETIN ALFA-EPBX 10000 UNIT/ML IJ SOLN
10000.0000 [IU] | Freq: Once | INTRAMUSCULAR | Status: AC
  Administered 2022-09-21: 10000 [IU] via SUBCUTANEOUS
  Filled 2022-09-21: qty 1

## 2022-09-21 NOTE — Progress Notes (Signed)
Diagnosis: Anemia in Chronic Kidney Disease  Provider:   Zetta Bills MD  Procedure: Injection  Retacrit (epoetin alfa-epbx), Dose: 10000 Units, Site: subcutaneous, Number of injections: 1  Hgb 11.2  Post Care: Patient declined observation  Discharge: Condition: Good, Destination: Home . AVS Provided  Performed by:  Arrie Senate, RN

## 2022-09-27 DIAGNOSIS — M542 Cervicalgia: Secondary | ICD-10-CM | POA: Diagnosis not present

## 2022-09-27 DIAGNOSIS — M9902 Segmental and somatic dysfunction of thoracic region: Secondary | ICD-10-CM | POA: Diagnosis not present

## 2022-09-27 DIAGNOSIS — M9901 Segmental and somatic dysfunction of cervical region: Secondary | ICD-10-CM | POA: Diagnosis not present

## 2022-09-28 DIAGNOSIS — D631 Anemia in chronic kidney disease: Secondary | ICD-10-CM | POA: Diagnosis not present

## 2022-09-28 DIAGNOSIS — N184 Chronic kidney disease, stage 4 (severe): Secondary | ICD-10-CM | POA: Diagnosis not present

## 2022-09-28 DIAGNOSIS — N2581 Secondary hyperparathyroidism of renal origin: Secondary | ICD-10-CM | POA: Diagnosis not present

## 2022-09-28 DIAGNOSIS — E875 Hyperkalemia: Secondary | ICD-10-CM | POA: Diagnosis not present

## 2022-09-30 ENCOUNTER — Encounter (HOSPITAL_COMMUNITY): Payer: Medicare Other

## 2022-09-30 DIAGNOSIS — M9902 Segmental and somatic dysfunction of thoracic region: Secondary | ICD-10-CM | POA: Diagnosis not present

## 2022-09-30 DIAGNOSIS — M542 Cervicalgia: Secondary | ICD-10-CM | POA: Diagnosis not present

## 2022-09-30 DIAGNOSIS — M9901 Segmental and somatic dysfunction of cervical region: Secondary | ICD-10-CM | POA: Diagnosis not present

## 2022-10-05 ENCOUNTER — Encounter (HOSPITAL_COMMUNITY)
Admission: RE | Admit: 2022-10-05 | Discharge: 2022-10-05 | Disposition: A | Discharge status: Home / Self Care | Payer: Medicare Other | Source: Ambulatory Visit | Attending: Nephrology | Admitting: Nephrology

## 2022-10-05 VITALS — BP 124/79 | HR 63 | Temp 97.8°F | Resp 18

## 2022-10-05 DIAGNOSIS — N184 Chronic kidney disease, stage 4 (severe): Secondary | ICD-10-CM | POA: Insufficient documentation

## 2022-10-05 DIAGNOSIS — M9901 Segmental and somatic dysfunction of cervical region: Secondary | ICD-10-CM | POA: Diagnosis not present

## 2022-10-05 DIAGNOSIS — M542 Cervicalgia: Secondary | ICD-10-CM | POA: Diagnosis not present

## 2022-10-05 DIAGNOSIS — D631 Anemia in chronic kidney disease: Secondary | ICD-10-CM | POA: Diagnosis not present

## 2022-10-05 DIAGNOSIS — M9902 Segmental and somatic dysfunction of thoracic region: Secondary | ICD-10-CM | POA: Diagnosis not present

## 2022-10-05 LAB — POCT HEMOGLOBIN-HEMACUE: Hemoglobin: 11.5 g/dL — ABNORMAL LOW (ref 13.0–17.0)

## 2022-10-05 MED ORDER — CLONIDINE HCL 0.1 MG PO TABS: 0.1000 mg | ORAL_TABLET | ORAL | Status: DC | PRN

## 2022-10-05 MED ORDER — EPOETIN ALFA-EPBX 10000 UNIT/ML IJ SOLN
10000.0000 [IU] | Freq: Once | INTRAMUSCULAR | Status: AC
  Administered 2022-10-05: 10000 [IU] via SUBCUTANEOUS
  Filled 2022-10-05: qty 1

## 2022-10-05 NOTE — Progress Notes (Signed)
Diagnosis: Anemia in Chronic Kidney Disease  Provider:   Vonna Kotyk patel  Procedure: Injection  Retacrit (epoetin alfa-epbx), Dose: 10000 Units, Site: subcutaneous, Number of injections: 1  Post Care: Patient declined oservation  Discharge: Condition: Good, Destination: Home . AVS Declined  Performed by:  Daleen Squibb, RN

## 2022-10-12 DIAGNOSIS — N184 Chronic kidney disease, stage 4 (severe): Secondary | ICD-10-CM | POA: Diagnosis not present

## 2022-10-12 DIAGNOSIS — M9902 Segmental and somatic dysfunction of thoracic region: Secondary | ICD-10-CM | POA: Diagnosis not present

## 2022-10-12 DIAGNOSIS — I959 Hypotension, unspecified: Secondary | ICD-10-CM | POA: Diagnosis not present

## 2022-10-12 DIAGNOSIS — M542 Cervicalgia: Secondary | ICD-10-CM | POA: Diagnosis not present

## 2022-10-12 DIAGNOSIS — M9901 Segmental and somatic dysfunction of cervical region: Secondary | ICD-10-CM | POA: Diagnosis not present

## 2022-10-12 DIAGNOSIS — Z79899 Other long term (current) drug therapy: Secondary | ICD-10-CM | POA: Diagnosis not present

## 2022-10-12 DIAGNOSIS — E1129 Type 2 diabetes mellitus with other diabetic kidney complication: Secondary | ICD-10-CM | POA: Diagnosis not present

## 2022-10-14 ENCOUNTER — Encounter (HOSPITAL_COMMUNITY): Payer: Medicare Other

## 2022-10-18 DIAGNOSIS — D519 Vitamin B12 deficiency anemia, unspecified: Secondary | ICD-10-CM | POA: Diagnosis not present

## 2022-10-19 ENCOUNTER — Encounter (HOSPITAL_COMMUNITY)
Admission: RE | Admit: 2022-10-19 | Discharge: 2022-10-19 | Disposition: A | Discharge status: Home / Self Care | Payer: Medicare Other | Source: Ambulatory Visit | Attending: Nephrology | Admitting: Nephrology

## 2022-10-19 ENCOUNTER — Encounter (HOSPITAL_COMMUNITY)
Admission: RE | Admit: 2022-10-19 | Payer: Medicare Other | Source: Ambulatory Visit | Attending: Nephrology | Admitting: Nephrology

## 2022-10-19 DIAGNOSIS — M9901 Segmental and somatic dysfunction of cervical region: Secondary | ICD-10-CM | POA: Diagnosis not present

## 2022-10-19 DIAGNOSIS — N185 Chronic kidney disease, stage 5: Secondary | ICD-10-CM | POA: Diagnosis not present

## 2022-10-19 DIAGNOSIS — I951 Orthostatic hypotension: Secondary | ICD-10-CM | POA: Diagnosis not present

## 2022-10-19 DIAGNOSIS — N184 Chronic kidney disease, stage 4 (severe): Secondary | ICD-10-CM

## 2022-10-19 DIAGNOSIS — D631 Anemia in chronic kidney disease: Secondary | ICD-10-CM | POA: Diagnosis not present

## 2022-10-19 DIAGNOSIS — R7309 Other abnormal glucose: Secondary | ICD-10-CM | POA: Diagnosis not present

## 2022-10-19 DIAGNOSIS — M9902 Segmental and somatic dysfunction of thoracic region: Secondary | ICD-10-CM | POA: Diagnosis not present

## 2022-10-19 DIAGNOSIS — E1122 Type 2 diabetes mellitus with diabetic chronic kidney disease: Secondary | ICD-10-CM | POA: Diagnosis not present

## 2022-10-19 DIAGNOSIS — M542 Cervicalgia: Secondary | ICD-10-CM | POA: Diagnosis not present

## 2022-10-19 DIAGNOSIS — E875 Hyperkalemia: Secondary | ICD-10-CM | POA: Diagnosis not present

## 2022-10-19 LAB — CBC WITH DIFFERENTIAL/PLATELET
Abs Immature Granulocytes: 0.01 10*3/uL (ref 0.00–0.07)
Basophils Absolute: 0 10*3/uL (ref 0.0–0.1)
Basophils Relative: 1 %
Eosinophils Absolute: 0.1 10*3/uL (ref 0.0–0.5)
Eosinophils Relative: 2 %
HCT: 39.3 % (ref 39.0–52.0)
Hemoglobin: 11.7 g/dL — ABNORMAL LOW (ref 13.0–17.0)
Immature Granulocytes: 0 %
Lymphocytes Relative: 31 %
Lymphs Abs: 1.7 10*3/uL (ref 0.7–4.0)
MCH: 29.3 pg (ref 26.0–34.0)
MCHC: 29.8 g/dL — ABNORMAL LOW (ref 30.0–36.0)
MCV: 98.3 fL (ref 80.0–100.0)
Monocytes Absolute: 0.4 10*3/uL (ref 0.1–1.0)
Monocytes Relative: 7 %
Neutro Abs: 3.2 10*3/uL (ref 1.7–7.7)
Neutrophils Relative %: 59 %
Platelets: 147 10*3/uL — ABNORMAL LOW (ref 150–400)
RBC: 4 MIL/uL — ABNORMAL LOW (ref 4.22–5.81)
RDW: 14.3 % (ref 11.5–15.5)
WBC: 5.4 10*3/uL (ref 4.0–10.5)
nRBC: 0 % (ref 0.0–0.2)

## 2022-10-19 LAB — IRON AND TIBC
Iron: 108 ug/dL (ref 45–182)
Saturation Ratios: 38 % (ref 17.9–39.5)
TIBC: 284 ug/dL (ref 250–450)
UIBC: 176 ug/dL

## 2022-10-19 LAB — POCT HEMOGLOBIN-HEMACUE: Hemoglobin: 11.5 g/dL — ABNORMAL LOW (ref 13.0–17.0)

## 2022-10-19 LAB — FERRITIN: Ferritin: 201 ng/mL (ref 24–336)

## 2022-10-19 MED ORDER — EPOETIN ALFA-EPBX 10000 UNIT/ML IJ SOLN
10000.0000 [IU] | Freq: Once | INTRAMUSCULAR | Status: DC
  Administered 2022-10-19: 10000 [IU] via SUBCUTANEOUS

## 2022-10-19 MED ORDER — CLONIDINE HCL 0.1 MG PO TABS: 0.1000 mg | ORAL_TABLET | ORAL | Status: DC | PRN

## 2022-10-19 NOTE — Addendum Note (Signed)
Encounter addended by: Orson Aloe, [facility] on: 10/19/2022 10:43 AM  Actions taken: Order list changed

## 2022-10-19 NOTE — Progress Notes (Signed)
Diagnosis: Anemia in Chronic Kidney Disease  Provider:  Trena Platt MD  Procedure: Injection  Retacrit (epoetin alfa-epbx), Dose: 10000 Units, Site: subcutaneous, Number of injections: 1  Hgb. 11.5  Post Care: Observation period completed  Discharge: Condition: Good, Destination: Home . AVS Declined  Performed by:  Daleen Squibb, RN

## 2022-10-28 ENCOUNTER — Encounter (HOSPITAL_COMMUNITY): Payer: Medicare Other

## 2022-11-02 ENCOUNTER — Encounter (HOSPITAL_COMMUNITY)
Admission: RE | Admit: 2022-11-02 | Discharge: 2022-11-02 | Disposition: A | Discharge status: Home / Self Care | Payer: Medicare Other | Source: Ambulatory Visit | Attending: Nephrology | Admitting: Nephrology

## 2022-11-02 VITALS — BP 170/90 | HR 55 | Temp 97.4°F | Resp 20

## 2022-11-02 DIAGNOSIS — M9902 Segmental and somatic dysfunction of thoracic region: Secondary | ICD-10-CM | POA: Diagnosis not present

## 2022-11-02 DIAGNOSIS — D631 Anemia in chronic kidney disease: Secondary | ICD-10-CM

## 2022-11-02 DIAGNOSIS — N184 Chronic kidney disease, stage 4 (severe): Secondary | ICD-10-CM | POA: Diagnosis not present

## 2022-11-02 DIAGNOSIS — M9901 Segmental and somatic dysfunction of cervical region: Secondary | ICD-10-CM | POA: Diagnosis not present

## 2022-11-02 DIAGNOSIS — M542 Cervicalgia: Secondary | ICD-10-CM | POA: Diagnosis not present

## 2022-11-02 LAB — POCT HEMOGLOBIN-HEMACUE: Hemoglobin: 12.9 g/dL — ABNORMAL LOW (ref 13.0–17.0)

## 2022-11-02 MED ORDER — CLONIDINE HCL 0.1 MG PO TABS: 0.1000 mg | ORAL_TABLET | ORAL | Status: DC | PRN

## 2022-11-02 MED ORDER — EPOETIN ALFA-EPBX 10000 UNIT/ML IJ SOLN: 10000.0000 [IU] | Freq: Once | INTRAMUSCULAR | Status: DC

## 2022-11-02 NOTE — Addendum Note (Signed)
Encounter addended by: Orson Aloe, [facility] on: 11/02/2022 12:17 PM  Actions taken: Order list changed

## 2022-11-02 NOTE — Progress Notes (Signed)
Diagnosis: Anemia in Chronic Kidney Disease  Provider:  Trena Platt MD  Procedure: Injection  Retacrit (epoetin alfa-epbx), Dose: 10000 Units, Site: subcutaneous, Number of injections: 0  Hgb. 12.9 no injection needed  Post Care: observation period completed  Discharge: Condition: Good, Destination: Home . AVS Declined  Performed by:  Arrie Senate, RN

## 2022-11-09 DIAGNOSIS — E875 Hyperkalemia: Secondary | ICD-10-CM | POA: Diagnosis not present

## 2022-11-16 ENCOUNTER — Encounter: Payer: Medicare Other | Attending: Nephrology | Admitting: Emergency Medicine

## 2022-11-16 VITALS — BP 171/91 | HR 52 | Temp 98.2°F | Resp 18

## 2022-11-16 DIAGNOSIS — M9902 Segmental and somatic dysfunction of thoracic region: Secondary | ICD-10-CM | POA: Diagnosis not present

## 2022-11-16 DIAGNOSIS — N184 Chronic kidney disease, stage 4 (severe): Secondary | ICD-10-CM | POA: Insufficient documentation

## 2022-11-16 DIAGNOSIS — M9901 Segmental and somatic dysfunction of cervical region: Secondary | ICD-10-CM | POA: Diagnosis not present

## 2022-11-16 DIAGNOSIS — M542 Cervicalgia: Secondary | ICD-10-CM | POA: Diagnosis not present

## 2022-11-16 DIAGNOSIS — D631 Anemia in chronic kidney disease: Secondary | ICD-10-CM | POA: Diagnosis not present

## 2022-11-16 MED ORDER — EPOETIN ALFA-EPBX 10000 UNIT/ML IJ SOLN: 10000.0000 [IU] | Freq: Once | INTRAMUSCULAR | Status: DC

## 2022-11-16 NOTE — Progress Notes (Signed)
Diagnosis: Anemia in Chronic Kidney Disease  Provider:  Zetta Bills MD  Procedure: Injection  Epogen (epoetin alfa), Dose: 10000 Units, Site: subcutaneous, Number of injections: 0  Hemoglobin 12.7   Post Care: Patient declined observation  Discharge: Condition: Good, Destination: Home . AVS Provided  Performed by:  Arrie Senate, RN

## 2022-11-16 NOTE — Addendum Note (Signed)
Addended by: Tad Moore on: 11/16/2022 12:02 PM   Modules accepted: Orders

## 2022-11-17 LAB — RENAL FUNCTION PANEL
Albumin: 4.4 g/dL (ref 3.6–5.1)
BUN/Creatinine Ratio: 12 (calc) (ref 6–22)
BUN: 44 mg/dL — ABNORMAL HIGH (ref 7–25)
CO2: 21 mmol/L (ref 20–32)
Calcium: 9.4 mg/dL (ref 8.6–10.3)
Chloride: 109 mmol/L (ref 98–110)
Creat: 3.53 mg/dL — ABNORMAL HIGH (ref 0.70–1.22)
Glucose, Bld: 129 mg/dL — ABNORMAL HIGH (ref 65–99)
Phosphorus: 3.9 mg/dL (ref 2.1–4.3)
Potassium: 4.9 mmol/L (ref 3.5–5.3)
Sodium: 141 mmol/L (ref 135–146)

## 2022-11-17 LAB — IRON,TIBC AND FERRITIN PANEL
%SAT: 39 % (ref 20–48)
Ferritin: 224 ng/mL (ref 24–380)
Iron: 108 ug/dL (ref 50–180)
TIBC: 276 ug/dL (ref 250–425)

## 2022-11-17 LAB — PTH, INTACT AND CALCIUM
Calcium: 9.4 mg/dL (ref 8.6–10.3)
PTH: 271 pg/mL — ABNORMAL HIGH (ref 16–77)

## 2022-11-22 ENCOUNTER — Telehealth: Payer: Self-pay | Admitting: Pharmacy Technician

## 2022-11-22 DIAGNOSIS — D519 Vitamin B12 deficiency anemia, unspecified: Secondary | ICD-10-CM | POA: Diagnosis not present

## 2022-11-22 NOTE — Telephone Encounter (Signed)
Auth Submission: APPROVED Site of care: Site of care: [facility] Payer: BCBS Medication & CPT/J Code(s) submitted: RETACRIT [id number] Route of submission (phone, fax, portal):  Phone # Fax # Auth type: Buy/Bill PB Units/visits requested: 26 DOSES- 10,000 UNITS Q2WKS Reference number: [id number] Approval from: 11/22/22 to 11/21/23

## 2022-11-30 ENCOUNTER — Encounter: Payer: Medicare Other | Admitting: Internal Medicine

## 2022-11-30 VITALS — BP 184/92 | HR 51 | Temp 97.7°F | Resp 16

## 2022-11-30 DIAGNOSIS — D631 Anemia in chronic kidney disease: Secondary | ICD-10-CM

## 2022-11-30 MED ORDER — EPOETIN ALFA-EPBX 10000 UNIT/ML IJ SOLN: 10000.0000 [IU] | Freq: Once | INTRAMUSCULAR | Status: DC

## 2022-11-30 NOTE — Progress Notes (Signed)
Diagnosis: Anemia in Chronic Kidney Disease, Iron Deficiency Anemia  Provider:  Carylon Perches MD  Procedure: Injection  Retacrit (epoetin alfa-epbx), Dose: not given , Site: subcutaneous, Number of injections: 0 HBG 12.2 Post Care:  Observation period declined  Discharge: Condition: Good, Destination: Home . AVS Provided  Performed by:  Feliberto Harts, LPN

## 2022-12-07 ENCOUNTER — Encounter: Payer: Self-pay | Admitting: Nephrology

## 2022-12-07 NOTE — Addendum Note (Signed)
Addended by: Marin Shutter E on: 12/07/2022 02:42 PM   Modules accepted: Orders

## 2022-12-14 ENCOUNTER — Other Ambulatory Visit: Payer: Self-pay | Admitting: Emergency Medicine

## 2022-12-14 ENCOUNTER — Encounter: Payer: Medicare Other | Attending: Nephrology | Admitting: *Deleted

## 2022-12-14 VITALS — BP 153/89 | HR 58 | Temp 97.7°F | Resp 18

## 2022-12-14 DIAGNOSIS — N184 Chronic kidney disease, stage 4 (severe): Secondary | ICD-10-CM | POA: Insufficient documentation

## 2022-12-14 DIAGNOSIS — M542 Cervicalgia: Secondary | ICD-10-CM | POA: Diagnosis not present

## 2022-12-14 DIAGNOSIS — M9902 Segmental and somatic dysfunction of thoracic region: Secondary | ICD-10-CM | POA: Diagnosis not present

## 2022-12-14 DIAGNOSIS — D631 Anemia in chronic kidney disease: Secondary | ICD-10-CM | POA: Diagnosis not present

## 2022-12-14 DIAGNOSIS — M9901 Segmental and somatic dysfunction of cervical region: Secondary | ICD-10-CM | POA: Diagnosis not present

## 2022-12-14 MED ORDER — EPOETIN ALFA-EPBX 10000 UNIT/ML IJ SOLN: 10000.0000 [IU] | Freq: Once | INTRAMUSCULAR | Status: DC

## 2022-12-14 NOTE — Progress Notes (Signed)
Hemoglobin was 12.0 and no injection was needed.

## 2022-12-16 LAB — IRON,TIBC AND FERRITIN PANEL
%SAT: 49 % — ABNORMAL HIGH (ref 20–48)
Ferritin: 271 ng/mL (ref 24–380)
Iron: 135 ug/dL (ref 50–180)
TIBC: 273 ug/dL (ref 250–425)

## 2022-12-28 DIAGNOSIS — N184 Chronic kidney disease, stage 4 (severe): Secondary | ICD-10-CM | POA: Diagnosis not present

## 2022-12-28 DIAGNOSIS — N189 Chronic kidney disease, unspecified: Secondary | ICD-10-CM | POA: Diagnosis not present

## 2022-12-28 DIAGNOSIS — N2581 Secondary hyperparathyroidism of renal origin: Secondary | ICD-10-CM | POA: Diagnosis not present

## 2022-12-28 DIAGNOSIS — R053 Chronic cough: Secondary | ICD-10-CM | POA: Diagnosis not present

## 2022-12-28 DIAGNOSIS — D631 Anemia in chronic kidney disease: Secondary | ICD-10-CM | POA: Diagnosis not present

## 2023-01-04 ENCOUNTER — Encounter: Payer: Medicare Other | Attending: Nephrology | Admitting: *Deleted

## 2023-01-04 VITALS — BP 103/71 | HR 59 | Temp 97.8°F | Resp 18

## 2023-01-04 DIAGNOSIS — Z23 Encounter for immunization: Secondary | ICD-10-CM | POA: Diagnosis not present

## 2023-01-04 DIAGNOSIS — D631 Anemia in chronic kidney disease: Secondary | ICD-10-CM | POA: Diagnosis not present

## 2023-01-04 DIAGNOSIS — N184 Chronic kidney disease, stage 4 (severe): Secondary | ICD-10-CM | POA: Diagnosis not present

## 2023-01-04 LAB — GLUCOSE HEMOCUE WAIVED: Hemoglobin: 11.4

## 2023-01-04 MED ORDER — EPOETIN ALFA-EPBX 10000 UNIT/ML IJ SOLN
10000.0000 [IU] | Freq: Once | INTRAMUSCULAR | Status: AC
  Administered 2023-01-04: 10000 [IU] via SUBCUTANEOUS

## 2023-01-04 NOTE — Progress Notes (Signed)
Diagnosis: Anemia in Chronic Kidney Disease  Provider:   Zetta Bills, MD  Procedure: Injection  Retacrit (epoetin alfa-epbx), Dose: 10000 Units, Site: subcutaneous, Number of injections: 1  Hgb. 11.4 Left arm  Post Care: Observation period completed  Discharge: Condition: Good, Destination: Home . AVS Provided  Performed by:  Daleen Squibb, RN

## 2023-01-11 DIAGNOSIS — H40013 Open angle with borderline findings, low risk, bilateral: Secondary | ICD-10-CM | POA: Diagnosis not present

## 2023-01-11 DIAGNOSIS — E1129 Type 2 diabetes mellitus with other diabetic kidney complication: Secondary | ICD-10-CM | POA: Diagnosis not present

## 2023-01-18 DIAGNOSIS — E1122 Type 2 diabetes mellitus with diabetic chronic kidney disease: Secondary | ICD-10-CM | POA: Diagnosis not present

## 2023-01-18 DIAGNOSIS — M542 Cervicalgia: Secondary | ICD-10-CM | POA: Diagnosis not present

## 2023-01-18 DIAGNOSIS — N184 Chronic kidney disease, stage 4 (severe): Secondary | ICD-10-CM | POA: Diagnosis not present

## 2023-01-18 DIAGNOSIS — M9901 Segmental and somatic dysfunction of cervical region: Secondary | ICD-10-CM | POA: Diagnosis not present

## 2023-01-18 DIAGNOSIS — M9902 Segmental and somatic dysfunction of thoracic region: Secondary | ICD-10-CM | POA: Diagnosis not present

## 2023-01-23 DIAGNOSIS — H524 Presbyopia: Secondary | ICD-10-CM | POA: Diagnosis not present

## 2023-01-31 DIAGNOSIS — D519 Vitamin B12 deficiency anemia, unspecified: Secondary | ICD-10-CM | POA: Diagnosis not present

## 2023-02-15 DIAGNOSIS — M542 Cervicalgia: Secondary | ICD-10-CM | POA: Diagnosis not present

## 2023-02-15 DIAGNOSIS — M9902 Segmental and somatic dysfunction of thoracic region: Secondary | ICD-10-CM | POA: Diagnosis not present

## 2023-02-15 DIAGNOSIS — M9901 Segmental and somatic dysfunction of cervical region: Secondary | ICD-10-CM | POA: Diagnosis not present

## 2023-02-16 ENCOUNTER — Encounter: Payer: Self-pay | Admitting: Cardiology

## 2023-02-16 ENCOUNTER — Ambulatory Visit: Payer: Medicare Other | Attending: Cardiology | Admitting: Cardiology

## 2023-02-16 VITALS — BP 126/74 | HR 53 | Ht 71.0 in | Wt 181.0 lb

## 2023-02-16 DIAGNOSIS — R001 Bradycardia, unspecified: Secondary | ICD-10-CM

## 2023-02-16 DIAGNOSIS — E782 Mixed hyperlipidemia: Secondary | ICD-10-CM | POA: Diagnosis not present

## 2023-02-16 DIAGNOSIS — I951 Orthostatic hypotension: Secondary | ICD-10-CM

## 2023-02-16 NOTE — Patient Instructions (Addendum)

## 2023-02-16 NOTE — Progress Notes (Signed)
Clinical Summary Eric Brady is a 89 y.o.male seen today for follow up of the following medical problems      1. Dizzy spells/Orthostatic hypotension - previously found to be severely orthostatic in clinic, SBP dropped 43 points with standing.  - has done well on midodrine and florinef       - since our last visit appears off florinef, unclear history - midodrine was lowered to 5mg  tid during 03/2022 admission with pneumonia     - some dizzinzess at times though overall infrequent - working to stay hydrated. Taking midodrine 5mg  tid   2. Prostate cancer - complete radiation, completed hormone therapy.      3. Bradycardia Mild sinus brady, chronic for several years.  - 03/2019 monitor AV HR 52, reported symptoms correlated with SR and mild sinus brady        4. CKD - followed by nephrology  5. HLD -10/2022 TC 136 TG 150 HDL 46 LDL 64   Past Medical History:  Diagnosis Date   Anemia    Asthma    only as a child   Cancer (HCC)    Prostate   Diabetes mellitus without complication (HCC)    History of radiation therapy    Hyperlipidemia    Squamous cell carcinoma of skin 08/12/2019   in situ on right ear rim(CX3&EXC)     Not on File   Current Outpatient Medications  Medication Sig Dispense Refill   acetaminophen (TYLENOL) 650 MG CR tablet Take 650 mg by mouth in the morning and at bedtime. Takes two tablets     aspirin EC 81 MG tablet Take 81 mg by mouth daily. Swallow whole.     atorvastatin (LIPITOR) 10 MG tablet Take 10 mg by mouth every evening.     calcium-vitamin D (OSCAL WITH D) 500-200 MG-UNIT tablet Take 2 tablets by mouth daily.      cholecalciferol (VITAMIN D3) 25 MCG (1000 UNIT) tablet Take 1,000 Units by mouth daily.     clobetasol (OLUX) 0.05 % topical foam Apply topically 2 (two) times daily. APPLY TO THE SCALP TWICE DAILY FOR 1 MONTH 50 g 2   cyanocobalamin (,VITAMIN B-12,) 1000 MCG/ML injection Inject 1,000 mcg into the muscle every 30  (thirty) days.     cyclobenzaprine (FLEXERIL) 10 MG tablet Take 1 tablet (10 mg total) by mouth 3 (three) times daily as needed for muscle spasms. 30 tablet 0   EPINEPHrine (EPIPEN 2-PAK) 0.3 mg/0.3 mL IJ SOAJ injection Inject 0.3 mg into the muscle once as needed for anaphylaxis. 2 each 1   epoetin alfa-epbx (RETACRIT) 3000 UNIT/ML injection Inject 3,000 Units into the skin 3 (three) times a week.     ferrous sulfate 325 (65 FE) MG tablet 325 mg every other day.     metFORMIN (GLUCOPHAGE-XR) 500 MG 24 hr tablet Take 1,000 mg by mouth every evening.  11   midodrine (PROAMATINE) 5 MG tablet Take 1 tablet (5 mg) in the morning. Take 1 tablet (5 mg) in the afternoon. Take 2 tablets (10 mg) at night. 180 tablet 3   Multiple Vitamin (MULTIVITAMIN) capsule Take 1 capsule by mouth daily.     ONETOUCH VERIO test strip 1 each daily.     pioglitazone (ACTOS) 15 MG tablet Take 15 mg by mouth every evening.     sodium bicarbonate 650 MG tablet Take 650 mg by mouth 3 (three) times daily.     VELTASSA 8.4 g packet 8.4 g.  No current facility-administered medications for this visit.     Past Surgical History:  Procedure Laterality Date   COLONOSCOPY N/A 09/12/2012   Procedure: COLONOSCOPY;  Surgeon: Malissa Hippo, MD;  Location: [facility]ITE;  Service: Endoscopy;  Laterality: N/A;  915   EYE SURGERY     LUMBAR LAMINECTOMY/DECOMPRESSION MICRODISCECTOMY Right 10/26/2020   Procedure: Microdiscectomy - Lumbar Three-Four Right;  Surgeon: Donalee Citrin, MD;  Location[facility];  Service: Neurosurgery;  Laterality: Right;  3C   ORIF ANKLE FRACTURE Right    YAG LASER APPLICATION Left 10/19/2015   Procedure: YAG LASER APPLICATION;  Surgeon: Susa Simmonds, MD;  Location: [facility] ORS;  Service: Ophthalmology;  Laterality: Left;     Not on File    Family History  Problem Relation Age of Onset   Cancer Mother    Prostate cancer Brother      Social History Eric Brady reports that he quit smoking about 32  years ago. His smoking use included cigarettes. He has never used smokeless tobacco. Eric Brady reports no history of alcohol use.   Review of Systems CONSTITUTIONAL: No weight loss, fever, chills, weakness or fatigue.  HEENT: Eyes: No visual loss, blurred vision, double vision or yellow sclerae.No hearing loss, sneezing, congestion, runny nose or sore throat.  SKIN: No rash or itching.  CARDIOVASCULAR: per hpi RESPIRATORY: No shortness of breath, cough or sputum.  GASTROINTESTINAL: No anorexia, nausea, vomiting or diarrhea. No abdominal pain or blood.  GENITOURINARY: No burning on urination, no polyuria NEUROLOGICAL: No headache, dizziness, syncope, paralysis, ataxia, numbness or tingling in the extremities. No change in bowel or bladder control.  MUSCULOSKELETAL: No muscle, back pain, joint pain or stiffness.  LYMPHATICS: No enlarged nodes. No history of splenectomy.  PSYCHIATRIC: No history of depression or anxiety.  ENDOCRINOLOGIC: No reports of sweating, cold or heat intolerance. No polyuria or polydipsia.  Marland Kitchen   Physical Examination Today's Vitals   02/16/23 1124  BP: 126/74  Pulse: (!) 53  SpO2: 98%  Weight: 181 lb (82.1 kg)  Height: 5\' 11"  (1.803 m)   Body mass index is 25.24 kg/m.  Gen: resting comfortably, no acute distress HEENT: no scleral icterus, pupils equal round and reactive, no palptable cervical adenopathy,  CV: RRR, no m/rg, no jvd Resp: Clear to auscultation bilaterally GI: abdomen is soft, non-tender, non-distended, normal bowel sounds, no hepatosplenomegaly MSK: extremities are warm, no edema.  Skin: warm, no rash Neuro:  no focal deficits Psych: appropriate affect   Diagnostic Studies  02/2018 echo Study Conclusions   - Left ventricle: The cavity size was normal. Systolic function was   normal. The estimated ejection fraction was in the range of 60%   to 65%. Wall motion was normal; there were no regional wall   motion abnormalities. Left  ventricular diastolic function   parameters were normal. - Aortic valve: There was very mild stenosis. Valve area (Vmax):   2.29 cm^2. - Mitral valve: Moderately calcified annulus. Moderately thickened   leaflets . - Left atrium: The atrium was mildly dilated. - Atrial septum: No defect or patent foramen ovale was identified.       Assessment and Plan   1. Dizziness/Orhotstatic hypotension - overall doing well, continue current therapy   2. Bradycardia - chronic and asymptoamtic - EKG today shows sinus brady 53 - continue to monitor  3. HLD - LDL 64 is at goal, continue current meds   F/u 1 year     Antoine Poche, M.D.

## 2023-03-14 DIAGNOSIS — D519 Vitamin B12 deficiency anemia, unspecified: Secondary | ICD-10-CM | POA: Diagnosis not present

## 2023-03-15 DIAGNOSIS — M9901 Segmental and somatic dysfunction of cervical region: Secondary | ICD-10-CM | POA: Diagnosis not present

## 2023-03-15 DIAGNOSIS — M9902 Segmental and somatic dysfunction of thoracic region: Secondary | ICD-10-CM | POA: Diagnosis not present

## 2023-03-15 DIAGNOSIS — M542 Cervicalgia: Secondary | ICD-10-CM | POA: Diagnosis not present

## 2023-03-21 DIAGNOSIS — N184 Chronic kidney disease, stage 4 (severe): Secondary | ICD-10-CM | POA: Diagnosis not present

## 2023-03-31 DIAGNOSIS — N184 Chronic kidney disease, stage 4 (severe): Secondary | ICD-10-CM | POA: Diagnosis not present

## 2023-03-31 DIAGNOSIS — N390 Urinary tract infection, site not specified: Secondary | ICD-10-CM | POA: Diagnosis not present

## 2023-03-31 DIAGNOSIS — D631 Anemia in chronic kidney disease: Secondary | ICD-10-CM | POA: Diagnosis not present

## 2023-03-31 DIAGNOSIS — N2581 Secondary hyperparathyroidism of renal origin: Secondary | ICD-10-CM | POA: Diagnosis not present

## 2023-04-10 DIAGNOSIS — N189 Chronic kidney disease, unspecified: Secondary | ICD-10-CM | POA: Diagnosis not present

## 2023-04-10 DIAGNOSIS — I517 Cardiomegaly: Secondary | ICD-10-CM | POA: Diagnosis not present

## 2023-04-10 DIAGNOSIS — R4182 Altered mental status, unspecified: Secondary | ICD-10-CM | POA: Diagnosis not present

## 2023-04-10 DIAGNOSIS — E1122 Type 2 diabetes mellitus with diabetic chronic kidney disease: Secondary | ICD-10-CM | POA: Diagnosis not present

## 2023-04-10 DIAGNOSIS — R140 Abdominal distension (gaseous): Secondary | ICD-10-CM | POA: Diagnosis not present

## 2023-04-10 DIAGNOSIS — W08XXXA Fall from other furniture, initial encounter: Secondary | ICD-10-CM | POA: Diagnosis not present

## 2023-04-10 DIAGNOSIS — S199XXA Unspecified injury of neck, initial encounter: Secondary | ICD-10-CM | POA: Diagnosis not present

## 2023-04-10 DIAGNOSIS — I129 Hypertensive chronic kidney disease with stage 1 through stage 4 chronic kidney disease, or unspecified chronic kidney disease: Secondary | ICD-10-CM | POA: Diagnosis not present

## 2023-04-10 DIAGNOSIS — N179 Acute kidney failure, unspecified: Secondary | ICD-10-CM | POA: Diagnosis not present

## 2023-04-10 DIAGNOSIS — R1084 Generalized abdominal pain: Secondary | ICD-10-CM | POA: Diagnosis not present

## 2023-04-10 DIAGNOSIS — N289 Disorder of kidney and ureter, unspecified: Secondary | ICD-10-CM | POA: Diagnosis not present

## 2023-04-10 DIAGNOSIS — E8729 Other acidosis: Secondary | ICD-10-CM | POA: Diagnosis not present

## 2023-04-10 DIAGNOSIS — R062 Wheezing: Secondary | ICD-10-CM | POA: Diagnosis not present

## 2023-04-10 DIAGNOSIS — J9601 Acute respiratory failure with hypoxia: Secondary | ICD-10-CM | POA: Diagnosis not present

## 2023-04-10 DIAGNOSIS — I5A Non-ischemic myocardial injury (non-traumatic): Secondary | ICD-10-CM | POA: Diagnosis not present

## 2023-04-10 DIAGNOSIS — F05 Delirium due to known physiological condition: Secondary | ICD-10-CM | POA: Diagnosis not present

## 2023-04-10 DIAGNOSIS — M47812 Spondylosis without myelopathy or radiculopathy, cervical region: Secondary | ICD-10-CM | POA: Diagnosis not present

## 2023-04-10 DIAGNOSIS — M25551 Pain in right hip: Secondary | ICD-10-CM | POA: Diagnosis not present

## 2023-04-10 DIAGNOSIS — E860 Dehydration: Secondary | ICD-10-CM | POA: Diagnosis not present

## 2023-04-10 DIAGNOSIS — R197 Diarrhea, unspecified: Secondary | ICD-10-CM | POA: Diagnosis not present

## 2023-04-10 DIAGNOSIS — G9341 Metabolic encephalopathy: Secondary | ICD-10-CM | POA: Diagnosis not present

## 2023-04-10 DIAGNOSIS — Z66 Do not resuscitate: Secondary | ICD-10-CM | POA: Diagnosis not present

## 2023-04-10 DIAGNOSIS — U071 COVID-19: Secondary | ICD-10-CM | POA: Diagnosis not present

## 2023-04-10 DIAGNOSIS — S0990XA Unspecified injury of head, initial encounter: Secondary | ICD-10-CM | POA: Diagnosis not present

## 2023-04-10 DIAGNOSIS — R531 Weakness: Secondary | ICD-10-CM | POA: Diagnosis not present

## 2023-04-10 DIAGNOSIS — J690 Pneumonitis due to inhalation of food and vomit: Secondary | ICD-10-CM | POA: Diagnosis not present

## 2023-04-10 DIAGNOSIS — D631 Anemia in chronic kidney disease: Secondary | ICD-10-CM | POA: Diagnosis not present

## 2023-04-10 DIAGNOSIS — R630 Anorexia: Secondary | ICD-10-CM | POA: Diagnosis not present

## 2023-04-10 DIAGNOSIS — A419 Sepsis, unspecified organism: Secondary | ICD-10-CM | POA: Diagnosis not present

## 2023-04-10 DIAGNOSIS — R570 Cardiogenic shock: Secondary | ICD-10-CM | POA: Diagnosis not present

## 2023-04-10 DIAGNOSIS — R54 Age-related physical debility: Secondary | ICD-10-CM | POA: Diagnosis not present

## 2023-04-10 DIAGNOSIS — W19XXXA Unspecified fall, initial encounter: Secondary | ICD-10-CM | POA: Diagnosis not present

## 2023-04-10 DIAGNOSIS — R1312 Dysphagia, oropharyngeal phase: Secondary | ICD-10-CM | POA: Diagnosis not present

## 2023-04-10 DIAGNOSIS — D638 Anemia in other chronic diseases classified elsewhere: Secondary | ICD-10-CM | POA: Diagnosis not present

## 2023-04-10 DIAGNOSIS — D72829 Elevated white blood cell count, unspecified: Secondary | ICD-10-CM | POA: Diagnosis not present

## 2023-04-10 DIAGNOSIS — E875 Hyperkalemia: Secondary | ICD-10-CM | POA: Diagnosis not present

## 2023-04-10 DIAGNOSIS — R0602 Shortness of breath: Secondary | ICD-10-CM | POA: Diagnosis not present

## 2023-04-10 DIAGNOSIS — E871 Hypo-osmolality and hyponatremia: Secondary | ICD-10-CM | POA: Diagnosis not present

## 2023-04-10 DIAGNOSIS — N1330 Unspecified hydronephrosis: Secondary | ICD-10-CM | POA: Diagnosis not present

## 2023-04-10 DIAGNOSIS — E874 Mixed disorder of acid-base balance: Secondary | ICD-10-CM | POA: Diagnosis not present

## 2023-04-10 DIAGNOSIS — Z4682 Encounter for fitting and adjustment of non-vascular catheter: Secondary | ICD-10-CM | POA: Diagnosis not present

## 2023-04-10 DIAGNOSIS — R410 Disorientation, unspecified: Secondary | ICD-10-CM | POA: Diagnosis not present

## 2023-04-10 DIAGNOSIS — R6521 Severe sepsis with septic shock: Secondary | ICD-10-CM | POA: Diagnosis not present

## 2023-04-10 DIAGNOSIS — N184 Chronic kidney disease, stage 4 (severe): Secondary | ICD-10-CM | POA: Diagnosis not present

## 2023-04-10 DIAGNOSIS — R509 Fever, unspecified: Secondary | ICD-10-CM | POA: Diagnosis not present

## 2023-04-10 DIAGNOSIS — I959 Hypotension, unspecified: Secondary | ICD-10-CM | POA: Diagnosis not present

## 2023-04-10 DIAGNOSIS — N390 Urinary tract infection, site not specified: Secondary | ICD-10-CM | POA: Diagnosis not present

## 2023-04-10 DIAGNOSIS — R918 Other nonspecific abnormal finding of lung field: Secondary | ICD-10-CM | POA: Diagnosis not present

## 2023-04-10 DIAGNOSIS — R579 Shock, unspecified: Secondary | ICD-10-CM | POA: Diagnosis not present

## 2023-04-11 DIAGNOSIS — N179 Acute kidney failure, unspecified: Secondary | ICD-10-CM | POA: Diagnosis not present

## 2023-04-11 DIAGNOSIS — M25551 Pain in right hip: Secondary | ICD-10-CM | POA: Diagnosis not present

## 2023-04-11 DIAGNOSIS — R531 Weakness: Secondary | ICD-10-CM | POA: Diagnosis not present

## 2023-04-11 DIAGNOSIS — R509 Fever, unspecified: Secondary | ICD-10-CM | POA: Diagnosis not present

## 2023-04-11 DIAGNOSIS — R0602 Shortness of breath: Secondary | ICD-10-CM | POA: Diagnosis not present

## 2023-04-11 DIAGNOSIS — R140 Abdominal distension (gaseous): Secondary | ICD-10-CM | POA: Diagnosis not present

## 2023-04-11 DIAGNOSIS — M47812 Spondylosis without myelopathy or radiculopathy, cervical region: Secondary | ICD-10-CM | POA: Diagnosis not present

## 2023-04-11 DIAGNOSIS — W19XXXA Unspecified fall, initial encounter: Secondary | ICD-10-CM | POA: Diagnosis not present

## 2023-04-11 DIAGNOSIS — N1330 Unspecified hydronephrosis: Secondary | ICD-10-CM | POA: Diagnosis not present

## 2023-04-11 DIAGNOSIS — S0990XA Unspecified injury of head, initial encounter: Secondary | ICD-10-CM | POA: Diagnosis not present

## 2023-04-11 DIAGNOSIS — S199XXA Unspecified injury of neck, initial encounter: Secondary | ICD-10-CM | POA: Diagnosis not present

## 2023-04-11 DIAGNOSIS — R062 Wheezing: Secondary | ICD-10-CM | POA: Diagnosis not present

## 2023-04-12 DIAGNOSIS — M47812 Spondylosis without myelopathy or radiculopathy, cervical region: Secondary | ICD-10-CM | POA: Diagnosis not present

## 2023-04-12 DIAGNOSIS — S199XXA Unspecified injury of neck, initial encounter: Secondary | ICD-10-CM | POA: Diagnosis not present

## 2023-04-12 DIAGNOSIS — N179 Acute kidney failure, unspecified: Secondary | ICD-10-CM | POA: Diagnosis not present

## 2023-04-12 DIAGNOSIS — R0602 Shortness of breath: Secondary | ICD-10-CM | POA: Diagnosis not present

## 2023-04-13 DIAGNOSIS — R0602 Shortness of breath: Secondary | ICD-10-CM | POA: Diagnosis not present

## 2023-04-13 DIAGNOSIS — N179 Acute kidney failure, unspecified: Secondary | ICD-10-CM | POA: Diagnosis not present

## 2023-04-14 DIAGNOSIS — N179 Acute kidney failure, unspecified: Secondary | ICD-10-CM | POA: Diagnosis not present

## 2023-04-14 DIAGNOSIS — R0602 Shortness of breath: Secondary | ICD-10-CM | POA: Diagnosis not present

## 2023-04-15 DIAGNOSIS — W08XXXA Fall from other furniture, initial encounter: Secondary | ICD-10-CM | POA: Diagnosis not present

## 2023-04-15 DIAGNOSIS — N179 Acute kidney failure, unspecified: Secondary | ICD-10-CM | POA: Diagnosis not present

## 2023-04-15 DIAGNOSIS — R0602 Shortness of breath: Secondary | ICD-10-CM | POA: Diagnosis not present

## 2023-04-16 DIAGNOSIS — N179 Acute kidney failure, unspecified: Secondary | ICD-10-CM | POA: Diagnosis not present

## 2023-04-16 DIAGNOSIS — W08XXXA Fall from other furniture, initial encounter: Secondary | ICD-10-CM | POA: Diagnosis not present

## 2023-04-16 DIAGNOSIS — R0602 Shortness of breath: Secondary | ICD-10-CM | POA: Diagnosis not present

## 2023-04-17 DIAGNOSIS — U071 COVID-19: Secondary | ICD-10-CM | POA: Diagnosis not present

## 2023-04-17 DIAGNOSIS — R918 Other nonspecific abnormal finding of lung field: Secondary | ICD-10-CM | POA: Diagnosis not present

## 2023-04-17 DIAGNOSIS — N179 Acute kidney failure, unspecified: Secondary | ICD-10-CM | POA: Diagnosis not present

## 2023-04-17 DIAGNOSIS — W19XXXA Unspecified fall, initial encounter: Secondary | ICD-10-CM | POA: Diagnosis not present

## 2023-04-17 DIAGNOSIS — R0602 Shortness of breath: Secondary | ICD-10-CM | POA: Diagnosis not present

## 2023-04-18 DIAGNOSIS — W19XXXA Unspecified fall, initial encounter: Secondary | ICD-10-CM | POA: Diagnosis not present

## 2023-04-18 DIAGNOSIS — N179 Acute kidney failure, unspecified: Secondary | ICD-10-CM | POA: Diagnosis not present

## 2023-04-18 DIAGNOSIS — U071 COVID-19: Secondary | ICD-10-CM | POA: Diagnosis not present

## 2023-04-18 DIAGNOSIS — R0602 Shortness of breath: Secondary | ICD-10-CM | POA: Diagnosis not present

## 2023-04-19 DIAGNOSIS — U071 COVID-19: Secondary | ICD-10-CM | POA: Diagnosis not present

## 2023-04-19 DIAGNOSIS — R0602 Shortness of breath: Secondary | ICD-10-CM | POA: Diagnosis not present

## 2023-04-19 DIAGNOSIS — N179 Acute kidney failure, unspecified: Secondary | ICD-10-CM | POA: Diagnosis not present

## 2023-04-19 DIAGNOSIS — W19XXXA Unspecified fall, initial encounter: Secondary | ICD-10-CM | POA: Diagnosis not present

## 2023-04-19 DIAGNOSIS — R410 Disorientation, unspecified: Secondary | ICD-10-CM | POA: Diagnosis not present

## 2023-04-20 DIAGNOSIS — R1312 Dysphagia, oropharyngeal phase: Secondary | ICD-10-CM | POA: Diagnosis not present

## 2023-04-20 DIAGNOSIS — N184 Chronic kidney disease, stage 4 (severe): Secondary | ICD-10-CM | POA: Diagnosis not present

## 2023-04-20 DIAGNOSIS — U071 COVID-19: Secondary | ICD-10-CM | POA: Diagnosis not present

## 2023-04-20 DIAGNOSIS — W19XXXA Unspecified fall, initial encounter: Secondary | ICD-10-CM | POA: Diagnosis not present

## 2023-04-20 DIAGNOSIS — N179 Acute kidney failure, unspecified: Secondary | ICD-10-CM | POA: Diagnosis not present

## 2023-04-20 DIAGNOSIS — R0602 Shortness of breath: Secondary | ICD-10-CM | POA: Diagnosis not present

## 2023-04-20 DIAGNOSIS — F05 Delirium due to known physiological condition: Secondary | ICD-10-CM | POA: Diagnosis not present

## 2023-04-21 DIAGNOSIS — N179 Acute kidney failure, unspecified: Secondary | ICD-10-CM | POA: Diagnosis not present

## 2023-04-21 DIAGNOSIS — W19XXXA Unspecified fall, initial encounter: Secondary | ICD-10-CM | POA: Diagnosis not present

## 2023-04-21 DIAGNOSIS — R0602 Shortness of breath: Secondary | ICD-10-CM | POA: Diagnosis not present

## 2023-04-21 DIAGNOSIS — U071 COVID-19: Secondary | ICD-10-CM | POA: Diagnosis not present

## 2023-04-22 DIAGNOSIS — R1312 Dysphagia, oropharyngeal phase: Secondary | ICD-10-CM | POA: Diagnosis not present

## 2023-04-22 DIAGNOSIS — R197 Diarrhea, unspecified: Secondary | ICD-10-CM | POA: Diagnosis not present

## 2023-04-23 DIAGNOSIS — R1312 Dysphagia, oropharyngeal phase: Secondary | ICD-10-CM | POA: Diagnosis not present

## 2023-04-23 DIAGNOSIS — R140 Abdominal distension (gaseous): Secondary | ICD-10-CM | POA: Diagnosis not present

## 2023-04-23 DIAGNOSIS — U071 COVID-19: Secondary | ICD-10-CM | POA: Diagnosis not present

## 2023-04-23 DIAGNOSIS — R54 Age-related physical debility: Secondary | ICD-10-CM | POA: Diagnosis not present

## 2023-04-23 DIAGNOSIS — R918 Other nonspecific abnormal finding of lung field: Secondary | ICD-10-CM | POA: Diagnosis not present

## 2023-04-23 DIAGNOSIS — N179 Acute kidney failure, unspecified: Secondary | ICD-10-CM | POA: Diagnosis not present

## 2023-04-24 DIAGNOSIS — R410 Disorientation, unspecified: Secondary | ICD-10-CM | POA: Diagnosis not present

## 2023-04-24 DIAGNOSIS — R54 Age-related physical debility: Secondary | ICD-10-CM | POA: Diagnosis not present

## 2023-04-24 DIAGNOSIS — U071 COVID-19: Secondary | ICD-10-CM | POA: Diagnosis not present

## 2023-04-24 DIAGNOSIS — N179 Acute kidney failure, unspecified: Secondary | ICD-10-CM | POA: Diagnosis not present

## 2023-04-24 DIAGNOSIS — R1312 Dysphagia, oropharyngeal phase: Secondary | ICD-10-CM | POA: Diagnosis not present

## 2023-04-24 DIAGNOSIS — N390 Urinary tract infection, site not specified: Secondary | ICD-10-CM | POA: Diagnosis not present

## 2023-04-24 DIAGNOSIS — R140 Abdominal distension (gaseous): Secondary | ICD-10-CM | POA: Diagnosis not present

## 2023-04-24 DIAGNOSIS — J690 Pneumonitis due to inhalation of food and vomit: Secondary | ICD-10-CM | POA: Diagnosis not present

## 2023-04-25 DIAGNOSIS — R140 Abdominal distension (gaseous): Secondary | ICD-10-CM | POA: Diagnosis not present

## 2023-04-25 DIAGNOSIS — R1084 Generalized abdominal pain: Secondary | ICD-10-CM | POA: Diagnosis not present

## 2023-04-25 DIAGNOSIS — N179 Acute kidney failure, unspecified: Secondary | ICD-10-CM | POA: Diagnosis not present

## 2023-04-25 DIAGNOSIS — R1312 Dysphagia, oropharyngeal phase: Secondary | ICD-10-CM | POA: Diagnosis not present

## 2023-04-25 DIAGNOSIS — R54 Age-related physical debility: Secondary | ICD-10-CM | POA: Diagnosis not present

## 2023-04-26 DIAGNOSIS — R197 Diarrhea, unspecified: Secondary | ICD-10-CM | POA: Diagnosis not present

## 2023-04-26 DIAGNOSIS — N179 Acute kidney failure, unspecified: Secondary | ICD-10-CM | POA: Diagnosis not present

## 2023-04-26 DIAGNOSIS — R54 Age-related physical debility: Secondary | ICD-10-CM | POA: Diagnosis not present

## 2023-04-26 DIAGNOSIS — R1312 Dysphagia, oropharyngeal phase: Secondary | ICD-10-CM | POA: Diagnosis not present

## 2023-04-26 DIAGNOSIS — R140 Abdominal distension (gaseous): Secondary | ICD-10-CM | POA: Diagnosis not present

## 2023-04-27 DIAGNOSIS — R54 Age-related physical debility: Secondary | ICD-10-CM | POA: Diagnosis not present

## 2023-04-27 DIAGNOSIS — N179 Acute kidney failure, unspecified: Secondary | ICD-10-CM | POA: Diagnosis not present

## 2023-04-27 DIAGNOSIS — R1312 Dysphagia, oropharyngeal phase: Secondary | ICD-10-CM | POA: Diagnosis not present

## 2023-04-27 DIAGNOSIS — R140 Abdominal distension (gaseous): Secondary | ICD-10-CM | POA: Diagnosis not present

## 2023-04-28 DIAGNOSIS — R1312 Dysphagia, oropharyngeal phase: Secondary | ICD-10-CM | POA: Diagnosis not present

## 2023-04-28 DIAGNOSIS — R140 Abdominal distension (gaseous): Secondary | ICD-10-CM | POA: Diagnosis not present

## 2023-04-28 DIAGNOSIS — N179 Acute kidney failure, unspecified: Secondary | ICD-10-CM | POA: Diagnosis not present

## 2023-04-28 DIAGNOSIS — R54 Age-related physical debility: Secondary | ICD-10-CM | POA: Diagnosis not present

## 2023-04-29 DIAGNOSIS — N184 Chronic kidney disease, stage 4 (severe): Secondary | ICD-10-CM | POA: Diagnosis not present

## 2023-04-29 DIAGNOSIS — F05 Delirium due to known physiological condition: Secondary | ICD-10-CM | POA: Diagnosis not present

## 2023-04-29 DIAGNOSIS — R1312 Dysphagia, oropharyngeal phase: Secondary | ICD-10-CM | POA: Diagnosis not present

## 2023-04-30 DIAGNOSIS — F05 Delirium due to known physiological condition: Secondary | ICD-10-CM | POA: Diagnosis not present

## 2023-04-30 DIAGNOSIS — R1312 Dysphagia, oropharyngeal phase: Secondary | ICD-10-CM | POA: Diagnosis not present

## 2023-04-30 DIAGNOSIS — D72829 Elevated white blood cell count, unspecified: Secondary | ICD-10-CM | POA: Diagnosis not present

## 2023-04-30 DIAGNOSIS — N184 Chronic kidney disease, stage 4 (severe): Secondary | ICD-10-CM | POA: Diagnosis not present

## 2023-05-01 DIAGNOSIS — D72829 Elevated white blood cell count, unspecified: Secondary | ICD-10-CM | POA: Diagnosis not present

## 2023-05-01 DIAGNOSIS — N184 Chronic kidney disease, stage 4 (severe): Secondary | ICD-10-CM | POA: Diagnosis not present

## 2023-05-01 DIAGNOSIS — F05 Delirium due to known physiological condition: Secondary | ICD-10-CM | POA: Diagnosis not present

## 2023-05-01 DIAGNOSIS — R1312 Dysphagia, oropharyngeal phase: Secondary | ICD-10-CM | POA: Diagnosis not present

## 2023-05-02 DIAGNOSIS — R1312 Dysphagia, oropharyngeal phase: Secondary | ICD-10-CM | POA: Diagnosis not present

## 2023-05-02 DIAGNOSIS — N179 Acute kidney failure, unspecified: Secondary | ICD-10-CM | POA: Diagnosis not present

## 2023-05-02 DIAGNOSIS — N184 Chronic kidney disease, stage 4 (severe): Secondary | ICD-10-CM | POA: Diagnosis not present

## 2023-05-02 DIAGNOSIS — U071 COVID-19: Secondary | ICD-10-CM | POA: Diagnosis not present

## 2023-05-02 DIAGNOSIS — E875 Hyperkalemia: Secondary | ICD-10-CM | POA: Diagnosis not present

## 2023-05-02 DIAGNOSIS — F05 Delirium due to known physiological condition: Secondary | ICD-10-CM | POA: Diagnosis not present

## 2023-05-02 DIAGNOSIS — G9341 Metabolic encephalopathy: Secondary | ICD-10-CM | POA: Diagnosis not present

## 2023-05-03 DIAGNOSIS — E875 Hyperkalemia: Secondary | ICD-10-CM | POA: Diagnosis not present

## 2023-05-03 DIAGNOSIS — N184 Chronic kidney disease, stage 4 (severe): Secondary | ICD-10-CM | POA: Diagnosis not present

## 2023-05-03 DIAGNOSIS — G9341 Metabolic encephalopathy: Secondary | ICD-10-CM | POA: Diagnosis not present

## 2023-05-03 DIAGNOSIS — N179 Acute kidney failure, unspecified: Secondary | ICD-10-CM | POA: Diagnosis not present

## 2023-05-04 DIAGNOSIS — E1122 Type 2 diabetes mellitus with diabetic chronic kidney disease: Secondary | ICD-10-CM | POA: Diagnosis not present

## 2023-05-04 DIAGNOSIS — D631 Anemia in chronic kidney disease: Secondary | ICD-10-CM | POA: Diagnosis not present

## 2023-05-04 DIAGNOSIS — N179 Acute kidney failure, unspecified: Secondary | ICD-10-CM | POA: Diagnosis not present

## 2023-05-04 DIAGNOSIS — N184 Chronic kidney disease, stage 4 (severe): Secondary | ICD-10-CM | POA: Diagnosis not present

## 2023-05-05 DIAGNOSIS — E8729 Other acidosis: Secondary | ICD-10-CM | POA: Diagnosis not present

## 2023-05-05 DIAGNOSIS — N184 Chronic kidney disease, stage 4 (severe): Secondary | ICD-10-CM | POA: Diagnosis not present

## 2023-05-05 DIAGNOSIS — R579 Shock, unspecified: Secondary | ICD-10-CM | POA: Diagnosis not present

## 2023-05-05 DIAGNOSIS — R1312 Dysphagia, oropharyngeal phase: Secondary | ICD-10-CM | POA: Diagnosis not present

## 2023-05-05 DIAGNOSIS — A419 Sepsis, unspecified organism: Secondary | ICD-10-CM | POA: Diagnosis not present

## 2023-05-05 DIAGNOSIS — N179 Acute kidney failure, unspecified: Secondary | ICD-10-CM | POA: Diagnosis not present

## 2023-05-05 DIAGNOSIS — Z4682 Encounter for fitting and adjustment of non-vascular catheter: Secondary | ICD-10-CM | POA: Diagnosis not present

## 2023-05-05 DIAGNOSIS — R4182 Altered mental status, unspecified: Secondary | ICD-10-CM | POA: Diagnosis not present

## 2023-05-05 DIAGNOSIS — I517 Cardiomegaly: Secondary | ICD-10-CM | POA: Diagnosis not present

## 2023-05-05 DIAGNOSIS — R6521 Severe sepsis with septic shock: Secondary | ICD-10-CM | POA: Diagnosis not present

## 2023-05-05 DIAGNOSIS — R140 Abdominal distension (gaseous): Secondary | ICD-10-CM | POA: Diagnosis not present

## 2023-05-05 DIAGNOSIS — I959 Hypotension, unspecified: Secondary | ICD-10-CM | POA: Diagnosis not present

## 2023-05-05 DIAGNOSIS — R918 Other nonspecific abnormal finding of lung field: Secondary | ICD-10-CM | POA: Diagnosis not present

## 2023-06-03 DEATH — deceased
